# Patient Record
Sex: Female | Born: 1935 | Race: White | Hispanic: No | Smoking: Never smoker
Health system: Southern US, Community
[De-identification: ages and names within clinical notes are randomized; demographics above are authoritative.]

## PROBLEM LIST (undated history)

## (undated) DIAGNOSIS — M8580 Other specified disorders of bone density and structure, unspecified site: Secondary | ICD-10-CM

## (undated) DIAGNOSIS — I4891 Unspecified atrial fibrillation: Secondary | ICD-10-CM

## (undated) DIAGNOSIS — I209 Angina pectoris, unspecified: Secondary | ICD-10-CM

## (undated) DIAGNOSIS — F32A Depression, unspecified: Secondary | ICD-10-CM

## (undated) DIAGNOSIS — M1990 Unspecified osteoarthritis, unspecified site: Secondary | ICD-10-CM

## (undated) DIAGNOSIS — R05 Cough: Secondary | ICD-10-CM

## (undated) DIAGNOSIS — R1310 Dysphagia, unspecified: Secondary | ICD-10-CM

## (undated) DIAGNOSIS — E876 Hypokalemia: Secondary | ICD-10-CM

## (undated) DIAGNOSIS — I10 Essential (primary) hypertension: Secondary | ICD-10-CM

## (undated) DIAGNOSIS — F329 Major depressive disorder, single episode, unspecified: Secondary | ICD-10-CM

## (undated) DIAGNOSIS — R260 Ataxic gait: Secondary | ICD-10-CM

## (undated) DIAGNOSIS — R059 Cough, unspecified: Secondary | ICD-10-CM

## (undated) DIAGNOSIS — H3530 Unspecified macular degeneration: Secondary | ICD-10-CM

## (undated) DIAGNOSIS — E785 Hyperlipidemia, unspecified: Secondary | ICD-10-CM

## (undated) DIAGNOSIS — G250 Essential tremor: Secondary | ICD-10-CM

## (undated) DIAGNOSIS — I214 Non-ST elevation (NSTEMI) myocardial infarction: Secondary | ICD-10-CM

## (undated) DIAGNOSIS — K839 Disease of biliary tract, unspecified: Secondary | ICD-10-CM

## (undated) DIAGNOSIS — K219 Gastro-esophageal reflux disease without esophagitis: Secondary | ICD-10-CM

## (undated) DIAGNOSIS — Z780 Asymptomatic menopausal state: Secondary | ICD-10-CM

## (undated) DIAGNOSIS — F419 Anxiety disorder, unspecified: Secondary | ICD-10-CM

## (undated) DIAGNOSIS — J449 Chronic obstructive pulmonary disease, unspecified: Secondary | ICD-10-CM

## (undated) DIAGNOSIS — R296 Repeated falls: Secondary | ICD-10-CM

## (undated) DIAGNOSIS — I2510 Atherosclerotic heart disease of native coronary artery without angina pectoris: Secondary | ICD-10-CM

## (undated) DIAGNOSIS — R531 Weakness: Secondary | ICD-10-CM

## (undated) HISTORY — DX: Repeated falls: R29.6

## (undated) HISTORY — DX: Non-ST elevation (NSTEMI) myocardial infarction: I21.4

## (undated) HISTORY — DX: Unspecified osteoarthritis, unspecified site: M19.90

## (undated) HISTORY — PX: FOOT SURGERY: SHX648

## (undated) HISTORY — DX: Cough: R05

## (undated) HISTORY — PX: INGUINAL HERNIA REPAIR: SUR1180

## (undated) HISTORY — DX: Cough, unspecified: R05.9

## (undated) HISTORY — DX: Hyperlipidemia, unspecified: E78.5

## (undated) HISTORY — DX: Other specified disorders of bone density and structure, unspecified site: M85.80

## (undated) HISTORY — DX: Essential tremor: G25.0

## (undated) HISTORY — PX: BLADDER SUSPENSION: SHX72

## (undated) HISTORY — DX: Asymptomatic menopausal state: Z78.0

## (undated) HISTORY — DX: Unspecified macular degeneration: H35.30

---

## 1995-07-05 DIAGNOSIS — I214 Non-ST elevation (NSTEMI) myocardial infarction: Secondary | ICD-10-CM

## 1995-07-05 HISTORY — DX: Non-ST elevation (NSTEMI) myocardial infarction: I21.4

## 2000-11-28 ENCOUNTER — Ambulatory Visit (HOSPITAL_COMMUNITY): Admission: RE | Admit: 2000-11-28 | Discharge: 2000-11-28 | Payer: Self-pay | Admitting: Obstetrics and Gynecology

## 2000-11-28 ENCOUNTER — Encounter: Payer: Self-pay | Admitting: Obstetrics and Gynecology

## 2001-01-25 ENCOUNTER — Other Ambulatory Visit: Admission: RE | Admit: 2001-01-25 | Discharge: 2001-01-25 | Payer: Self-pay | Admitting: Obstetrics and Gynecology

## 2001-12-25 ENCOUNTER — Ambulatory Visit (HOSPITAL_COMMUNITY): Admission: RE | Admit: 2001-12-25 | Discharge: 2001-12-25 | Payer: Self-pay | Admitting: Obstetrics and Gynecology

## 2001-12-25 ENCOUNTER — Encounter: Payer: Self-pay | Admitting: Obstetrics and Gynecology

## 2002-12-31 ENCOUNTER — Encounter: Payer: Self-pay | Admitting: Obstetrics and Gynecology

## 2002-12-31 ENCOUNTER — Ambulatory Visit (HOSPITAL_COMMUNITY): Admission: RE | Admit: 2002-12-31 | Discharge: 2002-12-31 | Payer: Self-pay | Admitting: Obstetrics and Gynecology

## 2004-11-05 ENCOUNTER — Ambulatory Visit: Payer: Self-pay | Admitting: Cardiology

## 2005-11-08 ENCOUNTER — Ambulatory Visit: Payer: Self-pay | Admitting: Cardiology

## 2005-11-08 ENCOUNTER — Ambulatory Visit (HOSPITAL_COMMUNITY): Admission: RE | Admit: 2005-11-08 | Discharge: 2005-11-08 | Payer: Self-pay | Admitting: Cardiology

## 2006-11-16 ENCOUNTER — Ambulatory Visit: Payer: Self-pay | Admitting: Cardiology

## 2006-12-25 ENCOUNTER — Ambulatory Visit (HOSPITAL_COMMUNITY): Admission: RE | Admit: 2006-12-25 | Discharge: 2006-12-25 | Payer: Self-pay | Admitting: Cardiology

## 2006-12-25 ENCOUNTER — Ambulatory Visit: Payer: Self-pay | Admitting: Cardiology

## 2006-12-26 ENCOUNTER — Ambulatory Visit: Payer: Self-pay | Admitting: Cardiovascular Disease

## 2006-12-27 ENCOUNTER — Ambulatory Visit (HOSPITAL_COMMUNITY): Admission: RE | Admit: 2006-12-27 | Discharge: 2006-12-27 | Payer: Self-pay | Admitting: Cardiology

## 2007-01-22 ENCOUNTER — Ambulatory Visit: Payer: Self-pay | Admitting: Cardiology

## 2007-01-25 ENCOUNTER — Ambulatory Visit (HOSPITAL_COMMUNITY): Admission: RE | Admit: 2007-01-25 | Discharge: 2007-01-25 | Payer: Self-pay | Admitting: Cardiology

## 2007-04-25 ENCOUNTER — Ambulatory Visit: Payer: Self-pay | Admitting: Cardiology

## 2008-05-01 ENCOUNTER — Ambulatory Visit: Payer: Self-pay | Admitting: Cardiology

## 2008-05-01 ENCOUNTER — Encounter (INDEPENDENT_AMBULATORY_CARE_PROVIDER_SITE_OTHER): Payer: Self-pay | Admitting: *Deleted

## 2008-05-01 LAB — CONVERTED CEMR LAB
ALT: 10 units/L
AST: 18 units/L
Albumin: 4.2 g/dL
Alkaline Phosphatase: 95 units/L
BUN: 21 mg/dL
CO2: 25 meq/L
Calcium: 9.4 mg/dL
Chloride: 103 meq/L
Creatinine, Ser: 0.45 mg/dL
Glucose, Bld: 87 mg/dL
HCT: 37.7 %
Hemoglobin: 11.9 g/dL
MCV: 102.4 fL
Platelets: 361 10*3/uL
Potassium: 4.1 meq/L
Sodium: 141 meq/L
TSH: 2.877 microintl units/mL
Total Protein: 6.6 g/dL
WBC: 6.2 10*3/uL

## 2009-05-04 ENCOUNTER — Encounter: Payer: Self-pay | Admitting: *Deleted

## 2009-05-04 DIAGNOSIS — I10 Essential (primary) hypertension: Secondary | ICD-10-CM | POA: Insufficient documentation

## 2009-05-04 DIAGNOSIS — M899 Disorder of bone, unspecified: Secondary | ICD-10-CM | POA: Insufficient documentation

## 2009-05-04 DIAGNOSIS — M949 Disorder of cartilage, unspecified: Secondary | ICD-10-CM

## 2009-05-04 DIAGNOSIS — G250 Essential tremor: Secondary | ICD-10-CM | POA: Insufficient documentation

## 2009-05-04 DIAGNOSIS — M1990 Unspecified osteoarthritis, unspecified site: Secondary | ICD-10-CM | POA: Insufficient documentation

## 2009-05-04 DIAGNOSIS — K219 Gastro-esophageal reflux disease without esophagitis: Secondary | ICD-10-CM

## 2009-05-07 ENCOUNTER — Encounter (INDEPENDENT_AMBULATORY_CARE_PROVIDER_SITE_OTHER): Payer: Self-pay | Admitting: *Deleted

## 2009-05-07 ENCOUNTER — Ambulatory Visit: Payer: Self-pay | Admitting: Cardiology

## 2009-05-07 LAB — CONVERTED CEMR LAB
AST: 20 units/L (ref 0–37)
Albumin: 4.5 g/dL
Albumin: 4.5 g/dL
Alkaline Phosphatase: 53 units/L
BUN: 14 mg/dL (ref 6–23)
CO2: 27 meq/L
CO2: 27 meq/L
Calcium: 9.6 mg/dL
Calcium: 9.6 mg/dL (ref 8.4–10.5)
Chloride: 102 meq/L (ref 96–112)
Cholesterol: 144 mg/dL
Cholesterol: 144 mg/dL
Cholesterol: 144 mg/dL (ref 0–200)
Creatinine, Ser: 0.55 mg/dL (ref 0.40–1.20)
Glucose, Bld: 80 mg/dL
HDL: 54 mg/dL (ref >39)
LDL Cholesterol: 67 mg/dL
Potassium: 4.1 meq/L
Sodium: 143 meq/L
Sodium: 143 meq/L
Total Bilirubin: 0.5 mg/dL (ref 0.3–1.2)
Total CHOL/HDL Ratio: 2.7
Total Protein: 6.7 g/dL
VLDL: 23 mg/dL (ref 0–40)

## 2009-05-11 ENCOUNTER — Encounter (INDEPENDENT_AMBULATORY_CARE_PROVIDER_SITE_OTHER): Payer: Self-pay | Admitting: *Deleted

## 2009-08-05 ENCOUNTER — Ambulatory Visit: Payer: Self-pay | Admitting: Cardiology

## 2009-08-05 ENCOUNTER — Encounter: Payer: Self-pay | Admitting: Cardiology

## 2009-08-06 ENCOUNTER — Encounter: Payer: Self-pay | Admitting: Cardiology

## 2009-08-11 ENCOUNTER — Encounter (INDEPENDENT_AMBULATORY_CARE_PROVIDER_SITE_OTHER): Payer: Self-pay | Admitting: *Deleted

## 2009-08-11 LAB — CONVERTED CEMR LAB
ALT: 63 units/L
AST: 32 units/L
GFR calc non Af Amer: >60 mL/min
Glomerular Filtration Rate, Af Am: >60 mL/min/{1.73_m2}
Glucose, Bld: 102 mg/dL

## 2009-08-12 ENCOUNTER — Encounter (INDEPENDENT_AMBULATORY_CARE_PROVIDER_SITE_OTHER): Payer: Self-pay | Admitting: *Deleted

## 2009-08-12 LAB — CONVERTED CEMR LAB
ALT: 48 units/L
AST: 22 units/L
Albumin: 1.8 g/dL
Alkaline Phosphatase: 1 units/L
Chloride: 99 meq/L
Potassium: 3.4 meq/L
Sodium: 136 meq/L
Total Protein: 4.6 g/dL

## 2009-08-14 ENCOUNTER — Encounter (INDEPENDENT_AMBULATORY_CARE_PROVIDER_SITE_OTHER): Payer: Self-pay | Admitting: *Deleted

## 2009-08-28 ENCOUNTER — Encounter: Payer: Self-pay | Admitting: Cardiology

## 2009-08-28 ENCOUNTER — Encounter (INDEPENDENT_AMBULATORY_CARE_PROVIDER_SITE_OTHER): Payer: Self-pay | Admitting: *Deleted

## 2010-05-03 ENCOUNTER — Encounter (INDEPENDENT_AMBULATORY_CARE_PROVIDER_SITE_OTHER): Payer: Self-pay | Admitting: *Deleted

## 2010-05-07 ENCOUNTER — Ambulatory Visit: Payer: Self-pay | Admitting: Cardiology

## 2010-05-10 DIAGNOSIS — K839 Disease of biliary tract, unspecified: Secondary | ICD-10-CM | POA: Insufficient documentation

## 2010-05-12 ENCOUNTER — Encounter: Payer: Self-pay | Admitting: Cardiology

## 2010-05-13 ENCOUNTER — Ambulatory Visit: Payer: Self-pay | Admitting: Internal Medicine

## 2010-06-10 ENCOUNTER — Ambulatory Visit (HOSPITAL_COMMUNITY)
Admission: RE | Admit: 2010-06-10 | Discharge: 2010-06-10 | Payer: Self-pay | Source: Home / Self Care | Attending: Internal Medicine | Admitting: Internal Medicine

## 2010-06-10 ENCOUNTER — Ambulatory Visit: Payer: Self-pay | Admitting: Internal Medicine

## 2010-07-25 ENCOUNTER — Encounter: Payer: Self-pay | Admitting: Family Medicine

## 2010-07-25 ENCOUNTER — Telehealth: Payer: Self-pay | Admitting: Cardiology

## 2010-08-05 NOTE — Letter (Signed)
Summary: CHEST X-RAY  CHEST X-RAY   Imported By: Faythe Ghee 08/28/2009 10:32:11  _____________________________________________________________________  External Attachment:    Type:   Image     Comment:   External Document

## 2010-08-05 NOTE — Letter (Signed)
Summary: [facility]   labs [facility]   Imported By: Faythe Ghee 05/12/2010 11:50:16  _____________________________________________________________________  External Attachment:    Type:   Image     Comment:   External Document

## 2010-08-05 NOTE — Letter (Signed)
Summary: US ABDOMINAL COMPLETE  US ABDOMINAL COMPLETE   Imported By: Faythe Ghee 08/28/2009 10:27:02  _____________________________________________________________________  External Attachment:    Type:   Image     Comment:   External Document

## 2010-08-05 NOTE — Letter (Signed)
Summary: HOSPITAL [facility] CONSULATION   Imported By: Faythe Ghee 08/19/2009 15:16:19  _____________________________________________________________________  External Attachment:    Type:   Image     Comment:   External Document

## 2010-08-05 NOTE — Miscellaneous (Signed)
Summary: labs cmp,lipids 05/07/2009  Clinical Lists Changes  Observations: Added new observation of CALCIUM: 9.6 mg/dL (04/54/0981 19:14) Added new observation of ALBUMIN: 4.5 g/dL (78/29/5621 30:86) Added new observation of PROTEIN, TOT: 6.7 g/dL (57/84/6962 95:28) Added new observation of SGPT (ALT): 12 units/L (05/07/2009 15:10) Added new observation of SGOT (AST): 20 units/L (05/07/2009 15:10) Added new observation of ALK PHOS: 53 units/L (05/07/2009 15:10) Added new observation of CREATININE: 0.55 mg/dL (41/32/4401 02:72) Added new observation of BUN: 14 mg/dL (53/66/4403 47:42) Added new observation of BG RANDOM: 80 mg/dL (59/56/3875 64:33) Added new observation of CO2 PLSM/SER: 27 meq/L (05/07/2009 15:10) Added new observation of CL SERUM: 102 meq/L (05/07/2009 15:10) Added new observation of K SERUM: 4.1 meq/L (05/07/2009 15:10) Added new observation of NA: 143 meq/L (05/07/2009 15:10) Added new observation of LDL: 67 mg/dL (29/51/8841 66:06) Added new observation of HDL: 54 mg/dL (30/16/0109 32:35) Added new observation of TRIGLYC TOT: 117 mg/dL (57/32/2025 42:70) Added new observation of CHOLESTEROL: 144 mg/dL (62/37/6283 15:17)

## 2010-08-05 NOTE — Miscellaneous (Signed)
Summary: labs 08/12/2009  Clinical Lists Changes  Observations: Added new observation of CALCIUM: 7.6 mg/dL (16/04/9603 5:40) Added new observation of ALBUMIN: 1.8 g/dL (98/05/9146 8:29) Added new observation of PROTEIN, TOT: 4.6 g/dL (56/21/3086 5:78) Added new observation of SGPT (ALT): 48 units/L (08/12/2009 9:06) Added new observation of SGOT (AST): 22 units/L (08/12/2009 9:06) Added new observation of ALK PHOS: 01 units/L (08/12/2009 9:06) Added new observation of GFR AA: >60 mL/min/1.29m2 (08/12/2009 9:06) Added new observation of GFR: >60 mL/min (08/12/2009 9:06) Added new observation of CREATININE: 0.20 mg/dL (46/96/2952 8:41) Added new observation of BUN: 4 mg/dL (32/44/0102 7:25) Added new observation of BG RANDOM: 96 mg/dL (36/64/4034 7:42) Added new observation of CO2 PLSM/SER: 27.0 meq/L (08/12/2009 9:06) Added new observation of CL SERUM: 99 meq/L (08/12/2009 9:06) Added new observation of K SERUM: 3.4 meq/L (08/12/2009 9:06) Added new observation of NA: 136 meq/L (08/12/2009 9:06) Added new observation of CALCIUM: 7.5 mg/dL (59/56/3875 6:43) Added new observation of ALBUMIN: 1.7 g/dL (32/95/1884 1:66) Added new observation of PROTEIN, TOT: 4.4 g/dL (01/01/1600 0:93) Added new observation of SGPT (ALT): 63 units/L (08/11/2009 9:06) Added new observation of SGOT (AST): 32 units/L (08/11/2009 9:06) Added new observation of ALK PHOS: 76 units/L (08/11/2009 9:06) Added new observation of GFR AA: >60 mL/min/1.62m2 (08/11/2009 9:06) Added new observation of GFR: >60 mL/min (08/11/2009 9:06) Added new observation of CREATININE: 0.30 mg/dL (23/55/7322 0:25) Added new observation of BUN: 6 mg/dL (42/70/6237 6:28) Added new observation of BG RANDOM: 102 mg/dL (31/51/7616 0:73) Added new observation of CL SERUM: 100 meq/L (08/11/2009 9:06) Added new observation of K SERUM: 3.6 meq/L (08/11/2009 9:06)

## 2010-08-05 NOTE — Letter (Signed)
Summary: ECHO  ECHO   Imported By: Faythe Ghee 08/28/2009 10:26:11  _____________________________________________________________________  External Attachment:    Type:   Image     Comment:   External Document

## 2010-08-05 NOTE — Letter (Signed)
Summary: ekg  ekg   Imported By: Faythe Ghee 05/12/2010 11:51:40  _____________________________________________________________________  External Attachment:    Type:   Image     Comment:   External Document

## 2010-08-05 NOTE — Letter (Signed)
Summary: ABDOMEN 2 VIEW  ABDOMEN 2 VIEW   Imported By: Faythe Ghee 08/28/2009 10:33:22  _____________________________________________________________________  External Attachment:    Type:   Image     Comment:   External Document

## 2010-08-05 NOTE — Letter (Signed)
Summary: Discharge Summary  Discharge Summary   Imported By: Faythe Ghee 08/19/2009 15:16:39  _____________________________________________________________________  External Attachment:    Type:   Image     Comment:   External Document

## 2010-08-05 NOTE — Assessment & Plan Note (Signed)
Summary: 1 YR F/U PER CHECKOUT ON 05/07/09/TG   Visit Type:  Follow-up Primary Provider:  Dr.Sasser   History of Present Illness: Ms. Candice Meza returns to the office for continued assessment and treatment of possible coronary artery disease and multiple cardiovascular risk factors.  Since I last saw her one year ago, she was admitted to [facility] in February with an Escherichia coli urinary tract infection.  Hospital records were obtained and reviewed.  She also reported chest discomfort and was found to have very mildly elevated troponin peaking at 0.23 with normal renal function.  A question of paroxysmal atrial fibrillation was raised on monitoring, but apparently not documented.  Echocardiogram showed normal EF.  BNP level was 500 with an initial potassium of 2.7.  Chest x-ray showed atelectasis and possible infiltrate at the right base with right pleural effusion, and she was also treated for her chronic obstructive pulmonary disease/acute bronchitis.  A significant anemia was also present.  She subsequently was transferred to the [facility], where she remains.  She has been doing fairly well functionally, but does have some dyspnea on exertion.  She is able to walk independently with a walker.  She denies chest discomfort, lightheadedness, syncope, orthopnea and PND.  Current Medications (verified): 1)  Nabumetone 500 Mg Tabs (Nabumetone) .... Take 1 Tab Two Times A Day 2)  Aspir-Low 81 Mg Tbec (Aspirin) .... Take 1 Tab Daily 3)  Klor-Con 10 10 Meq Cr-Tabs (Potassium Chloride) .... Take 1 Tab Daily 4)  Calcium 600 1500 Mg Tabs (Calcium Carbonate) .... Take 1 Tab Daily 5)  Nitrostat 0.4 Mg Subl (Nitroglycerin) .... Take As Directed For Chestpain 6)  Tylenol 8 Hour 650 Mg Cr-Tabs (Acetaminophen) .... Take As Needed 7)  Alprazolam 0.25 Mg Tabs (Alprazolam) .... Take 1 Tab Daily 8)  Fluticasone Propionate 50 Mcg/act Susp (Fluticasone Propionate) .... 2 Sprays Each Nostril  Daily 9)  Tozal  Caps (Dietary Management Product) .... 3 Caps At Breakfast Daily 10)  Citalopram Hydrobromide 20 Mg Tabs (Citalopram Hydrobromide) .... Take 1 Tab Daily 11)  Cetirizine Hcl 10 Mg Tabs (Cetirizine Hcl) .... Take 1 Tab Daily 12)  Vitamin E 400 Unit Caps (Vitamin E) .... Take 1 Tab Daily 13)  Daily Multi  Tabs (Multiple Vitamins-Minerals) .... Take 1 Tab Daily 14)  Benefiber  Powd (Wheat Dextrin) .... 2 Tablespoons Two Times A Day 15)  Systane 0.4-0.3 % Soln (Polyethyl Glycol-Propyl Glycol) .[name].. 1 Drop Each Eye Three Times A Day 16)  Propranolol Hcl 20 Mg/31ml Soln (Propranolol Hcl) .... Take 1 Tab Qid 17)  Omeprazole 40 Mg Cpdr (Omeprazole) .... Take 1 Tab Daily 18)  Resource Juice Drink (Frozen) Liqd (Nutritional Supplements) .[name].. 1 Juice Daily 19)  Delsym 30 Mg/97ml Lqcr (Dextromethorphan Polistirex) .... Take Prn 20)  Mucinex 600 Mg Xr12h-Tab (Guaifenesin) .... Take Prn 21)  Ambien 5 Mg Tabs (Zolpidem Tartrate) .... Take 1 Tab Qhs 22)  Tramadol Hcl 50 Mg Tabs (Tramadol Hcl) .... Take As Needed For Pain 23)  Phenergan 25 Mg/ml Soln (Promethazine Hcl) .... Use As Needed For Nausea 24)  Pravastatin Sodium 40 Mg Tabs (Pravastatin Sodium) .... Take 1 Tablet By Mouth At Bedtime  Allergies (verified): No Known Drug Allergies  Comments:  Nurse/Medical Assistant: brought med list from [facility] [place]  PFT  Procedure date:  03/09/2010  Findings:      FVC-72% of predicted FEV1: 71% of predicted  Mild and primarily small airways obstruction   Past History:  PMH, FH,  and Social History reviewed and updated.  Review of Systems       See history of present illness.  Vital Signs:  Patient profile:   77 year old female Weight:      119 pounds BMI:     21.84 O2 Sat:      92 % on Room air Pulse rate:   48 / minute BP sitting:   112 / 55  (right arm)  Vitals Entered By: Dreama Saa, CNA (May 07, 2010 1:19 PM)  O2 Flow:  Room air  Physical  Exam  General:  Well developed; no acute distress: pallor noted; mentally sharp, pleasant and conversant.   Neck-No JVD; no carotid bruits: Lungs-No tachypnea, few coarse rales; no rhonchi; no wheezes; decreased breath sounds at the bases Cardiovascular-normal PMI; normal S1 and S2; grade 2/6 systolic ejection murmur; grade 1/6 diastolic decrescendo murmur Abdomen-BS normal; soft and non-tender without masses or organomegaly:  Musculoskeletal-deformities of all of the DIP joints, no cyanosis or clubbing: Neurologic-Normal cranial nerves; symmetric strength and tone:  Skin-Warm, no significant lesions: Extremities- trace edema; 1+ distal pulses; mild tremors; Heberden's nodes     Impression & Recommendations:  Problem # 1:  ATHEROSCLEROTIC CARDIOVASCULAR DISEASE (ICD-429.2) Patient's only catheterization in 1997 showed equivocal results for coronary disease.  Similarly, the myocardial infarction diagnosed during her recent hospitalization was questionable.   No further assessment or treatment is necessary at present  Problem # 2:  ATRIAL FIBRILLATION-PAROXYSMAL (ICD-427.31) I have no documentation of her arrhythmias in the hospital.  These were most likely related to her acute illness and may not recur.  She is a very suboptimal candidate for anticoagulation.  No further evaluation or treatment of this problem will be undertaken at present.  Problem # 3:  HYPERLIPIDEMIA (ICD-272.4) Rosuvastatin was discontinued in hospital due to LFT abnormalities.  We will attempt to reintroduce a statin and follow both LFTs and lipids.  Problem # 4:  HYPERTENSION (ICD-401.9) Blood pressure control is good, and current medications will be continued.  Patient Instructions: 1)  Your physician recommends that you schedule a follow-up appointment in: 9 months 2)  Your physician has recommended you make the following change in your medication: Stop taking Coreg and start taking Pravachol 40mg  by mouth at  bedtime  3)  ***Please elevate legs while seated*** Prescriptions: PRAVASTATIN SODIUM 40 MG TABS (PRAVASTATIN SODIUM) take 1 tablet by mouth at bedtime  #30 x 8   Entered by:   Larita Fife Via LPN   Authorized by:   Kathlen Brunswick, MD, [facility]   Signed by:   Larita Fife Via LPN on 11/91/4782   Method used:   Faxed to ...       [facility] (mail-order)       [address]       [place], [place]  [address]       Ph: [phone] or [phone]       Fax: [phone]   RxID:   [id number]

## 2010-08-05 NOTE — Miscellaneous (Signed)
Summary: labs cmp,lipids 05/07/2009  Clinical Lists Changes  Observations: Added new observation of CALCIUM: 9.6 mg/dL (69/62/9528 4:13) Added new observation of ALBUMIN: 4.5 g/dL (24/40/1027 2:53) Added new observation of PROTEIN, TOT: 6.7 g/dL (66/44/0347 4:25) Added new observation of SGPT (ALT): 12 units/L (05/07/2009 9:47) Added new observation of SGOT (AST): 20 units/L (05/07/2009 9:47) Added new observation of ALK PHOS: 53 units/L (05/07/2009 9:47) Added new observation of CREATININE: 0.55 mg/dL (95/63/8756 4:33) Added new observation of BUN: 14 mg/dL (29/51/8841 6:60) Added new observation of BG RANDOM: 80 mg/dL (63/07/6008 9:32) Added new observation of CO2 PLSM/SER: 27 meq/L (05/07/2009 9:47) Added new observation of CL SERUM: 102 meq/L (05/07/2009 9:47) Added new observation of K SERUM: 4.1 meq/L (05/07/2009 9:47) Added new observation of NA: 143 meq/L (05/07/2009 9:47) Added new observation of LDL: 67 mg/dL (35/57/3220 2:54) Added new observation of HDL: 54 mg/dL (27/12/2374 2:83) Added new observation of TRIGLYC TOT: 117 mg/dL (15/17/6160 7:37) Added new observation of CHOLESTEROL: 144 mg/dL (10/62/6948 5:46)

## 2010-08-05 NOTE — Letter (Signed)
Summary: [facility]   Imported By: Faythe Ghee 05/12/2010 11:54:36  _____________________________________________________________________  External Attachment:    Type:   Image     Comment:   External Document

## 2010-08-05 NOTE — Letter (Signed)
Summary: echo 08/06/09  echo 08/06/09   Imported By: Faythe Ghee 05/12/2010 11:51:20  _____________________________________________________________________  External Attachment:    Type:   Image     Comment:   External Document

## 2010-08-05 NOTE — Letter (Signed)
Summary: chest x ray 08/10/09  chest x ray 08/10/09   Imported By: Faythe Ghee 05/12/2010 11:50:48  _____________________________________________________________________  External Attachment:    Type:   Image     Comment:   External Document

## 2010-08-10 ENCOUNTER — Other Ambulatory Visit: Payer: Self-pay | Admitting: Cardiology

## 2010-08-10 DIAGNOSIS — Z01818 Encounter for other preprocedural examination: Secondary | ICD-10-CM

## 2010-08-13 ENCOUNTER — Encounter: Payer: Self-pay | Admitting: Cardiology

## 2010-08-13 ENCOUNTER — Encounter (HOSPITAL_COMMUNITY): Payer: Self-pay

## 2010-08-13 ENCOUNTER — Encounter (HOSPITAL_COMMUNITY)
Admission: RE | Admit: 2010-08-13 | Discharge: 2010-08-13 | Disposition: A | Discharge status: Home / Self Care | Payer: Medicare Other | Source: Ambulatory Visit | Attending: Cardiology | Admitting: Cardiology

## 2010-08-13 ENCOUNTER — Encounter (HOSPITAL_COMMUNITY): Payer: Medicare Other

## 2010-08-13 ENCOUNTER — Encounter (INDEPENDENT_AMBULATORY_CARE_PROVIDER_SITE_OTHER): Payer: Medicare Other

## 2010-08-13 DIAGNOSIS — R079 Chest pain, unspecified: Secondary | ICD-10-CM

## 2010-08-13 DIAGNOSIS — Z01818 Encounter for other preprocedural examination: Secondary | ICD-10-CM

## 2010-08-13 DIAGNOSIS — R0789 Other chest pain: Secondary | ICD-10-CM | POA: Insufficient documentation

## 2010-08-13 DIAGNOSIS — I2510 Atherosclerotic heart disease of native coronary artery without angina pectoris: Secondary | ICD-10-CM | POA: Insufficient documentation

## 2010-08-13 DIAGNOSIS — D66 Hereditary factor VIII deficiency: Secondary | ICD-10-CM

## 2010-08-13 HISTORY — DX: Essential (primary) hypertension: I10

## 2010-08-13 MED ORDER — TECHNETIUM TC 99M TETROFOSMIN IV KIT
30.0000 | PACK | Freq: Once | INTRAVENOUS | Status: AC | PRN
  Administered 2010-08-13: 28.9 via INTRAVENOUS

## 2010-08-13 MED ORDER — TECHNETIUM TC 99M TETROFOSMIN IV KIT
10.0000 | PACK | Freq: Once | INTRAVENOUS | Status: AC | PRN
  Administered 2010-08-13: 10.2 via INTRAVENOUS

## 2010-08-19 NOTE — Progress Notes (Signed)
Summary: Test Scheduled  Phone Note From Other Clinic   Caller: Dr. Jenne Pane- ENT  [phone] Call For: Dr. Dietrich Pates Details for Reason: Patient discussion Summary of Call: Pt. has a Zenker's Diverticulum and requires repair under general anesthesia.  I advised a stress test prior to surgery and will have our staff set up a lexiscan myoview study. Initial call taken by: Kathlen Brunswick, MD, [facility],  July 25, 2010 10:52 PM  Follow-up for Phone Call        patient scheduled for 08/11/10 / [facility] / tg Follow-up by: Raechel Ache [facility],  August 06, 2010 11:38 AM

## 2010-09-21 ENCOUNTER — Telehealth: Payer: Self-pay | Admitting: Cardiology

## 2010-09-21 NOTE — Telephone Encounter (Signed)
Patient is wanting to know id she has been cleared to have gland surgery. She has never heard back from stress test.

## 2010-11-16 NOTE — Procedures (Signed)
[name], Candice Meza             ACCOUNT NO.:  [id number]   MEDICAL RECORD NO.:  [id number]          PATIENT TYPE:  OUT   LOCATION:  RAD                           FACILITY:  [facility]   PHYSICIAN:  Noralyn Pick. [place] Emms, MD, FACCDATE OF BIRTH:  28-Feb-1922   DATE OF PROCEDURE:  12/27/2006  DATE OF DISCHARGE:                                ECHOCARDIOGRAM   INDICATION:  History of myocardial infarction, hypertension, check left  ventricular function.   Left ventricular cavity size was mildly dilated.  There were no regional  wall motion abnormalities.  There was mild LVH.  Ejection fraction was  60%.  Mitral valve was mildly thickened without significant MR.  There  was mild biatrial enlargement.  Right ventricle was normal with mild TR  and no evidence of pulmonary hypertension.  Aortic valve was slightly  thin and sclerotic.  There was no stenosis.  There was mild aortic  insufficiency.  Subcostal imaging revealed no atrial or septal defect.  No source of embolus.  No effusion.   M-mode measurement showed an aortic dimension of 35 mm, left atrial  dimension of 41 mm.  Septal thickness 12 mm.  LV diastolic dimension 36  mm.  LV systolic dimension 27 mm.   FINAL IMPRESSION:  1. Mild left ventricular cavity enlargement and mild left ventricular      hypertrophy.  Ejection fraction of 60%.  2. Aortic valve sclerosis with mild aortic insufficiency.  3. Normal mitral valve.  4. Mild biatrial enlargement.  5. Normal right ventricle.  6. No pericardial effusion.      Noralyn Pick. Eden Emms, MD, [facility]  Electronically Signed     PCN/MEDQ  D:  12/27/2006  T:  12/28/2006  Job:  [id number]   cc:   Fara Chute  Fax: [phone]   Gerrit Friends. Dietrich Pates, MD, [facility]  [address]  [place], [place] [address]

## 2010-11-16 NOTE — Letter (Signed)
April 25, 2007    Fara Chute, MD  [address]  [place], [place] [address]   RE:  Candice Meza, Candice Meza  MRN:  [id number]  /  DOB:  02-04-1922   Dear Dr. Neita Carp:   Candice Meza return to the office for continued assessment and  treatment of dyspnea.  She is fairly satisfied with her current level of  function but continues to experience breathlessness with one flight of  stairs.  She rests and promptly recovers.  She has PFTs that were  remarkably good with minimal obstruction and normal diffusing capacity  of carbon monoxide and a normal arterial blood gas.  Her BNP level was  normal.  The lipid profile off treatment was suboptimal, as you might  expect.  Apparently, you have recommended that she take Crestor 2.5 mg  daily.   PHYSICAL EXAMINATION:  On exam, a very pleasant older woman in no acute  distress.  The weight is 117, 2 pounds less than three months ago.  Blood pressure  105/70, heart rate 62 and regular, respirations 16.  NECK:  No jugular venous distention; no carotid bruits.  LUNGS:  Clear.  CARDIAC:  Modest systolic ejection murmur; normal first and second heart  sounds.  ABDOMEN:  Soft and nontender; no masses, no organomegaly.  EXTREMITIES:  Ankle edema 1+.   IMPRESSION:  Candice Meza has mild dyspnea that is probably related to  physical deconditioning due to orthopedic problems and tremor.  Her gait  is unstable.  She has suffered falls in the past.  I encouraged her to  be as active as possible but only to walk on level ground  and generally with assistance.  I do not believe that any other  interventions are necessary.  It would be somewhat beneficial for her to  take a lipid-lowering agent.  I would suggest a generic, such as  simvastatin, rather than Crestor, which will cost her 10 times as much  at the pharmacy.  I will plan to see this nice woman again in one year.    Sincerely,      Gerrit Friends. Dietrich Pates, MD, [facility]  Electronically Signed    RMR/MedQ  DD: 04/25/2007  DT: 04/26/2007  Job #: [id number]

## 2010-11-16 NOTE — Letter (Signed)
January 22, 2007    Candice Meza  [address] [place], [place] [address]   RE:  Candice, Meza  MRN:  [id number]  /  DOB:  [birth year]   Dear Dr. Neita Carp:   Candice Meza returns to the office for continued assessment and  treatment of a history of myocardial infarction and now exertional  intolerance.  She feels somewhat better since I last saw her, but  continues to have trouble walking short distances or doing any  significant housework.  She had a D-dimer level that was slightly  elevated at 0.7.  A metabolic profile was performed instead of a BNP  level.  The former was normal.  She had a CBC that was normal, a  chemistry profile that was normal and a TSH that was normal.   With closer questioning, it seems that Candice Meza has had some lung  issues over the years.  She was exposed to a great deal of passive  smoke.  She previously was evaluated by Dr. Juanetta Gosling.  She has had  chronic cough and multiple episodes of pneumonia.  She was once told  that one of her lungs was not elastic enough.   CURRENT MEDICATIONS:  1. KCL 10 mEq daily.  2. Aspirin 81 mg daily.  3. Nexium 40 mg daily.  4. Calcium with Vitamin D.  5. Furosemide 40 mg daily.  6. Nabumetone 500 mg b.i.d.  7. Propranolol 10 mg t.i.d.   On exam, a trim pleasant woman in no acute distress.  The weight is 119,  one pound less than last month.  Blood pressure 130/70, heart rate 66  and regular, respirations 18.  NECK:  No jugular venous distention; normal carotid upstrokes without  bruits.  LUNGS:  Clear; resonant to percussion; no marked scoliosis appreciated  on exam, but she does have an exaggerated lumbar lordosis.  ABDOMEN:  Soft and nontender; no organomegaly.  CARDIAC:  Fourth heart sound present; normal first and second heart  sounds.  EXTREMITIES:  Trace ankle edema; distal pulses intact.   IMPRESSION:  Candice Meza remains symptomatic.  It sounds as if she  may have longstanding asthmatic  bronchitis, which could account for her  current exertional dyspnea.  She has been treated with inhalers in the  past without benefit.  We will obtain pulmonary function testing with  measurement of an arterial blood gas.  This may not lead to any  therapeutic approaches, but it will be useful to know whether abnormal  lung function accounts for her current symptoms.  A BNP level will be  obtained as ordered.  I do not think we need to rule out chronic  pulmonary embolism, based upon a minimally elevated D-dimer level.  [name] will remain off Toprol and off atorvastatin since this has  seemed to help.  I will see this nice woman again in 3 months.    Sincerely,      Gerrit Friends. Dietrich Pates, MD, [facility]  Electronically Signed    RMR/MedQ  DD: 01/22/2007  DT: 01/22/2007  Job #: [id number]

## 2010-11-16 NOTE — Letter (Signed)
Nov 16, 2006    Dr. Fara Chute  [address]  [place], [place] [address]   RE:  Candice, Meza  MRN:  [id number]  /  DOB:  [birth year]   Dear Dr. Neita Carp:   Candice Meza return to the office for continued assessment and  treatment of coronary disease.  She really has not had any problems with  her heart since a non-Q myocardial infarction in 1997.  She has  dyslipidemia that has been extremely well controlled under your care.  She now reports vague symptoms, including malaise, palpitations, and  weakness.  Hypertension has been under good control.  She was evaluated  by Dr. Sandria Manly last year for a tremor.  He added propranolol at a low dose  to her medical therapy, which has made a world of difference.  She has  an intermittent cough that she attributes to allergies, and that is not  particularly troublesome for her.  She has had progressive impairment of  vision related to macular degeneration.   She has multiple allergies, including two SULFA DRUGS, PENICILLIN, and  TETRACYCLINE.   CURRENT MEDICATIONS:  1. Toprol 25 mg daily.  2. KCL 10 mEq daily.  3. Aspirin 81 mg daily.  4. Nexium 40 mg daily.  5. Atorvastatin 10 mg daily.  6. Calcium with vitamin D.  7. Furosemide 40 mg daily.  8. Nabumetone 500 mg b.i.d.  9. Propranolol 10 mg t.i.d.   PHYSICAL EXAMINATION:  GENERAL:  A pleasant, very sharp older woman in  no acute distress.  VITAL SIGNS:  Weight is 120, stable.  Blood pressure 125/75, heart rate  52 and regular, respirations 16.  NECK:  No jugular venous distention; normal carotid upstrokes without  bruits.  SKIN:  Pallor.  HEENT:  Anicteric sclerae; normal oral mucosa.  LUNGS:  A few left basilar rales with decreased breath sounds.  CARDIAC:  Normal first and second heart sounds; fourth heart sound and  modest systolic murmur present.  ABDOMEN:  Soft and nontender; no organomegaly; no masses.  EXTREMITIES:  Ankle edema, 1/2+.  Distal pulses intact.   EKG:  Sinus  bradycardia; normal intervals; borderline low voltage;  nonspecific ST segment abnormalities with ST segment coving in the  anterolateral leads; markedly delayed RV progression.  When compared to  a previous tracing of October 14, 2003, there is little interval change.  ST segment abnormalities are somewhat more prominent on the present  tracing, and R wave progression is worse.   IMPRESSION:  Candice Meza is doing well overall.  She does have a  component of sick sinus syndrome exacerbated by beta blockers.  Since  the propranolol is helping her so much, we will stop her Toprol and hope  that is adequate.   She is somewhat concerned about palpitations.  These range from a  momentary irregularity to either recurrent or repetitive abnormalities  that persist over a considerable period of time.  While I think that a  significant arrhythmia is unlikely, we will proceed with event  recording.  Laboratory to monitor her therapy, including a CBC,  metabolic profile, and TSH as well as to exclude a cause of weakness and  malaise will be obtained.  I will reassess this nice woman in one month.    Sincerely,      Gerrit Friends. Dietrich Pates, MD, [facility]    RMR/MedQ  DD: 11/16/2006  DT: 11/16/2006  Job #: [id number]

## 2010-11-16 NOTE — Letter (Signed)
December 25, 2006    Fara Chute  [address] [place], [place] [address]   RE:  LANDRY, LOOKINGBILL  MRN:  [id number]  /  DOB:  1921/11/13   Dear Dr. Neita Carp:   Ms. Isola returns to the office for continued assessment and  treatment of fatigue and dyspnea.  Basic laboratory studies were  unremarkable, including a normal CBC, normal chemistry profile and  normal TSH.  Despite discontinuing metoprolol, she reports being no  better.  She is able to walk only one half flight of stairs without  resting.  Nonetheless, she does considerable housework during the day.  She describes herself as a people person, but due to her decreased  vision, she is alone most of the day.  She does not report vegetative  depressive signs nor symptoms, except anorexia without significant  weight loss.   MEDICATIONS:  Unchanged from her last visit, except for the  discontinuation of metoprolol.   EXAMINATION:  GENERAL:  On exam, thin pleasant woman in no acute  distress.  VITAL SIGNS:  Her weight is 120, unchanged.  Blood pressure 110/70,  heart rate 75 and regular, respirations 16.  NECK:  No jugular venous distention; no carotid bruits.  LUNGS:  Clear.  Oxygen saturation 92% on room air.  CARDIAC:  Normal first and second heart sounds; modest systolic ejection  murmur.  Fourth heart sound present.  ABDOMEN:  Soft and nontender; no masses; normal bowel sounds; no  organomegaly.  EXTREMITIES:  1+ ankle edema.   IMPRESSION:  Mrs. Bilger is now complaining of more prominent  dyspnea associated with her previous complaints of chronic fatigue and  exercise intolerance.  We will proceed with evaluation, initially to  include chest x-ray, echocardiography, BNP level and D-dimer.  I will  ask her to hold a Atorvastatin, in case that is contributing to her  symptoms and plan to re-evaluate this nice woman in one month.    Sincerely,      Gerrit Friends. Dietrich Pates, MD, [facility]  Electronically Signed    RMR/MedQ  DD: 12/25/2006  DT: 12/25/2006  Job #: [id number]

## 2010-11-16 NOTE — Assessment & Plan Note (Signed)
[facility]                       [place] [facility] OFFICE NOTE   NAME:PENNINGTONMalay, Fantroy                    MRN:          [id number]  DATE:05/01/2008                            DOB:          21-Sep-1921    Ms. Cansler returns to the office for continued assessment and  treatment of hypertension and a history of chest discomfort.  Over the  past year, she has done well from a medical standpoint.  She reports no  chest pain nor dyspnea.  Activity is limited due to markedly impaired  vision.  She also has DJD and osteopenia.  She suffered a severe fall  approximately 3 weeks ago evaluated in the emergency department at  [facility].  No significant injuries were identified.  She has had marked  soreness, particularly in her left leg, which was bruised.  The symptoms  are improving.   MEDICATIONS:  Unchanged from the last year.   PHYSICAL EXAMINATION:  GENERA:  Pleasant older woman in no acute  distress.  VITAL SIGNS:  The weight is 118, 3 pounds less than in May 2007, and 13  pounds less than in 2006.  Blood pressure 100/70, heart rate 65 and  regular, respirations 14.  NECK:  No jugular venous distention; no carotid bruits.  LUNGS:  Coarse breath sounds at the bases.  CARDIAC:  Normal first and second heart sounds; minimal systolic murmur.  ABDOMEN:  Soft and nontender; no masses; normal bowel sounds without  bruits.  EXTREMITIES:  Trace edema; distal pulses intact.   IMPRESSION:  Ms. Wurtz is doing well from a medical standpoint.  Socially, she has some challenges.  She has 2 children, 1 of whom lives  in [place] and the other in the [place].  A brother  lives next to her, but provides her with no assistance.  She does get 9  hours of aide time from [employer].  She receives devices for  the visually impaired; however, she probably would be eligible for some  housing assistance on other social services.  We  will investigate  further resources for her.  She will continue her current medications.  Basic laboratory studies including a CBC, chemistry profile, TSH level,  and lipid profile will be obtained.  Her weight loss is possibly related  to impaired nutrition.  Her aide will investigate meals-on-wheels and  provide her with as much assistance in meeting her nutritional needs as  possible.  I will see this nice woman again in 1 year.     Gerrit Friends. Dietrich Pates, MD, [facility]  Electronically Signed    RMR/MedQ  DD: 05/01/2008  DT: 05/02/2008  Job #: [id number]

## 2010-11-19 NOTE — Procedures (Signed)
Candice Meza, Candice Meza             ACCOUNT NO.:  [id number]   MEDICAL RECORD NO.:  [id number]          PATIENT TYPE:  OUT   LOCATION:  RESP                          FACILITY:  [facility]   PHYSICIAN:  Edward L. Juanetta Gosling, M.D.DATE OF BIRTH:  February 19, 1922   DATE OF PROCEDURE:  DATE OF DISCHARGE:                            PULMONARY FUNCTION TEST   PROCEDURE:  Pulmonary function test.   1. Spirometry shows a normal forced vital capacity and FEV-1 but      perhaps some airflow obstruction which is mild.  2. Lung volumes are normal and, in fact, somewhat higher than      predicted.  3. Diffusion capacity of carbon monoxide was normal.  4. Arterial blood gases normal.      Edward L. Juanetta Gosling, M.D.  Electronically Signed     ELH/MEDQ  D:  02/02/2007  T:  02/02/2007  Job:  [id number]   cc:   Gerrit Friends. Dietrich Pates, MD, [facility]  [address]  [place], [place] [address]

## 2011-03-03 ENCOUNTER — Encounter: Payer: Self-pay | Admitting: Cardiology

## 2011-03-04 ENCOUNTER — Ambulatory Visit: Payer: Medicare Other | Admitting: Cardiology

## 2011-03-08 ENCOUNTER — Ambulatory Visit: Payer: Medicare Other | Admitting: Cardiology

## 2011-03-24 ENCOUNTER — Encounter: Payer: Self-pay | Admitting: Adult Health

## 2011-03-24 ENCOUNTER — Ambulatory Visit (INDEPENDENT_AMBULATORY_CARE_PROVIDER_SITE_OTHER): Payer: Medicare Other | Admitting: Adult Health

## 2011-03-24 DIAGNOSIS — I2510 Atherosclerotic heart disease of native coronary artery without angina pectoris: Secondary | ICD-10-CM

## 2011-03-24 DIAGNOSIS — J40 Bronchitis, not specified as acute or chronic: Secondary | ICD-10-CM

## 2011-03-24 DIAGNOSIS — J449 Chronic obstructive pulmonary disease, unspecified: Secondary | ICD-10-CM | POA: Insufficient documentation

## 2011-03-24 DIAGNOSIS — I10 Essential (primary) hypertension: Secondary | ICD-10-CM

## 2011-03-24 DIAGNOSIS — J4489 Other specified chronic obstructive pulmonary disease: Secondary | ICD-10-CM | POA: Insufficient documentation

## 2011-03-24 NOTE — Progress Notes (Signed)
HPI: [name] is a very pleasant 77 y/o patient of Dr. Dietrich Pates that we are seeing of ongoing assessment and treatment of  CAD with NSTEMI in February of 2012. She had a follow-up stress myoview in Feb of 2012 which resulted as "abnormal revealing a sizeable area of decreased tracer uptake consistent with infarction with mild peri-infarct ischemia.  She had normal EF of 65% and was not consistent with perfusion findings.  She states she did not hear back about stress test after completion and requests info about this now.      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 She lives at the [facility] and is working with Human resources officer for [employer]. She comes today with no significant complaints except that of fatigue chronically. She has frequent congested cough which is worse at night.  She denies chest pain, dizziness or significant shortness of breath.  Allergies  Allergen Reactions  . Morphine And Related   . Penicillins   . Sulfa Antibiotics   . Tetracyclines & Related     Current Outpatient Prescriptions  Medication Sig Dispense Refill  . acetaminophen (TYLENOL) 650 MG CR tablet Take 650 mg by mouth as needed.        . ALPRAZolam (XANAX) 0.25 MG tablet Take 0.25 mg by mouth daily.        [name] aspirin 81 MG tablet Take 81 mg by mouth daily.        . Calcium Carbonate (CALCIUM 600) 1500 MG TABS Take 1 tablet by mouth daily.        . cetirizine (ZYRTEC) 10 MG tablet Take 10 mg by mouth daily.        . citalopram (CELEXA) 20 MG tablet Take 20 mg by mouth daily.        [name] dextromethorphan (DELSYM) 30 MG/5ML liquid Take 60 mg by mouth as needed.        . Dietary Management Product (TOZAL) CAPS Take 3 capsules by mouth daily with breakfast.        . fluticasone (FLONASE) 50 MCG/ACT nasal spray Place 2 sprays into the nose daily.        . furosemide (LASIX) 20 MG tablet Take 20 mg by  mouth daily.        [name] guaiFENesin (MUCINEX) 600 MG 12 hr tablet Take 1,200 mg by mouth as needed.        . Multiple Vitamin (MULTIVITAMIN) tablet Take 1 tablet by mouth daily.        . nabumetone (RELAFEN) 500 MG tablet Take 500 mg by mouth 2 (two) times daily.        . nitroGLYCERIN (NITROSTAT) 0.4 MG SL tablet Place 0.4 mg under the tongue as directed.        . Nutritional Supplements (RESOURCE JUICE DRINK) (FROZEN) LIQD Take 1 Units by mouth daily.        [name] omeprazole (PRILOSEC) 40 MG capsule Take 40 mg by mouth daily.        Bertram Gala Glycol-Propyl Glycol (SYSTANE) 0.4-0.3 % SOLN Apply 1 drop to eye 3 (three) times daily.        . potassium chloride (KLOR-CON) 10 MEQ CR tablet Take 10 mEq by mouth daily.        . pravastatin (PRAVACHOL) 40 MG tablet Take 40 mg by mouth at bedtime.        [name]  promethazine (PHENERGAN) 25 MG tablet Take 25 mg by mouth as needed.        . propranolol (INDERAL) 20 MG tablet Take 20 mg by mouth 4 (four) times daily.        . traMADol (ULTRAM) 50 MG tablet Take 50 mg by mouth as needed.        . vitamin E 400 UNIT capsule Take 400 Units by mouth daily.        . Wheat Dextrin (BENEFIBER) POWD Take by mouth 2 (two) times daily.        [name] zolpidem (AMBIEN) 5 MG tablet Take 5 mg by mouth at bedtime as needed.          Past Medical History  Diagnosis Date  . Hypertension   . Acute non Q wave MI (myocardial infarction), initial episode of care 1997    Questionable lesion in the circumflex at catheterization; normal EF  . Exertional dyspnea     No specific cause identified  . Hyperlipidemia   . Hypertension   . Postmenopausal     Estrogen replacement therapy  . Cough     Prolonged, possible asthmatic bronchitis  . Tremor     Essential  . Osteopenia     Versus osteoporosis  . Macular degeneration   . Degenerative joint disease     versus rheumatoid arthritis  . Falls frequently     Past Surgical History  Procedure Date  . Bladder surgery      Resuspension  . Inguinal hernia repair     Left  . Foot surgery     Bilateral    ZOX:WRUEAV of systems complete and found to be negative unless listed above PHYSICAL EXAM BP 116/64  Pulse 49  Resp 18  Ht 4\' 11"  (1.499 m)  Wt 124 lb 12.8 oz (56.609 kg)  BMI 25.21 kg/m2  SpO2 93% General: Well developed, well nourished, in no acute distress Seated in wheel chair. Head: Eyes PERRLA, No xanthomas.   Normal cephalic and atramatic  Lungs: Bibasilar crackles worse on the right. No wheezes or rhonchi. Heart: HRRR S1 S2, bradycardicwithout MRG.  Pulses are 2+ & equal.            No carotid bruit. No JVD.  No abdominal bruits. No femoral bruits. Abdomen: Bowel sounds are positive, abdomen soft and non-tender without masses or                  Hernia's noted. Msk:  Back normal, normal gait. Normal strength and tone for age. Extremities: No clubbing, cyanosis or edema.  DP +1 Neuro: Alert and oriented X 3. Psych:  Good affect, responds appropriately    ASSESSMENT AND PLAN

## 2011-03-24 NOTE — Assessment & Plan Note (Signed)
She is asymptomatic at present for chest discomfort or pressure. She is bradycardic on this visit at 42 bpm and complains of fatigue. She has had no near syncope or dizziness.  She is on propanolol and this may need to be discontinued if she begins symptoms or HR decreases further.  I will check a CBC, BMET for complaints of fatigue for evaluation of anemia (with diet restrictions) and kidney fx on lasix. She will see Dr. Dietrich Pates in 3 months.

## 2011-03-24 NOTE — Patient Instructions (Signed)
Your physician recommends that you continue on your current medications as directed. Please refer to the Current Medication list given to you today.  Your physician recommends that you schedule a follow-up appointment in: 3 months  

## 2011-03-24 NOTE — Assessment & Plan Note (Signed)
I will increase her Muccinex to BID as opposed to prn as she continues to have congested cough, especially at night when she lies down. Will check a chest x-ray as well along with BNP.  Will follow.

## 2011-04-18 LAB — BLOOD GAS, ARTERIAL
Acid-Base Excess: 1.5
FIO2: 0.21
pCO2 arterial: 40.1
pO2, Arterial: 72.2 — ABNORMAL LOW

## 2011-06-10 ENCOUNTER — Encounter: Payer: Self-pay | Admitting: Cardiology

## 2011-06-24 ENCOUNTER — Encounter: Payer: Self-pay | Admitting: Cardiology

## 2011-06-24 ENCOUNTER — Ambulatory Visit (INDEPENDENT_AMBULATORY_CARE_PROVIDER_SITE_OTHER): Payer: Medicare Other | Admitting: Cardiology

## 2011-06-24 DIAGNOSIS — D649 Anemia, unspecified: Secondary | ICD-10-CM

## 2011-06-24 DIAGNOSIS — H3530 Unspecified macular degeneration: Secondary | ICD-10-CM | POA: Insufficient documentation

## 2011-06-24 DIAGNOSIS — I10 Essential (primary) hypertension: Secondary | ICD-10-CM

## 2011-06-24 DIAGNOSIS — M899 Disorder of bone, unspecified: Secondary | ICD-10-CM

## 2011-06-24 DIAGNOSIS — I214 Non-ST elevation (NSTEMI) myocardial infarction: Secondary | ICD-10-CM

## 2011-06-24 DIAGNOSIS — K839 Disease of biliary tract, unspecified: Secondary | ICD-10-CM

## 2011-06-24 DIAGNOSIS — J4489 Other specified chronic obstructive pulmonary disease: Secondary | ICD-10-CM

## 2011-06-24 DIAGNOSIS — E785 Hyperlipidemia, unspecified: Secondary | ICD-10-CM

## 2011-06-24 DIAGNOSIS — J449 Chronic obstructive pulmonary disease, unspecified: Secondary | ICD-10-CM

## 2011-06-24 DIAGNOSIS — K219 Gastro-esophageal reflux disease without esophagitis: Secondary | ICD-10-CM

## 2011-06-24 DIAGNOSIS — R296 Repeated falls: Secondary | ICD-10-CM | POA: Insufficient documentation

## 2011-06-24 MED ORDER — POTASSIUM CHLORIDE CRYS ER 20 MEQ PO TBCR: 20.0000 meq | EXTENDED_RELEASE_TABLET | Freq: Every day | ORAL | Status: DC

## 2011-06-24 NOTE — Assessment & Plan Note (Addendum)
No lipid profile has been obtained since pravastatin was started last year.  We will remedy that.

## 2011-06-24 NOTE — Progress Notes (Signed)
Patient ID: Candice Meza, female   DOB: 01-Mar-1922, 77 y.o.   MRN: [id number]  HPI: Return visit is scheduled for this delightful octogenarian who I have followed since she suffered an acute myocardial infarction in 1997 without much in the way of atherosclerotic coronary artery disease.  She has done extremely well from a cardiac standpoint, but has chronic problems with asthmatic bronchitis.  She has resided at the [facility] for the past 2 years, not requiring evaluation in the emergency department or hospital admission.  Over the past 2 months, she has noted increased sputum production, cough and difficulty breathing.  She was told that she had pneumonia and has been treated with a number of course of antibiotics.  Repeat chest x-ray is pending.  She has developing mild pedal edema but denies orthopnea, PND, chest discomfort, palpitations, lightheadedness or syncope.  Prior to Admission medications   Medication Sig Start Date End Date Taking? Authorizing Provider  acetaminophen (TYLENOL) 650 MG CR tablet Take 650 mg by mouth as needed.     Yes Historical Provider, MD  albuterol (PROVENTIL HFA;VENTOLIN HFA) 108 (90 BASE) MCG/ACT inhaler Inhale 2 puffs into the lungs every 4 (four) hours as needed.     Yes Historical Provider, MD  ALPRAZolam (XANAX) 0.25 MG tablet Take 0.25 mg by mouth daily.     Yes Historical Provider, MD  aspirin 81 MG tablet Take 81 mg by mouth daily.     Yes Historical Provider, MD  beclomethasone (QVAR) 80 MCG/ACT inhaler Inhale 1 puff into the lungs 2 (two) times daily.     Yes Historical Provider, MD  Calcium Carbonate (CALCIUM 600) 1500 MG TABS Take 1 tablet by mouth daily.     Yes Historical Provider, MD  cetirizine (ZYRTEC) 10 MG tablet Take 10 mg by mouth daily.     Yes Historical Provider, MD  citalopram (CELEXA) 20 MG tablet Take 20 mg by mouth daily.     Yes Historical Provider, MD  dextromethorphan (DELSYM) 30 MG/5ML liquid Take 60 mg by mouth as needed.     Yes  Historical Provider, MD  Dietary Management Product (TOZAL) CAPS Take 3 capsules by mouth daily with breakfast.     Yes Historical Provider, MD  fluticasone (FLONASE) 50 MCG/ACT nasal spray Place 2 sprays into the nose daily.     Yes Historical Provider, MD  furosemide (LASIX) 20 MG tablet Take 20 mg by mouth daily.     Yes Historical Provider, MD  guaiFENesin (MUCINEX) 600 MG 12 hr tablet Take 1,200 mg by mouth as needed.     Yes Historical Provider, MD  Multiple Vitamin (MULTIVITAMIN) tablet Take 1 tablet by mouth daily.     Yes Historical Provider, MD  nabumetone (RELAFEN) 500 MG tablet Take 500 mg by mouth 2 (two) times daily.     Yes Historical Provider, MD  nitroGLYCERIN (NITROSTAT) 0.4 MG SL tablet Place 0.4 mg under the tongue as directed.     Yes Historical Provider, MD  Nutritional Supplements (RESOURCE JUICE DRINK) (FROZEN) LIQD Take 1 Units by mouth daily.     Yes Historical Provider, MD  omeprazole (PRILOSEC) 40 MG capsule Take 40 mg by mouth daily.     Yes Historical Provider, MD  Polyethyl Glycol-Propyl Glycol (SYSTANE) 0.4-0.3 % SOLN Apply 1 drop to eye 3 (three) times daily.     Yes Historical Provider, MD  potassium chloride (KLOR-CON) 10 MEQ CR tablet Take 10 mEq by mouth daily.     Yes  Historical Provider, MD  pravastatin (PRAVACHOL) 40 MG tablet Take 40 mg by mouth at bedtime.     Yes Historical Provider, MD  promethazine (PHENERGAN) 25 MG tablet Take 25 mg by mouth as needed.     Yes Historical Provider, MD  propranolol (INDERAL) 20 MG tablet Take 20 mg by mouth 4 (four) times daily.     Yes Historical Provider, MD  traMADol (ULTRAM) 50 MG tablet Take 50 mg by mouth as needed.     Yes Historical Provider, MD  vitamin E 400 UNIT capsule Take 400 Units by mouth daily.     Yes Historical Provider, MD  Wheat Dextrin (BENEFIBER) POWD Take by mouth 2 (two) times daily.     Yes Historical Provider, MD    Allergies  Allergen Reactions  . Aspirin     GI symptoms  . Morphine And  Related   . Sulfa Antibiotics     dyspnea  . Tetracyclines & Related   . Codeine Rash  . Pamelor Rash  . Penicillins Rash  Past medical history, social history, and family history reviewed and updated.  ROS: See history of present illness.  PHYSICAL EXAM: BP 122/62  Pulse 46  Ht 4' 11.5" (1.511 m)  Wt 55.792 kg (123 lb)  BMI 24.43 kg/m2  General-Well developed; no acute distress Body habitus-proportionate weight and height Neck-No JVD; no carotid bruits Lungs-expiratory rhonchi, particularly at the right base; resonant to percussion; somewhat decreased breath sounds the left base; prolonged expiratory phase Cardiovascular-normal PMI; normal S1 and increased intensity of S2; modest systolic ejection murmur Abdomen-normal bowel sounds; soft and non-tender without masses or organomegaly Musculoskeletal-marked arthritic deformities of fingers on both hands, no cyanosis or clubbing Neurologic-Normal cranial nerves; symmetric strength and tone Skin-Warm, no significant lesions Extremities-distal pulses intact; 1+ ankle and pretibial edema  ASSESSMENT AND PLAN:  [place] Bing, MD 06/24/2011 11:24 AM

## 2011-06-24 NOTE — Assessment & Plan Note (Signed)
Blood pressure control is good; current medication will be continued. 

## 2011-06-24 NOTE — Assessment & Plan Note (Signed)
H&H of 11.9/37.7 in 04/2008 and 10.9/33.4 in 02/2011.  Iron studies will be obtained as well as stool for Hemoccult testing.  If results are not helpful in establishing an etiology, Dr. Neita Carp may wish to perform additional diagnostic testing.

## 2011-06-24 NOTE — Assessment & Plan Note (Signed)
Candice Meza has an area of hypoperfused myocardium in the distribution of her original circumflex event 15 years ago.  In the absence of symptoms and considering the patient's advanced age, no further evaluation or treatment is warranted.

## 2011-06-24 NOTE — Patient Instructions (Signed)
Your physician recommends that you schedule a follow-up appointment in: 7 months  Your physician recommends that you return for lab work in: 3 weeks (FE, TIBC, Lipids, BMET, Ferritin)  Your physician has recommended you make the following change in your medication:  Stop propranolol Increase KCL to 20 meq daily  Stools for hemoccult x 3 and return to the office asap

## 2011-06-24 NOTE — Assessment & Plan Note (Signed)
Excellent medical regime has been prescribed for patient's chronic lung disease.  Once pneumonia has been adequately treated, respiratory status should improve.

## 2011-06-24 NOTE — Assessment & Plan Note (Signed)
Mildly abnormal LFTs with previous evaluation demonstrating minor chronic biliary disease.  Continued observation is appropriate.

## 2011-06-24 NOTE — Assessment & Plan Note (Addendum)
Patient is being treated with a nonsteroidal plus aspirin.  While she derives some protection from treatment with a PPI, she should be followed closely to exclude occult GI blood loss, , especially since she is already anemic.  Stool samples for Hemoccult testing had been requested.

## 2011-07-11 DIAGNOSIS — F411 Generalized anxiety disorder: Secondary | ICD-10-CM | POA: Diagnosis not present

## 2011-07-15 DIAGNOSIS — I4891 Unspecified atrial fibrillation: Secondary | ICD-10-CM | POA: Diagnosis not present

## 2011-07-15 DIAGNOSIS — R5381 Other malaise: Secondary | ICD-10-CM | POA: Diagnosis not present

## 2011-07-15 DIAGNOSIS — I214 Non-ST elevation (NSTEMI) myocardial infarction: Secondary | ICD-10-CM | POA: Diagnosis not present

## 2011-07-15 DIAGNOSIS — I10 Essential (primary) hypertension: Secondary | ICD-10-CM | POA: Diagnosis not present

## 2011-07-17 DIAGNOSIS — J209 Acute bronchitis, unspecified: Secondary | ICD-10-CM | POA: Diagnosis not present

## 2011-07-18 DIAGNOSIS — F411 Generalized anxiety disorder: Secondary | ICD-10-CM | POA: Diagnosis not present

## 2011-07-26 ENCOUNTER — Encounter: Payer: Self-pay | Admitting: Cardiology

## 2011-08-10 DIAGNOSIS — F411 Generalized anxiety disorder: Secondary | ICD-10-CM | POA: Diagnosis not present

## 2011-08-10 DIAGNOSIS — F329 Major depressive disorder, single episode, unspecified: Secondary | ICD-10-CM | POA: Diagnosis not present

## 2011-08-29 DIAGNOSIS — F411 Generalized anxiety disorder: Secondary | ICD-10-CM | POA: Diagnosis not present

## 2011-09-08 DIAGNOSIS — M79609 Pain in unspecified limb: Secondary | ICD-10-CM | POA: Diagnosis not present

## 2011-09-08 DIAGNOSIS — B351 Tinea unguium: Secondary | ICD-10-CM | POA: Diagnosis not present

## 2011-09-09 DIAGNOSIS — I10 Essential (primary) hypertension: Secondary | ICD-10-CM | POA: Diagnosis not present

## 2011-09-09 DIAGNOSIS — I259 Chronic ischemic heart disease, unspecified: Secondary | ICD-10-CM | POA: Diagnosis not present

## 2011-09-09 DIAGNOSIS — E878 Other disorders of electrolyte and fluid balance, not elsewhere classified: Secondary | ICD-10-CM | POA: Diagnosis not present

## 2011-09-09 DIAGNOSIS — N390 Urinary tract infection, site not specified: Secondary | ICD-10-CM | POA: Diagnosis not present

## 2011-09-09 DIAGNOSIS — E46 Unspecified protein-calorie malnutrition: Secondary | ICD-10-CM | POA: Diagnosis not present

## 2011-09-11 DIAGNOSIS — R05 Cough: Secondary | ICD-10-CM | POA: Diagnosis not present

## 2011-09-12 DIAGNOSIS — F411 Generalized anxiety disorder: Secondary | ICD-10-CM | POA: Diagnosis not present

## 2011-10-03 DIAGNOSIS — F411 Generalized anxiety disorder: Secondary | ICD-10-CM | POA: Diagnosis not present

## 2011-10-05 DIAGNOSIS — F329 Major depressive disorder, single episode, unspecified: Secondary | ICD-10-CM | POA: Diagnosis not present

## 2011-10-05 DIAGNOSIS — F411 Generalized anxiety disorder: Secondary | ICD-10-CM | POA: Diagnosis not present

## 2011-10-10 DIAGNOSIS — F411 Generalized anxiety disorder: Secondary | ICD-10-CM | POA: Diagnosis not present

## 2011-10-17 DIAGNOSIS — F411 Generalized anxiety disorder: Secondary | ICD-10-CM | POA: Diagnosis not present

## 2011-10-21 DIAGNOSIS — F411 Generalized anxiety disorder: Secondary | ICD-10-CM | POA: Diagnosis not present

## 2011-10-21 DIAGNOSIS — F329 Major depressive disorder, single episode, unspecified: Secondary | ICD-10-CM | POA: Diagnosis not present

## 2011-10-31 DIAGNOSIS — F411 Generalized anxiety disorder: Secondary | ICD-10-CM | POA: Diagnosis not present

## 2011-11-07 DIAGNOSIS — F411 Generalized anxiety disorder: Secondary | ICD-10-CM | POA: Diagnosis not present

## 2011-11-10 DIAGNOSIS — R05 Cough: Secondary | ICD-10-CM | POA: Diagnosis not present

## 2011-11-14 DIAGNOSIS — F411 Generalized anxiety disorder: Secondary | ICD-10-CM | POA: Diagnosis not present

## 2011-11-23 DIAGNOSIS — F329 Major depressive disorder, single episode, unspecified: Secondary | ICD-10-CM | POA: Diagnosis not present

## 2011-11-23 DIAGNOSIS — F411 Generalized anxiety disorder: Secondary | ICD-10-CM | POA: Diagnosis not present

## 2011-11-28 DIAGNOSIS — M25569 Pain in unspecified knee: Secondary | ICD-10-CM | POA: Diagnosis not present

## 2011-11-28 DIAGNOSIS — M6250 Muscle wasting and atrophy, not elsewhere classified, unspecified site: Secondary | ICD-10-CM | POA: Diagnosis not present

## 2011-11-28 DIAGNOSIS — J189 Pneumonia, unspecified organism: Secondary | ICD-10-CM | POA: Diagnosis not present

## 2011-11-28 DIAGNOSIS — I214 Non-ST elevation (NSTEMI) myocardial infarction: Secondary | ICD-10-CM | POA: Diagnosis not present

## 2011-11-28 DIAGNOSIS — M069 Rheumatoid arthritis, unspecified: Secondary | ICD-10-CM | POA: Diagnosis not present

## 2011-11-28 DIAGNOSIS — R1312 Dysphagia, oropharyngeal phase: Secondary | ICD-10-CM | POA: Diagnosis not present

## 2011-11-28 DIAGNOSIS — K225 Diverticulum of esophagus, acquired: Secondary | ICD-10-CM | POA: Diagnosis not present

## 2011-11-28 DIAGNOSIS — R279 Unspecified lack of coordination: Secondary | ICD-10-CM | POA: Diagnosis not present

## 2011-11-30 DIAGNOSIS — J189 Pneumonia, unspecified organism: Secondary | ICD-10-CM | POA: Diagnosis not present

## 2011-11-30 DIAGNOSIS — I214 Non-ST elevation (NSTEMI) myocardial infarction: Secondary | ICD-10-CM | POA: Diagnosis not present

## 2011-11-30 DIAGNOSIS — K225 Diverticulum of esophagus, acquired: Secondary | ICD-10-CM | POA: Diagnosis not present

## 2011-11-30 DIAGNOSIS — M069 Rheumatoid arthritis, unspecified: Secondary | ICD-10-CM | POA: Diagnosis not present

## 2011-11-30 DIAGNOSIS — R279 Unspecified lack of coordination: Secondary | ICD-10-CM | POA: Diagnosis not present

## 2011-11-30 DIAGNOSIS — R1312 Dysphagia, oropharyngeal phase: Secondary | ICD-10-CM | POA: Diagnosis not present

## 2011-12-01 DIAGNOSIS — M79609 Pain in unspecified limb: Secondary | ICD-10-CM | POA: Diagnosis not present

## 2011-12-01 DIAGNOSIS — B351 Tinea unguium: Secondary | ICD-10-CM | POA: Diagnosis not present

## 2011-12-02 DIAGNOSIS — I214 Non-ST elevation (NSTEMI) myocardial infarction: Secondary | ICD-10-CM | POA: Diagnosis not present

## 2011-12-02 DIAGNOSIS — K225 Diverticulum of esophagus, acquired: Secondary | ICD-10-CM | POA: Diagnosis not present

## 2011-12-02 DIAGNOSIS — M069 Rheumatoid arthritis, unspecified: Secondary | ICD-10-CM | POA: Diagnosis not present

## 2011-12-02 DIAGNOSIS — R279 Unspecified lack of coordination: Secondary | ICD-10-CM | POA: Diagnosis not present

## 2011-12-02 DIAGNOSIS — R1312 Dysphagia, oropharyngeal phase: Secondary | ICD-10-CM | POA: Diagnosis not present

## 2011-12-02 DIAGNOSIS — J189 Pneumonia, unspecified organism: Secondary | ICD-10-CM | POA: Diagnosis not present

## 2011-12-05 DIAGNOSIS — I214 Non-ST elevation (NSTEMI) myocardial infarction: Secondary | ICD-10-CM | POA: Diagnosis not present

## 2011-12-05 DIAGNOSIS — F411 Generalized anxiety disorder: Secondary | ICD-10-CM | POA: Diagnosis not present

## 2011-12-05 DIAGNOSIS — M25649 Stiffness of unspecified hand, not elsewhere classified: Secondary | ICD-10-CM | POA: Diagnosis not present

## 2011-12-05 DIAGNOSIS — I259 Chronic ischemic heart disease, unspecified: Secondary | ICD-10-CM | POA: Diagnosis not present

## 2011-12-05 DIAGNOSIS — I4891 Unspecified atrial fibrillation: Secondary | ICD-10-CM | POA: Diagnosis not present

## 2011-12-05 DIAGNOSIS — K225 Diverticulum of esophagus, acquired: Secondary | ICD-10-CM | POA: Diagnosis not present

## 2011-12-05 DIAGNOSIS — M6250 Muscle wasting and atrophy, not elsewhere classified, unspecified site: Secondary | ICD-10-CM | POA: Diagnosis not present

## 2011-12-05 DIAGNOSIS — M1710 Unilateral primary osteoarthritis, unspecified knee: Secondary | ICD-10-CM | POA: Diagnosis not present

## 2011-12-05 DIAGNOSIS — M069 Rheumatoid arthritis, unspecified: Secondary | ICD-10-CM | POA: Diagnosis not present

## 2011-12-05 DIAGNOSIS — R1312 Dysphagia, oropharyngeal phase: Secondary | ICD-10-CM | POA: Diagnosis not present

## 2011-12-05 DIAGNOSIS — J189 Pneumonia, unspecified organism: Secondary | ICD-10-CM | POA: Diagnosis not present

## 2011-12-05 DIAGNOSIS — R279 Unspecified lack of coordination: Secondary | ICD-10-CM | POA: Diagnosis not present

## 2011-12-05 DIAGNOSIS — R269 Unspecified abnormalities of gait and mobility: Secondary | ICD-10-CM | POA: Diagnosis not present

## 2011-12-06 DIAGNOSIS — I259 Chronic ischemic heart disease, unspecified: Secondary | ICD-10-CM | POA: Diagnosis not present

## 2011-12-06 DIAGNOSIS — J189 Pneumonia, unspecified organism: Secondary | ICD-10-CM | POA: Diagnosis not present

## 2011-12-06 DIAGNOSIS — R0989 Other specified symptoms and signs involving the circulatory and respiratory systems: Secondary | ICD-10-CM | POA: Diagnosis not present

## 2011-12-06 DIAGNOSIS — M069 Rheumatoid arthritis, unspecified: Secondary | ICD-10-CM | POA: Diagnosis not present

## 2011-12-06 DIAGNOSIS — R279 Unspecified lack of coordination: Secondary | ICD-10-CM | POA: Diagnosis not present

## 2011-12-06 DIAGNOSIS — R1312 Dysphagia, oropharyngeal phase: Secondary | ICD-10-CM | POA: Diagnosis not present

## 2011-12-06 DIAGNOSIS — K225 Diverticulum of esophagus, acquired: Secondary | ICD-10-CM | POA: Diagnosis not present

## 2011-12-07 DIAGNOSIS — H04129 Dry eye syndrome of unspecified lacrimal gland: Secondary | ICD-10-CM | POA: Diagnosis not present

## 2011-12-07 DIAGNOSIS — H353190 Nonexudative age-related macular degeneration, unspecified eye, stage unspecified: Secondary | ICD-10-CM | POA: Diagnosis not present

## 2011-12-09 DIAGNOSIS — M069 Rheumatoid arthritis, unspecified: Secondary | ICD-10-CM | POA: Diagnosis not present

## 2011-12-09 DIAGNOSIS — J189 Pneumonia, unspecified organism: Secondary | ICD-10-CM | POA: Diagnosis not present

## 2011-12-09 DIAGNOSIS — K225 Diverticulum of esophagus, acquired: Secondary | ICD-10-CM | POA: Diagnosis not present

## 2011-12-09 DIAGNOSIS — R279 Unspecified lack of coordination: Secondary | ICD-10-CM | POA: Diagnosis not present

## 2011-12-09 DIAGNOSIS — R1312 Dysphagia, oropharyngeal phase: Secondary | ICD-10-CM | POA: Diagnosis not present

## 2011-12-09 DIAGNOSIS — I259 Chronic ischemic heart disease, unspecified: Secondary | ICD-10-CM | POA: Diagnosis not present

## 2011-12-12 DIAGNOSIS — R279 Unspecified lack of coordination: Secondary | ICD-10-CM | POA: Diagnosis not present

## 2011-12-12 DIAGNOSIS — I259 Chronic ischemic heart disease, unspecified: Secondary | ICD-10-CM | POA: Diagnosis not present

## 2011-12-12 DIAGNOSIS — J189 Pneumonia, unspecified organism: Secondary | ICD-10-CM | POA: Diagnosis not present

## 2011-12-12 DIAGNOSIS — K225 Diverticulum of esophagus, acquired: Secondary | ICD-10-CM | POA: Diagnosis not present

## 2011-12-12 DIAGNOSIS — R1312 Dysphagia, oropharyngeal phase: Secondary | ICD-10-CM | POA: Diagnosis not present

## 2011-12-12 DIAGNOSIS — M069 Rheumatoid arthritis, unspecified: Secondary | ICD-10-CM | POA: Diagnosis not present

## 2011-12-13 DIAGNOSIS — M069 Rheumatoid arthritis, unspecified: Secondary | ICD-10-CM | POA: Diagnosis not present

## 2011-12-13 DIAGNOSIS — I259 Chronic ischemic heart disease, unspecified: Secondary | ICD-10-CM | POA: Diagnosis not present

## 2011-12-13 DIAGNOSIS — R279 Unspecified lack of coordination: Secondary | ICD-10-CM | POA: Diagnosis not present

## 2011-12-13 DIAGNOSIS — R1312 Dysphagia, oropharyngeal phase: Secondary | ICD-10-CM | POA: Diagnosis not present

## 2011-12-13 DIAGNOSIS — K225 Diverticulum of esophagus, acquired: Secondary | ICD-10-CM | POA: Diagnosis not present

## 2011-12-13 DIAGNOSIS — J189 Pneumonia, unspecified organism: Secondary | ICD-10-CM | POA: Diagnosis not present

## 2011-12-16 DIAGNOSIS — I259 Chronic ischemic heart disease, unspecified: Secondary | ICD-10-CM | POA: Diagnosis not present

## 2011-12-16 DIAGNOSIS — M069 Rheumatoid arthritis, unspecified: Secondary | ICD-10-CM | POA: Diagnosis not present

## 2011-12-16 DIAGNOSIS — R1312 Dysphagia, oropharyngeal phase: Secondary | ICD-10-CM | POA: Diagnosis not present

## 2011-12-16 DIAGNOSIS — J189 Pneumonia, unspecified organism: Secondary | ICD-10-CM | POA: Diagnosis not present

## 2011-12-16 DIAGNOSIS — R279 Unspecified lack of coordination: Secondary | ICD-10-CM | POA: Diagnosis not present

## 2011-12-16 DIAGNOSIS — K225 Diverticulum of esophagus, acquired: Secondary | ICD-10-CM | POA: Diagnosis not present

## 2011-12-19 DIAGNOSIS — K225 Diverticulum of esophagus, acquired: Secondary | ICD-10-CM | POA: Diagnosis not present

## 2011-12-19 DIAGNOSIS — R1312 Dysphagia, oropharyngeal phase: Secondary | ICD-10-CM | POA: Diagnosis not present

## 2011-12-19 DIAGNOSIS — R279 Unspecified lack of coordination: Secondary | ICD-10-CM | POA: Diagnosis not present

## 2011-12-19 DIAGNOSIS — J189 Pneumonia, unspecified organism: Secondary | ICD-10-CM | POA: Diagnosis not present

## 2011-12-19 DIAGNOSIS — I259 Chronic ischemic heart disease, unspecified: Secondary | ICD-10-CM | POA: Diagnosis not present

## 2011-12-19 DIAGNOSIS — M069 Rheumatoid arthritis, unspecified: Secondary | ICD-10-CM | POA: Diagnosis not present

## 2011-12-20 DIAGNOSIS — R1312 Dysphagia, oropharyngeal phase: Secondary | ICD-10-CM | POA: Diagnosis not present

## 2011-12-20 DIAGNOSIS — R279 Unspecified lack of coordination: Secondary | ICD-10-CM | POA: Diagnosis not present

## 2011-12-20 DIAGNOSIS — I259 Chronic ischemic heart disease, unspecified: Secondary | ICD-10-CM | POA: Diagnosis not present

## 2011-12-20 DIAGNOSIS — M069 Rheumatoid arthritis, unspecified: Secondary | ICD-10-CM | POA: Diagnosis not present

## 2011-12-20 DIAGNOSIS — J189 Pneumonia, unspecified organism: Secondary | ICD-10-CM | POA: Diagnosis not present

## 2011-12-20 DIAGNOSIS — K225 Diverticulum of esophagus, acquired: Secondary | ICD-10-CM | POA: Diagnosis not present

## 2011-12-22 DIAGNOSIS — R1312 Dysphagia, oropharyngeal phase: Secondary | ICD-10-CM | POA: Diagnosis not present

## 2011-12-22 DIAGNOSIS — K225 Diverticulum of esophagus, acquired: Secondary | ICD-10-CM | POA: Diagnosis not present

## 2011-12-22 DIAGNOSIS — J189 Pneumonia, unspecified organism: Secondary | ICD-10-CM | POA: Diagnosis not present

## 2011-12-22 DIAGNOSIS — I259 Chronic ischemic heart disease, unspecified: Secondary | ICD-10-CM | POA: Diagnosis not present

## 2011-12-22 DIAGNOSIS — R279 Unspecified lack of coordination: Secondary | ICD-10-CM | POA: Diagnosis not present

## 2011-12-22 DIAGNOSIS — M069 Rheumatoid arthritis, unspecified: Secondary | ICD-10-CM | POA: Diagnosis not present

## 2011-12-26 DIAGNOSIS — J189 Pneumonia, unspecified organism: Secondary | ICD-10-CM | POA: Diagnosis not present

## 2011-12-26 DIAGNOSIS — I259 Chronic ischemic heart disease, unspecified: Secondary | ICD-10-CM | POA: Diagnosis not present

## 2011-12-26 DIAGNOSIS — R1312 Dysphagia, oropharyngeal phase: Secondary | ICD-10-CM | POA: Diagnosis not present

## 2011-12-26 DIAGNOSIS — K225 Diverticulum of esophagus, acquired: Secondary | ICD-10-CM | POA: Diagnosis not present

## 2011-12-26 DIAGNOSIS — R279 Unspecified lack of coordination: Secondary | ICD-10-CM | POA: Diagnosis not present

## 2011-12-26 DIAGNOSIS — M069 Rheumatoid arthritis, unspecified: Secondary | ICD-10-CM | POA: Diagnosis not present

## 2011-12-27 DIAGNOSIS — R1312 Dysphagia, oropharyngeal phase: Secondary | ICD-10-CM | POA: Diagnosis not present

## 2011-12-27 DIAGNOSIS — K225 Diverticulum of esophagus, acquired: Secondary | ICD-10-CM | POA: Diagnosis not present

## 2011-12-27 DIAGNOSIS — I259 Chronic ischemic heart disease, unspecified: Secondary | ICD-10-CM | POA: Diagnosis not present

## 2011-12-27 DIAGNOSIS — J189 Pneumonia, unspecified organism: Secondary | ICD-10-CM | POA: Diagnosis not present

## 2011-12-27 DIAGNOSIS — R279 Unspecified lack of coordination: Secondary | ICD-10-CM | POA: Diagnosis not present

## 2011-12-27 DIAGNOSIS — M069 Rheumatoid arthritis, unspecified: Secondary | ICD-10-CM | POA: Diagnosis not present

## 2011-12-28 DIAGNOSIS — I259 Chronic ischemic heart disease, unspecified: Secondary | ICD-10-CM | POA: Diagnosis not present

## 2011-12-28 DIAGNOSIS — R279 Unspecified lack of coordination: Secondary | ICD-10-CM | POA: Diagnosis not present

## 2011-12-28 DIAGNOSIS — M069 Rheumatoid arthritis, unspecified: Secondary | ICD-10-CM | POA: Diagnosis not present

## 2011-12-28 DIAGNOSIS — R1312 Dysphagia, oropharyngeal phase: Secondary | ICD-10-CM | POA: Diagnosis not present

## 2011-12-28 DIAGNOSIS — J189 Pneumonia, unspecified organism: Secondary | ICD-10-CM | POA: Diagnosis not present

## 2011-12-28 DIAGNOSIS — K225 Diverticulum of esophagus, acquired: Secondary | ICD-10-CM | POA: Diagnosis not present

## 2011-12-29 DIAGNOSIS — R1312 Dysphagia, oropharyngeal phase: Secondary | ICD-10-CM | POA: Diagnosis not present

## 2011-12-29 DIAGNOSIS — R279 Unspecified lack of coordination: Secondary | ICD-10-CM | POA: Diagnosis not present

## 2011-12-29 DIAGNOSIS — K225 Diverticulum of esophagus, acquired: Secondary | ICD-10-CM | POA: Diagnosis not present

## 2011-12-29 DIAGNOSIS — I259 Chronic ischemic heart disease, unspecified: Secondary | ICD-10-CM | POA: Diagnosis not present

## 2011-12-29 DIAGNOSIS — M069 Rheumatoid arthritis, unspecified: Secondary | ICD-10-CM | POA: Diagnosis not present

## 2011-12-29 DIAGNOSIS — J189 Pneumonia, unspecified organism: Secondary | ICD-10-CM | POA: Diagnosis not present

## 2012-01-02 DIAGNOSIS — R1312 Dysphagia, oropharyngeal phase: Secondary | ICD-10-CM | POA: Diagnosis not present

## 2012-01-02 DIAGNOSIS — M25569 Pain in unspecified knee: Secondary | ICD-10-CM | POA: Diagnosis not present

## 2012-01-02 DIAGNOSIS — I4891 Unspecified atrial fibrillation: Secondary | ICD-10-CM | POA: Diagnosis not present

## 2012-01-02 DIAGNOSIS — M6250 Muscle wasting and atrophy, not elsewhere classified, unspecified site: Secondary | ICD-10-CM | POA: Diagnosis not present

## 2012-01-02 DIAGNOSIS — R269 Unspecified abnormalities of gait and mobility: Secondary | ICD-10-CM | POA: Diagnosis not present

## 2012-01-02 DIAGNOSIS — I259 Chronic ischemic heart disease, unspecified: Secondary | ICD-10-CM | POA: Diagnosis not present

## 2012-01-02 DIAGNOSIS — M1710 Unilateral primary osteoarthritis, unspecified knee: Secondary | ICD-10-CM | POA: Diagnosis not present

## 2012-01-02 DIAGNOSIS — R279 Unspecified lack of coordination: Secondary | ICD-10-CM | POA: Diagnosis not present

## 2012-01-02 DIAGNOSIS — K225 Diverticulum of esophagus, acquired: Secondary | ICD-10-CM | POA: Diagnosis not present

## 2012-01-02 DIAGNOSIS — M069 Rheumatoid arthritis, unspecified: Secondary | ICD-10-CM | POA: Diagnosis not present

## 2012-01-02 DIAGNOSIS — J189 Pneumonia, unspecified organism: Secondary | ICD-10-CM | POA: Diagnosis not present

## 2012-01-02 DIAGNOSIS — I214 Non-ST elevation (NSTEMI) myocardial infarction: Secondary | ICD-10-CM | POA: Diagnosis not present

## 2012-01-02 DIAGNOSIS — M25649 Stiffness of unspecified hand, not elsewhere classified: Secondary | ICD-10-CM | POA: Diagnosis not present

## 2012-01-03 DIAGNOSIS — R269 Unspecified abnormalities of gait and mobility: Secondary | ICD-10-CM | POA: Diagnosis not present

## 2012-01-03 DIAGNOSIS — J189 Pneumonia, unspecified organism: Secondary | ICD-10-CM | POA: Diagnosis not present

## 2012-01-03 DIAGNOSIS — M069 Rheumatoid arthritis, unspecified: Secondary | ICD-10-CM | POA: Diagnosis not present

## 2012-01-03 DIAGNOSIS — R279 Unspecified lack of coordination: Secondary | ICD-10-CM | POA: Diagnosis not present

## 2012-01-03 DIAGNOSIS — R1312 Dysphagia, oropharyngeal phase: Secondary | ICD-10-CM | POA: Diagnosis not present

## 2012-01-03 DIAGNOSIS — K225 Diverticulum of esophagus, acquired: Secondary | ICD-10-CM | POA: Diagnosis not present

## 2012-01-04 DIAGNOSIS — J189 Pneumonia, unspecified organism: Secondary | ICD-10-CM | POA: Diagnosis not present

## 2012-01-04 DIAGNOSIS — R1312 Dysphagia, oropharyngeal phase: Secondary | ICD-10-CM | POA: Diagnosis not present

## 2012-01-04 DIAGNOSIS — R279 Unspecified lack of coordination: Secondary | ICD-10-CM | POA: Diagnosis not present

## 2012-01-04 DIAGNOSIS — M069 Rheumatoid arthritis, unspecified: Secondary | ICD-10-CM | POA: Diagnosis not present

## 2012-01-04 DIAGNOSIS — K225 Diverticulum of esophagus, acquired: Secondary | ICD-10-CM | POA: Diagnosis not present

## 2012-01-04 DIAGNOSIS — R269 Unspecified abnormalities of gait and mobility: Secondary | ICD-10-CM | POA: Diagnosis not present

## 2012-01-06 DIAGNOSIS — R1312 Dysphagia, oropharyngeal phase: Secondary | ICD-10-CM | POA: Diagnosis not present

## 2012-01-06 DIAGNOSIS — K225 Diverticulum of esophagus, acquired: Secondary | ICD-10-CM | POA: Diagnosis not present

## 2012-01-06 DIAGNOSIS — R279 Unspecified lack of coordination: Secondary | ICD-10-CM | POA: Diagnosis not present

## 2012-01-06 DIAGNOSIS — M069 Rheumatoid arthritis, unspecified: Secondary | ICD-10-CM | POA: Diagnosis not present

## 2012-01-06 DIAGNOSIS — R269 Unspecified abnormalities of gait and mobility: Secondary | ICD-10-CM | POA: Diagnosis not present

## 2012-01-06 DIAGNOSIS — J189 Pneumonia, unspecified organism: Secondary | ICD-10-CM | POA: Diagnosis not present

## 2012-01-09 DIAGNOSIS — K225 Diverticulum of esophagus, acquired: Secondary | ICD-10-CM | POA: Diagnosis not present

## 2012-01-09 DIAGNOSIS — J189 Pneumonia, unspecified organism: Secondary | ICD-10-CM | POA: Diagnosis not present

## 2012-01-09 DIAGNOSIS — R269 Unspecified abnormalities of gait and mobility: Secondary | ICD-10-CM | POA: Diagnosis not present

## 2012-01-09 DIAGNOSIS — M069 Rheumatoid arthritis, unspecified: Secondary | ICD-10-CM | POA: Diagnosis not present

## 2012-01-09 DIAGNOSIS — R1312 Dysphagia, oropharyngeal phase: Secondary | ICD-10-CM | POA: Diagnosis not present

## 2012-01-09 DIAGNOSIS — R279 Unspecified lack of coordination: Secondary | ICD-10-CM | POA: Diagnosis not present

## 2012-01-10 DIAGNOSIS — M069 Rheumatoid arthritis, unspecified: Secondary | ICD-10-CM | POA: Diagnosis not present

## 2012-01-10 DIAGNOSIS — R279 Unspecified lack of coordination: Secondary | ICD-10-CM | POA: Diagnosis not present

## 2012-01-10 DIAGNOSIS — J189 Pneumonia, unspecified organism: Secondary | ICD-10-CM | POA: Diagnosis not present

## 2012-01-10 DIAGNOSIS — K225 Diverticulum of esophagus, acquired: Secondary | ICD-10-CM | POA: Diagnosis not present

## 2012-01-10 DIAGNOSIS — R269 Unspecified abnormalities of gait and mobility: Secondary | ICD-10-CM | POA: Diagnosis not present

## 2012-01-10 DIAGNOSIS — R1312 Dysphagia, oropharyngeal phase: Secondary | ICD-10-CM | POA: Diagnosis not present

## 2012-01-11 DIAGNOSIS — R1312 Dysphagia, oropharyngeal phase: Secondary | ICD-10-CM | POA: Diagnosis not present

## 2012-01-11 DIAGNOSIS — R279 Unspecified lack of coordination: Secondary | ICD-10-CM | POA: Diagnosis not present

## 2012-01-11 DIAGNOSIS — M069 Rheumatoid arthritis, unspecified: Secondary | ICD-10-CM | POA: Diagnosis not present

## 2012-01-11 DIAGNOSIS — K225 Diverticulum of esophagus, acquired: Secondary | ICD-10-CM | POA: Diagnosis not present

## 2012-01-11 DIAGNOSIS — R269 Unspecified abnormalities of gait and mobility: Secondary | ICD-10-CM | POA: Diagnosis not present

## 2012-01-11 DIAGNOSIS — J189 Pneumonia, unspecified organism: Secondary | ICD-10-CM | POA: Diagnosis not present

## 2012-01-12 DIAGNOSIS — R1312 Dysphagia, oropharyngeal phase: Secondary | ICD-10-CM | POA: Diagnosis not present

## 2012-01-12 DIAGNOSIS — M069 Rheumatoid arthritis, unspecified: Secondary | ICD-10-CM | POA: Diagnosis not present

## 2012-01-12 DIAGNOSIS — R269 Unspecified abnormalities of gait and mobility: Secondary | ICD-10-CM | POA: Diagnosis not present

## 2012-01-12 DIAGNOSIS — R279 Unspecified lack of coordination: Secondary | ICD-10-CM | POA: Diagnosis not present

## 2012-01-12 DIAGNOSIS — K225 Diverticulum of esophagus, acquired: Secondary | ICD-10-CM | POA: Diagnosis not present

## 2012-01-12 DIAGNOSIS — J189 Pneumonia, unspecified organism: Secondary | ICD-10-CM | POA: Diagnosis not present

## 2012-01-13 DIAGNOSIS — R279 Unspecified lack of coordination: Secondary | ICD-10-CM | POA: Diagnosis not present

## 2012-01-13 DIAGNOSIS — M069 Rheumatoid arthritis, unspecified: Secondary | ICD-10-CM | POA: Diagnosis not present

## 2012-01-13 DIAGNOSIS — K225 Diverticulum of esophagus, acquired: Secondary | ICD-10-CM | POA: Diagnosis not present

## 2012-01-13 DIAGNOSIS — R269 Unspecified abnormalities of gait and mobility: Secondary | ICD-10-CM | POA: Diagnosis not present

## 2012-01-13 DIAGNOSIS — J189 Pneumonia, unspecified organism: Secondary | ICD-10-CM | POA: Diagnosis not present

## 2012-01-13 DIAGNOSIS — R1312 Dysphagia, oropharyngeal phase: Secondary | ICD-10-CM | POA: Diagnosis not present

## 2012-01-16 DIAGNOSIS — K225 Diverticulum of esophagus, acquired: Secondary | ICD-10-CM | POA: Diagnosis not present

## 2012-01-16 DIAGNOSIS — R279 Unspecified lack of coordination: Secondary | ICD-10-CM | POA: Diagnosis not present

## 2012-01-16 DIAGNOSIS — J189 Pneumonia, unspecified organism: Secondary | ICD-10-CM | POA: Diagnosis not present

## 2012-01-16 DIAGNOSIS — R269 Unspecified abnormalities of gait and mobility: Secondary | ICD-10-CM | POA: Diagnosis not present

## 2012-01-16 DIAGNOSIS — M069 Rheumatoid arthritis, unspecified: Secondary | ICD-10-CM | POA: Diagnosis not present

## 2012-01-16 DIAGNOSIS — R1312 Dysphagia, oropharyngeal phase: Secondary | ICD-10-CM | POA: Diagnosis not present

## 2012-01-17 DIAGNOSIS — R1312 Dysphagia, oropharyngeal phase: Secondary | ICD-10-CM | POA: Diagnosis not present

## 2012-01-17 DIAGNOSIS — R279 Unspecified lack of coordination: Secondary | ICD-10-CM | POA: Diagnosis not present

## 2012-01-17 DIAGNOSIS — J189 Pneumonia, unspecified organism: Secondary | ICD-10-CM | POA: Diagnosis not present

## 2012-01-17 DIAGNOSIS — R269 Unspecified abnormalities of gait and mobility: Secondary | ICD-10-CM | POA: Diagnosis not present

## 2012-01-17 DIAGNOSIS — M069 Rheumatoid arthritis, unspecified: Secondary | ICD-10-CM | POA: Diagnosis not present

## 2012-01-17 DIAGNOSIS — K225 Diverticulum of esophagus, acquired: Secondary | ICD-10-CM | POA: Diagnosis not present

## 2012-01-19 DIAGNOSIS — R1312 Dysphagia, oropharyngeal phase: Secondary | ICD-10-CM | POA: Diagnosis not present

## 2012-01-19 DIAGNOSIS — K225 Diverticulum of esophagus, acquired: Secondary | ICD-10-CM | POA: Diagnosis not present

## 2012-01-19 DIAGNOSIS — M069 Rheumatoid arthritis, unspecified: Secondary | ICD-10-CM | POA: Diagnosis not present

## 2012-01-19 DIAGNOSIS — R279 Unspecified lack of coordination: Secondary | ICD-10-CM | POA: Diagnosis not present

## 2012-01-19 DIAGNOSIS — J189 Pneumonia, unspecified organism: Secondary | ICD-10-CM | POA: Diagnosis not present

## 2012-01-19 DIAGNOSIS — R269 Unspecified abnormalities of gait and mobility: Secondary | ICD-10-CM | POA: Diagnosis not present

## 2012-01-20 DIAGNOSIS — R279 Unspecified lack of coordination: Secondary | ICD-10-CM | POA: Diagnosis not present

## 2012-01-20 DIAGNOSIS — K225 Diverticulum of esophagus, acquired: Secondary | ICD-10-CM | POA: Diagnosis not present

## 2012-01-20 DIAGNOSIS — J189 Pneumonia, unspecified organism: Secondary | ICD-10-CM | POA: Diagnosis not present

## 2012-01-20 DIAGNOSIS — R1312 Dysphagia, oropharyngeal phase: Secondary | ICD-10-CM | POA: Diagnosis not present

## 2012-01-20 DIAGNOSIS — M069 Rheumatoid arthritis, unspecified: Secondary | ICD-10-CM | POA: Diagnosis not present

## 2012-01-20 DIAGNOSIS — R269 Unspecified abnormalities of gait and mobility: Secondary | ICD-10-CM | POA: Diagnosis not present

## 2012-01-24 ENCOUNTER — Ambulatory Visit (INDEPENDENT_AMBULATORY_CARE_PROVIDER_SITE_OTHER): Payer: Medicare Other | Admitting: Cardiology

## 2012-01-24 ENCOUNTER — Encounter: Payer: Self-pay | Admitting: Cardiology

## 2012-01-24 VITALS — BP 120/70 | HR 64 | Ht 59.0 in | Wt 121.4 lb

## 2012-01-24 DIAGNOSIS — D649 Anemia, unspecified: Secondary | ICD-10-CM | POA: Diagnosis not present

## 2012-01-24 DIAGNOSIS — I10 Essential (primary) hypertension: Secondary | ICD-10-CM

## 2012-01-24 DIAGNOSIS — R29818 Other symptoms and signs involving the nervous system: Secondary | ICD-10-CM | POA: Diagnosis not present

## 2012-01-24 DIAGNOSIS — R296 Repeated falls: Secondary | ICD-10-CM

## 2012-01-24 DIAGNOSIS — E785 Hyperlipidemia, unspecified: Secondary | ICD-10-CM

## 2012-01-24 NOTE — Progress Notes (Deleted)
Name: Candice Meza    DOB: 1921-09-13  Age: 78 y.o.  MR#: [id number]       PCP:  Estanislado Pandy, MD      Insurance: @PAYORNAME @   CC:   No chief complaint on file.   VS BP 120/70  Pulse 64  Ht 4\' 11"  (1.499 m)  Wt 121 lb 6.4 oz (55.067 kg)  BMI 24.52 kg/m2  Weights Current Weight  01/24/12 121 lb 6.4 oz (55.067 kg)  06/24/11 123 lb (55.792 kg)  03/24/11 124 lb 12.8 oz (56.609 kg)    Blood Pressure  BP Readings from Last 3 Encounters:  01/24/12 120/70  06/24/11 122/62  03/24/11 116/64     Admit date:  (Not on file) Last encounter with RMR:  Visit date not found   Allergy Allergies  Allergen Reactions  . Aspirin     GI symptoms  . Bee Venom   . Morphine And Related   . Sulfa Antibiotics     dyspnea  . Tetracyclines & Related   . Codeine Rash  . Pamelor Rash  . Penicillins Rash    Current Outpatient Prescriptions  Medication Sig Dispense Refill  . acetaminophen (TYLENOL) 650 MG CR tablet Take 650 mg by mouth as needed.        [name] albuterol (PROVENTIL HFA;VENTOLIN HFA) 108 (90 BASE) MCG/ACT inhaler Inhale 2 puffs into the lungs every 4 (four) hours as needed.        [name] albuterol (PROVENTIL) (2.5 MG/3ML) 0.083% nebulizer solution Take 2.5 mg by nebulization every 4 (four) hours as needed.      . ALPRAZolam (XANAX) 0.25 MG tablet Take 0.25 mg by mouth daily.        [name] aspirin 81 MG tablet Take 81 mg by mouth daily.        . beclomethasone (QVAR) 80 MCG/ACT inhaler Inhale 1 puff into the lungs 2 (two) times daily.        . Calcium Carbonate (CALCIUM 600) 1500 MG TABS Take 1 tablet by mouth daily.        . cetirizine (ZYRTEC) 10 MG tablet Take 10 mg by mouth daily.        . citalopram (CELEXA) 20 MG tablet Take 20 mg by mouth daily.        [name] dextromethorphan (DELSYM) 30 MG/5ML liquid Take 60 mg by mouth as needed.        . fluticasone (FLONASE) 50 MCG/ACT nasal spray Place 2 sprays into the nose daily.        . furosemide (LASIX) 20 MG tablet Take 20 mg by mouth daily.         . furosemide (LASIX) 40 MG tablet Take 40 mg by mouth as needed.      [name] guaiFENesin (MUCINEX) 600 MG 12 hr tablet Take 1,200 mg by mouth as needed.        . Multiple Vitamin (MULTIVITAMIN) tablet Take 1 tablet by mouth daily.        . nitroGLYCERIN (NITROSTAT) 0.4 MG SL tablet Place 0.4 mg under the tongue as directed.        [name] omeprazole (PRILOSEC) 40 MG capsule Take 40 mg by mouth daily.        Bertram Gala Glycol-Propyl Glycol (SYSTANE) 0.4-0.3 % SOLN Apply 1 drop to eye 3 (three) times daily.        . potassium chloride SA (K-DUR,KLOR-CON) 20 MEQ tablet Take 1 tablet (20 mEq total) by mouth daily.  90 tablet  3  . pravastatin (PRAVACHOL) 40 MG tablet Take 40 mg by mouth at bedtime.        . promethazine (PHENERGAN) 25 MG tablet Take 25 mg by mouth as needed.        . traMADol (ULTRAM) 50 MG tablet Take 50 mg by mouth as needed.        . vitamin E 400 UNIT capsule Take 400 Units by mouth daily.        . Wheat Dextrin (BENEFIBER) POWD Take by mouth 2 (two) times daily.          Discontinued Meds:    Medications Discontinued During This Encounter  Medication Reason  . Dietary Management Product (TOZAL) CAPS Error  . nabumetone (RELAFEN) 500 MG tablet Error  . Nutritional Supplements (RESOURCE JUICE DRINK) (FROZEN) LIQD Error    Patient Active Problem List  Diagnosis  . HYPERLIPIDEMIA  . HYPERTENSION  . Gastroesophageal reflux disease  . BILIARY TRACT DISORDER  . DEGENERATIVE JOINT DISEASE  . OSTEOPENIA  . Asthmatic bronchitis , chronic  . Repeated falls  . MI, acute, non ST segment elevation  . Macular degeneration  . Anemia    LABS No visits with results within 3 Month(s) from this visit. Latest known visit with results is:  [facility] Conversion Encounter on 08/12/2009  Component Date Value  . Sodium 08/12/2009 136   . Potassium 08/12/2009 3.4   . Chloride 08/12/2009 99   . CO2 08/12/2009 27.0   . Glucose, Bld 08/12/2009 96   . BUN 08/12/2009 4   . Creatinine, Ser  08/12/2009 0.20   . GFR calc non Af Amer 08/12/2009 >60   . Glomerular Filtration Ra* 08/12/2009 >60   . Alkaline Phosphatase 08/12/2009 01   . AST 08/12/2009 22   . ALT 08/12/2009 48   . Total Protein 08/12/2009 4.6   . Albumin 08/12/2009 1.8   . Calcium 08/12/2009 7.6      Results for this Opt Visit:     Results for orders placed in visit on 08/12/09  CONVERTED CEM[facility] LAB      Component Value Range   Sodium 136     Potassium 3.4     Chloride 99     CO2 27.0     Glucose, Bld 96     BUN 4     Creatinine, Ser 0.20     GFR calc non Af Amer >60     Glomerular Filtration Rate, Af Am >60     Alkaline Phosphatase 01     AST 22     ALT 48     Total Protein 4.6     Albumin 1.8     Calcium 7.6      EKG Orders placed in visit on 03/24/11  . EKG 12-LEAD     Prior Assessment and Plan Problem List as of 01/24/2012            Cardiology Problems   HYPERLIPIDEMIA   Last Assessment & Plan Note   06/24/2011 Office Visit Addendum 06/29/2011  6:41 PM by Kathlen Brunswick, MD    No lipid profile has been obtained since pravastatin was started last year.  We will remedy that.    HYPERTENSION   Last Assessment & Plan Note   06/24/2011 Office Visit Signed 06/24/2011  1:17 PM by Kathlen Brunswick, MD    Blood pressure control is good; current medication will be continued.    MI, acute, non ST segment elevation  Last Assessment & Plan Note   06/24/2011 Office Visit Signed 06/24/2011  2:54 PM by Kathlen Brunswick, MD    Ms. Prestage has an area of hypoperfused myocardium in the distribution of her original circumflex event 15 years ago.  In the absence of symptoms and considering the patient's advanced age, no further evaluation or treatment is warranted.      Other   Gastroesophageal reflux disease   Last Assessment & Plan Note   06/24/2011 Office Visit Addendum 06/24/2011  1:16 PM by Kathlen Brunswick, MD    Patient is being treated with a nonsteroidal plus aspirin.  While she  derives some protection from treatment with a PPI, she should be followed closely to exclude occult GI blood loss, , especially since she is already anemic.  Stool samples for Hemoccult testing had been requested.    BILIARY TRACT DISORDER   Last Assessment & Plan Note   06/24/2011 Office Visit Signed 06/24/2011  2:51 PM by Kathlen Brunswick, MD    Mildly abnormal LFTs with previous evaluation demonstrating minor chronic biliary disease.  Continued observation is appropriate.    DEGENERATIVE JOINT DISEASE   OSTEOPENIA   Asthmatic bronchitis , chronic   Last Assessment & Plan Note   06/24/2011 Office Visit Signed 06/24/2011  1:14 PM by Kathlen Brunswick, MD    Excellent medical regime has been prescribed for patient's chronic lung disease.  Once pneumonia has been adequately treated, respiratory status should improve.    Repeated falls   Macular degeneration   Anemia   Last Assessment & Plan Note   06/24/2011 Office Visit Signed 06/24/2011  1:18 PM by Kathlen Brunswick, MD    H&H of 11.9/37.7 in 04/2008 and 10.9/33.4 in 02/2011.  Iron studies will be obtained as well as stool for Hemoccult testing.  If results are not helpful in establishing an etiology, Dr. Neita Carp may wish to perform additional diagnostic testing.        Imaging: No results found.   FRS Calculation: Score not calculated

## 2012-01-24 NOTE — Assessment & Plan Note (Signed)
Control of blood pressure has been excellent both in office and had the [facility].  Current therapy will be continued.

## 2012-01-24 NOTE — Assessment & Plan Note (Signed)
Anemia has resolved without any specific etiology being identified.  Mild macrocytosis persists.

## 2012-01-24 NOTE — Patient Instructions (Addendum)
Your physician recommends that you schedule a follow-up appointment in: 1 year  

## 2012-01-24 NOTE — Assessment & Plan Note (Signed)
Hyperlipidemia remains under excellent control with current modest therapy, which will be continued.

## 2012-01-24 NOTE — Assessment & Plan Note (Signed)
No falls since entering the [facility] in 2009

## 2012-01-24 NOTE — Progress Notes (Signed)
Patient ID: Candice Meza, female   DOB: 1921/11/18, 78 y.o.   MRN: [id number]  HPI: Annual visit for this delightful woman with multiple cardiovascular risk factors, previous myocardial infarction but only modest coronary disease on coronary angiography in 1997.  Since that time, she has had no further cardiac problems.  Hypertension and hyperlipidemia are well controlled.  She enjoys generally good health, but suffered multiple falls a few years ago and has resided safely in a nursing facility since.  Recently, she has had increasing difficulty with ambulation and is now receiving physical therapy, but does not ambulate independently.  Her only significant medical issue over the past 12 months was an episode of pneumonia treated as an outpatient.  Prior to Admission medications   Medication Sig Start Date End Date Taking? Authorizing Provider  acetaminophen (TYLENOL) 650 MG CR tablet Take 650 mg by mouth as needed.     Yes Historical Provider, MD  albuterol (PROVENTIL HFA;VENTOLIN HFA) 108 (90 BASE) MCG/ACT inhaler Inhale 2 puffs into the lungs every 4 (four) hours as needed.     Yes Historical Provider, MD  albuterol (PROVENTIL) (2.5 MG/3ML) 0.083% nebulizer solution Take 2.5 mg by nebulization every 4 (four) hours as needed.   Yes Historical Provider, MD  ALPRAZolam (XANAX) 0.25 MG tablet Take 0.25 mg by mouth daily.     Yes Historical Provider, MD  aspirin 81 MG tablet Take 81 mg by mouth daily.     Yes Historical Provider, MD  beclomethasone (QVAR) 80 MCG/ACT inhaler Inhale 1 puff into the lungs 2 (two) times daily.     Yes Historical Provider, MD  Calcium Carbonate (CALCIUM 600) 1500 MG TABS Take 1 tablet by mouth daily.     Yes Historical Provider, MD  cetirizine (ZYRTEC) 10 MG tablet Take 10 mg by mouth daily.     Yes Historical Provider, MD  citalopram (CELEXA) 20 MG tablet Take 20 mg by mouth daily.     Yes Historical Provider, MD  dextromethorphan (DELSYM) 30 MG/5ML liquid Take 60 mg by  mouth as needed.     Yes Historical Provider, MD  fluticasone (FLONASE) 50 MCG/ACT nasal spray Place 2 sprays into the nose daily.     Yes Historical Provider, MD  furosemide (LASIX) 20 MG tablet Take 20 mg by mouth daily.     Yes Historical Provider, MD  furosemide (LASIX) 40 MG tablet Take 40 mg by mouth as needed.   Yes Historical Provider, MD  guaiFENesin (MUCINEX) 600 MG 12 hr tablet Take 1,200 mg by mouth as needed.     Yes Historical Provider, MD  Multiple Vitamin (MULTIVITAMIN) tablet Take 1 tablet by mouth daily.     Yes Historical Provider, MD  nitroGLYCERIN (NITROSTAT) 0.4 MG SL tablet Place 0.4 mg under the tongue as directed.     Yes Historical Provider, MD  omeprazole (PRILOSEC) 40 MG capsule Take 40 mg by mouth daily.     Yes Historical Provider, MD  Polyethyl Glycol-Propyl Glycol (SYSTANE) 0.4-0.3 % SOLN Apply 1 drop to eye 3 (three) times daily.     Yes Historical Provider, MD  potassium chloride SA (K-DUR,KLOR-CON) 20 MEQ tablet Take 1 tablet (20 mEq total) by mouth daily. 06/24/11 06/23/12 Yes Kathlen Brunswick, MD  pravastatin (PRAVACHOL) 40 MG tablet Take 40 mg by mouth at bedtime.     Yes Historical Provider, MD  promethazine (PHENERGAN) 25 MG tablet Take 25 mg by mouth as needed.     Yes Historical Provider, MD  traMADol (ULTRAM) 50 MG tablet Take 50 mg by mouth as needed.     Yes Historical Provider, MD  vitamin E 400 UNIT capsule Take 400 Units by mouth daily.     Yes Historical Provider, MD  Wheat Dextrin (BENEFIBER) POWD Take by mouth 2 (two) times daily.     Yes Historical Provider, MD   Allergies  Allergen Reactions  . Aspirin     GI symptoms  . Bee Venom   . Morphine And Related   . Sulfa Antibiotics     dyspnea  . Tetracyclines & Related   . Codeine Rash  . Pamelor Rash  . Penicillins Rash     Past medical history, social history, and family history reviewed and updated.  ROS: Denies chest discomfort, dyspnea, orthopnea, PND, lightheadedness or syncope.   She has mild chronic pedal edema with rare exacerbations.  She requires an extra dose of furosemide only once in a great while.  No nausea, emesis, constipation or diarrhea.  All other systems reviewed and are negative.  PHYSICAL EXAM: BP 120/70  Pulse 64  Ht 4\' 11"  (1.499 m)  Wt 55.067 kg (121 lb 6.4 oz)  BMI 24.52 kg/m2  General-Well developed; no acute distress Body habitus-proportionate weight and height Neck-No JVD; Faint bilateral carotid bruits vs transmitted murmur Lungs-Bibasilar rales, partially clear with cough; resonant to percussion Cardiovascular-normal PMI; normal S1 and S2; Grade 1-2/6 systolic ejection murmur at the cardiac base; grade 1/6 diastolic blowing murmur at the upper left sternal border Abdomen-normal bowel sounds; soft and non-tender without masses or organomegaly Musculoskeletal-No deformities, no cyanosis or clubbing Neurologic-Normal cranial nerves; symmetric strength and tone Skin-Warm, no significant lesions Extremities-distal pulses intact; 1+ pretibial and ankle edema  ASSESSMENT AND PLAN:  [place] Bing, MD 01/24/2012 2:27 PM

## 2012-01-30 DIAGNOSIS — R279 Unspecified lack of coordination: Secondary | ICD-10-CM | POA: Diagnosis not present

## 2012-01-30 DIAGNOSIS — M069 Rheumatoid arthritis, unspecified: Secondary | ICD-10-CM | POA: Diagnosis not present

## 2012-01-30 DIAGNOSIS — J189 Pneumonia, unspecified organism: Secondary | ICD-10-CM | POA: Diagnosis not present

## 2012-01-30 DIAGNOSIS — R269 Unspecified abnormalities of gait and mobility: Secondary | ICD-10-CM | POA: Diagnosis not present

## 2012-01-30 DIAGNOSIS — K225 Diverticulum of esophagus, acquired: Secondary | ICD-10-CM | POA: Diagnosis not present

## 2012-01-30 DIAGNOSIS — R1312 Dysphagia, oropharyngeal phase: Secondary | ICD-10-CM | POA: Diagnosis not present

## 2012-02-23 DIAGNOSIS — B351 Tinea unguium: Secondary | ICD-10-CM | POA: Diagnosis not present

## 2012-02-23 DIAGNOSIS — M79609 Pain in unspecified limb: Secondary | ICD-10-CM | POA: Diagnosis not present

## 2012-02-26 DIAGNOSIS — E7800 Pure hypercholesterolemia, unspecified: Secondary | ICD-10-CM | POA: Diagnosis not present

## 2012-03-09 DIAGNOSIS — E7800 Pure hypercholesterolemia, unspecified: Secondary | ICD-10-CM | POA: Diagnosis not present

## 2012-03-09 DIAGNOSIS — Z79899 Other long term (current) drug therapy: Secondary | ICD-10-CM | POA: Diagnosis not present

## 2012-03-09 DIAGNOSIS — I10 Essential (primary) hypertension: Secondary | ICD-10-CM | POA: Diagnosis not present

## 2012-04-15 DIAGNOSIS — N390 Urinary tract infection, site not specified: Secondary | ICD-10-CM | POA: Diagnosis not present

## 2012-04-17 ENCOUNTER — Encounter (HOSPITAL_COMMUNITY): Payer: Self-pay

## 2012-04-17 ENCOUNTER — Inpatient Hospital Stay (HOSPITAL_COMMUNITY)
Admission: EM | Admit: 2012-04-17 | Discharge: 2012-04-18 | DRG: 445 | Disposition: A | Discharge status: Other Acute Inpatient Hospital | Payer: Medicare Other | Attending: Internal Medicine | Admitting: Internal Medicine

## 2012-04-17 ENCOUNTER — Emergency Department (HOSPITAL_COMMUNITY): Payer: Medicare Other

## 2012-04-17 DIAGNOSIS — K225 Diverticulum of esophagus, acquired: Secondary | ICD-10-CM | POA: Diagnosis not present

## 2012-04-17 DIAGNOSIS — Z823 Family history of stroke: Secondary | ICD-10-CM | POA: Diagnosis not present

## 2012-04-17 DIAGNOSIS — Z8249 Family history of ischemic heart disease and other diseases of the circulatory system: Secondary | ICD-10-CM | POA: Diagnosis not present

## 2012-04-17 DIAGNOSIS — Z87440 Personal history of urinary (tract) infections: Secondary | ICD-10-CM | POA: Diagnosis not present

## 2012-04-17 DIAGNOSIS — K449 Diaphragmatic hernia without obstruction or gangrene: Secondary | ICD-10-CM | POA: Diagnosis not present

## 2012-04-17 DIAGNOSIS — E785 Hyperlipidemia, unspecified: Secondary | ICD-10-CM | POA: Diagnosis present

## 2012-04-17 DIAGNOSIS — R1110 Vomiting, unspecified: Secondary | ICD-10-CM

## 2012-04-17 DIAGNOSIS — K5710 Diverticulosis of small intestine without perforation or abscess without bleeding: Secondary | ICD-10-CM | POA: Diagnosis present

## 2012-04-17 DIAGNOSIS — M899 Disorder of bone, unspecified: Secondary | ICD-10-CM | POA: Diagnosis present

## 2012-04-17 DIAGNOSIS — N390 Urinary tract infection, site not specified: Secondary | ICD-10-CM | POA: Diagnosis not present

## 2012-04-17 DIAGNOSIS — I252 Old myocardial infarction: Secondary | ICD-10-CM | POA: Diagnosis not present

## 2012-04-17 DIAGNOSIS — Z880 Allergy status to penicillin: Secondary | ICD-10-CM | POA: Diagnosis not present

## 2012-04-17 DIAGNOSIS — M549 Dorsalgia, unspecified: Secondary | ICD-10-CM | POA: Diagnosis not present

## 2012-04-17 DIAGNOSIS — R7402 Elevation of levels of lactic acid dehydrogenase (LDH): Secondary | ICD-10-CM | POA: Diagnosis not present

## 2012-04-17 DIAGNOSIS — K8309 Other cholangitis: Principal | ICD-10-CM | POA: Diagnosis present

## 2012-04-17 DIAGNOSIS — Z882 Allergy status to sulfonamides status: Secondary | ICD-10-CM

## 2012-04-17 DIAGNOSIS — K8020 Calculus of gallbladder without cholecystitis without obstruction: Secondary | ICD-10-CM | POA: Diagnosis not present

## 2012-04-17 DIAGNOSIS — I2510 Atherosclerotic heart disease of native coronary artery without angina pectoris: Secondary | ICD-10-CM | POA: Diagnosis present

## 2012-04-17 DIAGNOSIS — R112 Nausea with vomiting, unspecified: Secondary | ICD-10-CM | POA: Diagnosis not present

## 2012-04-17 DIAGNOSIS — R109 Unspecified abdominal pain: Secondary | ICD-10-CM | POA: Diagnosis not present

## 2012-04-17 DIAGNOSIS — K838 Other specified diseases of biliary tract: Secondary | ICD-10-CM | POA: Diagnosis not present

## 2012-04-17 DIAGNOSIS — J986 Disorders of diaphragm: Secondary | ICD-10-CM | POA: Diagnosis not present

## 2012-04-17 DIAGNOSIS — N281 Cyst of kidney, acquired: Secondary | ICD-10-CM | POA: Diagnosis not present

## 2012-04-17 DIAGNOSIS — K839 Disease of biliary tract, unspecified: Secondary | ICD-10-CM | POA: Diagnosis present

## 2012-04-17 DIAGNOSIS — I10 Essential (primary) hypertension: Secondary | ICD-10-CM | POA: Diagnosis present

## 2012-04-17 LAB — CBC WITH DIFFERENTIAL/PLATELET
Basophils Relative: 0 % (ref 0–1)
Eosinophils Absolute: 0 10*3/uL (ref 0.0–0.7)
Eosinophils Relative: 0 % (ref 0–5)
Lymphs Abs: 0.5 10*3/uL — ABNORMAL LOW (ref 0.7–4.0)
MCH: 34.2 pg — ABNORMAL HIGH (ref 26.0–34.0)
MCHC: 34.5 g/dL (ref 30.0–36.0)
MCV: 99.2 fL (ref 78.0–100.0)
Monocytes Relative: 11 % (ref 3–12)
Neutrophils Relative %: 79 % — ABNORMAL HIGH (ref 43–77)
Platelets: 147 10*3/uL — ABNORMAL LOW (ref 150–400)

## 2012-04-17 LAB — COMPREHENSIVE METABOLIC PANEL
ALT: 292 U/L — ABNORMAL HIGH (ref 0–35)
Albumin: 3.1 g/dL — ABNORMAL LOW (ref 3.5–5.2)
Alkaline Phosphatase: 352 U/L — ABNORMAL HIGH (ref 39–117)
BUN: 20 mg/dL (ref 6–23)
Calcium: 9.3 mg/dL (ref 8.4–10.5)
GFR calc Af Amer: >90 mL/min (ref >90)
Glucose, Bld: 141 mg/dL — ABNORMAL HIGH (ref 70–99)
Potassium: 3.4 mEq/L — ABNORMAL LOW (ref 3.5–5.1)
Sodium: 136 mEq/L (ref 135–145)
Total Protein: 6.7 g/dL (ref 6.0–8.3)

## 2012-04-17 LAB — URINALYSIS, ROUTINE W REFLEX MICROSCOPIC
Leukocytes, UA: NEGATIVE
Nitrite: POSITIVE — AB
Specific Gravity, Urine: 1.025 (ref 1.005–1.030)
Urobilinogen, UA: 4 mg/dL — ABNORMAL HIGH (ref 0.0–1.0)
pH: 6 (ref 5.0–8.0)

## 2012-04-17 LAB — URINE MICROSCOPIC-ADD ON

## 2012-04-17 LAB — LIPASE, BLOOD: Lipase: 25 U/L (ref 11–59)

## 2012-04-17 MED ORDER — SODIUM CHLORIDE 0.9 % IV SOLN
Freq: Once | INTRAVENOUS | Status: AC
  Administered 2012-04-17: 22:00:00 via INTRAVENOUS

## 2012-04-17 MED ORDER — PANTOPRAZOLE SODIUM 40 MG IV SOLR
40.0000 mg | Freq: Once | INTRAVENOUS | Status: AC
  Administered 2012-04-17: 40 mg via INTRAVENOUS
  Filled 2012-04-17: qty 40

## 2012-04-17 MED ORDER — ONDANSETRON HCL 4 MG/2ML IJ SOLN
4.0000 mg | Freq: Once | INTRAMUSCULAR | Status: AC
  Administered 2012-04-17: 4 mg via INTRAVENOUS
  Filled 2012-04-17: qty 2

## 2012-04-17 NOTE — ED Notes (Signed)
Pt started on cipro this am for uti that was diagnosed yesterday,  States she has been nauseated and has vomited several times, also c/o upper abd pain

## 2012-04-17 NOTE — ED Provider Notes (Signed)
History   This chart was scribed for Benny Lennert, MD by Gerlean Ren. This patient was seen in room APA09/APA09 and the patient's care was started at 21:41.   CSN: [id number]  Arrival date & time 04/17/12  2019   First MD Initiated Contact with Patient 04/17/12 2140      Chief Complaint  Patient presents with  . Emesis    (Consider location/radiation/quality/duration/timing/severity/associated sxs/prior treatment) The history is provided by the patient. No language interpreter was used.   Candice Meza is a 78 y.o. female who presents to the Emergency Department complaining of constant nausea and numerous episodes of non-bloody, non-bilious emesis with associated abdominal "swelling and fullness" but no abdominal pain.  Pt was started on Cipro regiment this morning for bladder infection dx yesterday.  Pt denies any irregular bowel movements, fever, neck pain, sore throat, visual disturbance, CP, cough, dyspnea, urinary symptoms, back pain, HA, weakness, numbness and rash as associated symptoms.  Pt does not take O2 at home.  Pt has h/o MI and HTN.  Pt denies tobacco and alcohol use.     Past Medical History  Diagnosis Date  . MI, acute, non ST segment elevation 1997    Questionable lesion in the circumflex at catheterization; normal EF; chronic dyspnea without specific cause identified  . Hyperlipidemia   . Hypertension   . Postmenopausal     Estrogen replacement therapy  . Cough     Prolonged, possible asthmatic bronchitis  . Benign essential tremor   . Osteopenia     Versus osteoporosis  . Macular degeneration   . Degenerative joint disease     versus rheumatoid arthritis  . Repeated falls     Past Surgical History  Procedure Date  . Bladder suspension   . Inguinal hernia repair     Left  . Foot surgery     Bilateral    Family History  Problem Relation Age of Onset  . Coronary artery disease    . Stroke      History  Substance Use Topics  . Smoking  status: Never Smoker   . Smokeless tobacco: Never Used  . Alcohol Use: No    No OB history provided.  Review of Systems  Constitutional: Negative for fatigue.  HENT: Negative for congestion, sinus pressure and ear discharge.   Eyes: Negative for discharge.  Respiratory: Negative for cough.   Cardiovascular: Negative for chest pain.  Gastrointestinal: Positive for nausea and vomiting. Negative for abdominal pain and diarrhea.  Genitourinary: Negative for frequency and hematuria.  Musculoskeletal: Negative for back pain.  Skin: Negative for rash.  Neurological: Negative for seizures and headaches.  Hematological: Negative.   Psychiatric/Behavioral: Negative for hallucinations.    Allergies  Aspirin; Bee venom; Morphine and related; Sulfa antibiotics; Tetracyclines & related; Codeine; Pamelor; and Penicillins  Home Medications     BP 138/44  Pulse 66  Temp 98.4 F (36.9 C) (Oral)  Resp 18  Ht 4\' 11"  (1.499 m)  Wt 121 lb (54.885 kg)  BMI 24.44 kg/m2  SpO2 97%  Physical Exam  Nursing note and vitals reviewed. Constitutional: She is oriented to person, place, and time. She appears well-developed.  HENT:  Head: Normocephalic and atraumatic.  Eyes: Conjunctivae normal and EOM are normal. No scleral icterus.  Neck: Neck supple. No thyromegaly present.  Cardiovascular: Normal rate and regular rhythm.  Exam reveals no gallop and no friction rub.   No murmur heard. Pulmonary/Chest: Effort normal. No stridor. She has wheezes.  She has no rales. She exhibits no tenderness.       Mild wheezing in bilateral lungs.  Abdominal: Soft. She exhibits no distension. There is tenderness. There is no rebound.       Minimal epigastric tenderness.  Musculoskeletal: Normal range of motion. She exhibits no edema.  Lymphadenopathy:    She has no cervical adenopathy.  Neurological: She is oriented to person, place, and time. Coordination normal.  Skin: No rash noted. No erythema.    Psychiatric: She has a normal mood and affect. Her behavior is normal.    ED Course  Procedures (including critical care time) DIAGNOSTIC STUDIES:    COORDINATION OF CARE: 21:46- Patient informed of clinical course, understands medical decision-making process, and agrees with plan.  Ordered IV fluids, IV protonix, IV zofran, c-met, lipase, urinalysis, CBC, and abdominal chest XR.    Labs Reviewed - No data to display No results found.   No diagnosis found.    MDM     The chart was scribed for me under my direct supervision.  I personally performed the history, physical, and medical decision making and all procedures in the evaluation of this patient.Benny Lennert, MD 04/17/12 [phone]

## 2012-04-18 ENCOUNTER — Encounter (HOSPITAL_COMMUNITY): Payer: Self-pay | Admitting: Anesthesiology

## 2012-04-18 ENCOUNTER — Inpatient Hospital Stay (HOSPITAL_COMMUNITY): Payer: Medicare Other

## 2012-04-18 ENCOUNTER — Inpatient Hospital Stay (HOSPITAL_COMMUNITY): Payer: Medicare Other | Admitting: Anesthesiology

## 2012-04-18 ENCOUNTER — Encounter (HOSPITAL_COMMUNITY): Payer: Self-pay | Admitting: General Practice

## 2012-04-18 ENCOUNTER — Encounter (HOSPITAL_COMMUNITY)
Admission: EM | Disposition: A | Discharge status: Other Acute Inpatient Hospital | Payer: Self-pay | Source: Home / Self Care | Attending: Internal Medicine

## 2012-04-18 DIAGNOSIS — R109 Unspecified abdominal pain: Secondary | ICD-10-CM | POA: Diagnosis present

## 2012-04-18 DIAGNOSIS — R5383 Other fatigue: Secondary | ICD-10-CM | POA: Diagnosis not present

## 2012-04-18 DIAGNOSIS — R1110 Vomiting, unspecified: Secondary | ICD-10-CM | POA: Diagnosis not present

## 2012-04-18 DIAGNOSIS — K225 Diverticulum of esophagus, acquired: Secondary | ICD-10-CM | POA: Diagnosis present

## 2012-04-18 DIAGNOSIS — N281 Cyst of kidney, acquired: Secondary | ICD-10-CM | POA: Diagnosis not present

## 2012-04-18 DIAGNOSIS — K8020 Calculus of gallbladder without cholecystitis without obstruction: Secondary | ICD-10-CM | POA: Diagnosis not present

## 2012-04-18 DIAGNOSIS — M069 Rheumatoid arthritis, unspecified: Secondary | ICD-10-CM | POA: Diagnosis not present

## 2012-04-18 DIAGNOSIS — K219 Gastro-esophageal reflux disease without esophagitis: Secondary | ICD-10-CM | POA: Diagnosis present

## 2012-04-18 DIAGNOSIS — E785 Hyperlipidemia, unspecified: Secondary | ICD-10-CM | POA: Diagnosis present

## 2012-04-18 DIAGNOSIS — R112 Nausea with vomiting, unspecified: Secondary | ICD-10-CM | POA: Diagnosis present

## 2012-04-18 DIAGNOSIS — N390 Urinary tract infection, site not specified: Secondary | ICD-10-CM | POA: Diagnosis not present

## 2012-04-18 DIAGNOSIS — E876 Hypokalemia: Secondary | ICD-10-CM | POA: Diagnosis not present

## 2012-04-18 DIAGNOSIS — K839 Disease of biliary tract, unspecified: Secondary | ICD-10-CM | POA: Diagnosis not present

## 2012-04-18 DIAGNOSIS — Z9181 History of falling: Secondary | ICD-10-CM | POA: Diagnosis not present

## 2012-04-18 DIAGNOSIS — K5710 Diverticulosis of small intestine without perforation or abscess without bleeding: Secondary | ICD-10-CM

## 2012-04-18 DIAGNOSIS — R6889 Other general symptoms and signs: Secondary | ICD-10-CM | POA: Diagnosis not present

## 2012-04-18 DIAGNOSIS — I214 Non-ST elevation (NSTEMI) myocardial infarction: Secondary | ICD-10-CM | POA: Diagnosis not present

## 2012-04-18 DIAGNOSIS — I10 Essential (primary) hypertension: Secondary | ICD-10-CM | POA: Diagnosis present

## 2012-04-18 DIAGNOSIS — K8309 Other cholangitis: Secondary | ICD-10-CM | POA: Diagnosis not present

## 2012-04-18 DIAGNOSIS — Z87440 Personal history of urinary (tract) infections: Secondary | ICD-10-CM | POA: Diagnosis not present

## 2012-04-18 DIAGNOSIS — R1084 Generalized abdominal pain: Secondary | ICD-10-CM | POA: Diagnosis not present

## 2012-04-18 DIAGNOSIS — J189 Pneumonia, unspecified organism: Secondary | ICD-10-CM | POA: Diagnosis not present

## 2012-04-18 DIAGNOSIS — I252 Old myocardial infarction: Secondary | ICD-10-CM | POA: Diagnosis not present

## 2012-04-18 DIAGNOSIS — J986 Disorders of diaphragm: Secondary | ICD-10-CM | POA: Diagnosis not present

## 2012-04-18 DIAGNOSIS — K449 Diaphragmatic hernia without obstruction or gangrene: Secondary | ICD-10-CM | POA: Diagnosis not present

## 2012-04-18 DIAGNOSIS — K8050 Calculus of bile duct without cholangitis or cholecystitis without obstruction: Secondary | ICD-10-CM | POA: Diagnosis not present

## 2012-04-18 DIAGNOSIS — R945 Abnormal results of liver function studies: Secondary | ICD-10-CM | POA: Diagnosis not present

## 2012-04-18 DIAGNOSIS — K838 Other specified diseases of biliary tract: Secondary | ICD-10-CM | POA: Diagnosis not present

## 2012-04-18 DIAGNOSIS — I2510 Atherosclerotic heart disease of native coronary artery without angina pectoris: Secondary | ICD-10-CM | POA: Diagnosis not present

## 2012-04-18 DIAGNOSIS — I259 Chronic ischemic heart disease, unspecified: Secondary | ICD-10-CM | POA: Diagnosis not present

## 2012-04-18 DIAGNOSIS — R9431 Abnormal electrocardiogram [ECG] [EKG]: Secondary | ICD-10-CM | POA: Diagnosis not present

## 2012-04-18 HISTORY — PX: ERCP: SHX5425

## 2012-04-18 LAB — COMPREHENSIVE METABOLIC PANEL
ALT: 306 U/L — ABNORMAL HIGH (ref 0–35)
AST: 342 U/L — ABNORMAL HIGH (ref 0–37)
Albumin: 2.8 g/dL — ABNORMAL LOW (ref 3.5–5.2)
Calcium: 8.9 mg/dL (ref 8.4–10.5)
Sodium: 135 mEq/L (ref 135–145)
Total Protein: 6.5 g/dL (ref 6.0–8.3)

## 2012-04-18 LAB — TROPONIN I: Troponin I: <0.3 ng/mL (ref <0.30)

## 2012-04-18 LAB — CBC
HCT: 36.8 % (ref 36.0–46.0)
Hemoglobin: 12.4 g/dL (ref 12.0–15.0)
MCH: 33.6 pg (ref 26.0–34.0)
MCHC: 33.7 g/dL (ref 30.0–36.0)
MCV: 99.7 fL (ref 78.0–100.0)

## 2012-04-18 LAB — MRSA PCR SCREENING: MRSA by PCR: NEGATIVE

## 2012-04-18 LAB — PROTIME-INR
INR: 0.99 (ref 0.00–1.49)
Prothrombin Time: 13 seconds (ref 11.6–15.2)

## 2012-04-18 LAB — TSH: TSH: 0.936 u[IU]/mL (ref 0.350–4.500)

## 2012-04-18 SURGERY — Surgical Case
Anesthesia: *Unknown

## 2012-04-18 SURGERY — ERCP, WITH INTERVENTION IF INDICATED
Anesthesia: General

## 2012-04-18 MED ORDER — EPHEDRINE SULFATE 50 MG/ML IJ SOLN
INTRAMUSCULAR | Status: DC | PRN
  Administered 2012-04-18: 15 mg via INTRAVENOUS

## 2012-04-18 MED ORDER — FENTANYL CITRATE 0.05 MG/ML IJ SOLN
INTRAMUSCULAR | Status: DC | PRN
  Administered 2012-04-18: 50 ug via INTRAVENOUS
  Administered 2012-04-18: 25 ug via INTRAVENOUS

## 2012-04-18 MED ORDER — ALBUTEROL SULFATE (5 MG/ML) 0.5% IN NEBU: 2.5000 mg | INHALATION_SOLUTION | RESPIRATORY_TRACT | Status: DC | PRN

## 2012-04-18 MED ORDER — LEVOFLOXACIN IN D5W 250 MG/50ML IV SOLN
INTRAVENOUS | Status: AC
  Filled 2012-04-18: qty 50

## 2012-04-18 MED ORDER — SODIUM CHLORIDE 0.9 % IV SOLN
INTRAVENOUS | Status: DC | PRN
  Administered 2012-04-18: 16:00:00

## 2012-04-18 MED ORDER — DEXTROSE-NACL 5-0.45 % IV SOLN
INTRAVENOUS | Status: AC
  Administered 2012-04-18: 03:00:00 via INTRAVENOUS

## 2012-04-18 MED ORDER — MIDAZOLAM HCL 5 MG/5ML IJ SOLN
INTRAMUSCULAR | Status: DC | PRN
  Administered 2012-04-18: 1 mg via INTRAVENOUS

## 2012-04-18 MED ORDER — POTASSIUM CHLORIDE 10 MEQ/100ML IV SOLN
10.0000 meq | INTRAVENOUS | Status: AC
  Administered 2012-04-18 (×2): 10 meq via INTRAVENOUS

## 2012-04-18 MED ORDER — SODIUM CHLORIDE 0.9 % IJ SOLN
3.0000 mL | Freq: Two times a day (BID) | INTRAMUSCULAR | Status: DC
  Administered 2012-04-18: 3 mL via INTRAVENOUS
  Filled 2012-04-18: qty 3

## 2012-04-18 MED ORDER — ALPRAZOLAM 0.25 MG PO TABS: 0.2500 mg | ORAL_TABLET | Freq: Three times a day (TID) | ORAL | Status: DC | PRN

## 2012-04-18 MED ORDER — SUCCINYLCHOLINE CHLORIDE 20 MG/ML IJ SOLN
INTRAMUSCULAR | Status: DC | PRN
  Administered 2012-04-18: 110 mg via INTRAVENOUS

## 2012-04-18 MED ORDER — SIMVASTATIN 20 MG PO TABS: 20.0000 mg | ORAL_TABLET | Freq: Every day | ORAL | Status: DC

## 2012-04-18 MED ORDER — IOHEXOL 300 MG/ML  SOLN
100.0000 mL | Freq: Once | INTRAMUSCULAR | Status: AC | PRN
  Administered 2012-04-18: 100 mL via INTRAVENOUS

## 2012-04-18 MED ORDER — METRONIDAZOLE IN NACL 5-0.79 MG/ML-% IV SOLN
INTRAVENOUS | Status: AC
  Filled 2012-04-18: qty 100

## 2012-04-18 MED ORDER — STERILE WATER FOR IRRIGATION IR SOLN
Status: DC | PRN
  Administered 2012-04-18: 15:00:00

## 2012-04-18 MED ORDER — METRONIDAZOLE IN NACL 5-0.79 MG/ML-% IV SOLN
500.0000 mg | Freq: Three times a day (TID) | INTRAVENOUS | Status: DC
  Administered 2012-04-18: 500 mg via INTRAVENOUS
  Filled 2012-04-18 (×4): qty 100

## 2012-04-18 MED ORDER — GLUCAGON HCL (RDNA) 1 MG IJ SOLR
INTRAMUSCULAR | Status: DC | PRN
  Administered 2012-04-18 (×2): 0.25 mg via INTRAVENOUS

## 2012-04-18 MED ORDER — ONDANSETRON HCL 4 MG PO TABS: 4.0000 mg | ORAL_TABLET | Freq: Four times a day (QID) | ORAL | Status: DC | PRN

## 2012-04-18 MED ORDER — FENTANYL CITRATE 0.05 MG/ML IJ SOLN
INTRAMUSCULAR | Status: AC
  Filled 2012-04-18: qty 2

## 2012-04-18 MED ORDER — LIDOCAINE HCL 1 % IJ SOLN
INTRAMUSCULAR | Status: DC | PRN
  Administered 2012-04-18: 35 mg via INTRADERMAL

## 2012-04-18 MED ORDER — ONDANSETRON HCL 4 MG/2ML IJ SOLN
4.0000 mg | Freq: Four times a day (QID) | INTRAMUSCULAR | Status: DC | PRN
  Administered 2012-04-18: 4 mg via INTRAVENOUS
  Filled 2012-04-18: qty 2

## 2012-04-18 MED ORDER — SODIUM CHLORIDE 0.9 % IV SOLN
INTRAVENOUS | Status: DC
  Administered 2012-04-18: 20 mL/h via INTRAVENOUS

## 2012-04-18 MED ORDER — LEVOFLOXACIN IN D5W 250 MG/50ML IV SOLN
250.0000 mg | Freq: Every day | INTRAVENOUS | Status: DC
  Administered 2012-04-18: 250 mg via INTRAVENOUS
  Filled 2012-04-18 (×2): qty 50

## 2012-04-18 MED ORDER — ETOMIDATE 2 MG/ML IV SOLN
INTRAVENOUS | Status: DC | PRN
  Administered 2012-04-18: 10 mg via INTRAVENOUS

## 2012-04-18 MED ORDER — PANTOPRAZOLE SODIUM 40 MG IV SOLR: 40.0000 mg | Freq: Every day | INTRAVENOUS | Status: DC

## 2012-04-18 MED ORDER — EPHEDRINE SULFATE 50 MG/ML IJ SOLN
INTRAMUSCULAR | Status: AC
  Filled 2012-04-18: qty 1

## 2012-04-18 MED ORDER — CITALOPRAM HYDROBROMIDE 20 MG PO TABS
30.0000 mg | ORAL_TABLET | Freq: Every day | ORAL | Status: DC
  Administered 2012-04-18: 30 mg via ORAL
  Filled 2012-04-18: qty 2

## 2012-04-18 MED ORDER — LACTATED RINGERS IV SOLN
INTRAVENOUS | Status: DC | PRN
  Administered 2012-04-18: 14:00:00 via INTRAVENOUS
  Administered 2012-04-18: 1000 mL

## 2012-04-18 MED ORDER — HYDROMORPHONE HCL PF 1 MG/ML IJ SOLN: 0.5000 mg | INTRAMUSCULAR | Status: DC | PRN

## 2012-04-18 MED ORDER — MIDAZOLAM HCL 2 MG/2ML IJ SOLN
INTRAMUSCULAR | Status: AC
  Filled 2012-04-18: qty 2

## 2012-04-18 MED ORDER — POTASSIUM CHLORIDE 10 MEQ/100ML IV SOLN
INTRAVENOUS | Status: AC
  Administered 2012-04-18: 10 meq via INTRAVENOUS
  Filled 2012-04-18: qty 200

## 2012-04-18 MED ORDER — FLUTICASONE PROPIONATE HFA 44 MCG/ACT IN AERO
2.0000 | INHALATION_SPRAY | Freq: Two times a day (BID) | RESPIRATORY_TRACT | Status: DC
  Administered 2012-04-18 (×2): 2 via RESPIRATORY_TRACT
  Filled 2012-04-18: qty 10.6

## 2012-04-18 SURGICAL SUPPLY — 28 items
BAG HAMPER (MISCELLANEOUS) ×2 IMPLANT
BALLN RETRIEVAL 12X15 (BALLOONS) IMPLANT
BALN RTRVL 200 6-7FR 12-15 (BALLOONS)
BASKET TRAPEZOID 3X6 (MISCELLANEOUS) IMPLANT
BSKT STON RTRVL TRAPEZOID 3X6 (MISCELLANEOUS)
DEVICE INFLATION ENCORE 26 (MISCELLANEOUS) IMPLANT
DEVICE LOCKING W-BIOPSY CAP (MISCELLANEOUS) ×2 IMPLANT
ELECT REM PT RETURN 9FT ADLT (ELECTROSURGICAL) ×2
ELECTRODE REM PT RTRN 9FT ADLT (ELECTROSURGICAL) IMPLANT
GUIDEWIRE HYDRA JAGWIRE .35 (WIRE) ×1 IMPLANT
GUIDEWIRE JAG HINI 025X260CM (WIRE) IMPLANT
KIT ROOM TURNOVER APOR (KITS) ×2 IMPLANT
LUBRICANT JELLY 4.5OZ STERILE (MISCELLANEOUS) ×1 IMPLANT
NDL HYPO 18GX1.5 BLUNT FILL (NEEDLE) IMPLANT
NEEDLE HYPO 18GX1.5 BLUNT FILL (NEEDLE) IMPLANT
PAD ARMBOARD 7.5X6 YLW CONV (MISCELLANEOUS) ×3 IMPLANT
PATHFINDER 450CM 0.18 (STENTS) IMPLANT
SNARE ROTATE MED OVAL 20MM (MISCELLANEOUS) IMPLANT
SPHINCTEROTOME AUTOTOME .25 (MISCELLANEOUS) IMPLANT
SPHINCTEROTOME HYDRATOME 44 (MISCELLANEOUS) ×2 IMPLANT
SPONGE GAUZE 4X4 12PLY (GAUZE/BANDAGES/DRESSINGS) ×2 IMPLANT
SYR 3ML LL SCALE MARK (SYRINGE) IMPLANT
SYR 50ML LL SCALE MARK (SYRINGE) ×1 IMPLANT
SYSTEM CONTINUOUS INJECTION (MISCELLANEOUS) ×2 IMPLANT
TUBING ENDO SMARTCAP PENTAX (MISCELLANEOUS) ×2 IMPLANT
WALLSTENT METAL COVERED 10X60 (STENTS) IMPLANT
WALLSTENT METAL COVERED 10X80 (STENTS) IMPLANT
WATER STERILE IRR 1000ML POUR (IV SOLUTION) ×3 IMPLANT

## 2012-04-18 NOTE — Progress Notes (Signed)
Patient has orders for Potassium chloride 10 meq IV. Patient in procedures for most of day. Dr. Karilyn Cota notified of Potassium not given at scheduled time. Orders to give now.

## 2012-04-18 NOTE — ED Notes (Signed)
Patient stated to CT tech that she aspirates if she uses a straw. Dr. Rito Ehrlich states that patient it to remain in the ER until she has her CT. CT tech states that it will be an hour and a half before she can go over for the scan.

## 2012-04-18 NOTE — Progress Notes (Signed)
ANTIBIOTIC CONSULT NOTE - INITIAL  Pharmacy Consult for Levaquin Indication: UTI  Allergies  Allergen Reactions  . Aspirin     GI symptoms  . Bee Venom   . Morphine And Related   . Sulfa Antibiotics     dyspnea  . Tetracyclines & Related   . Codeine Rash  . Pamelor Rash  . Penicillins Rash   Patient Measurements: Height: 4\' 11"  (149.9 cm) Weight: 119 lb 14.9 oz (54.4 kg) IBW/kg (Calculated) : 43.2   Vital Signs: Temp: 99.7 F (37.6 C) (10/16 0447) Temp src: Oral (10/16 0447) BP: 119/63 mmHg (10/16 0447) Pulse Rate: 74  (10/16 0722) Intake/Output from previous day: 10/15 0701 - 10/16 0700 In: 125.8 [I.V.:125.8] Out: -  Intake/Output from this shift:    Labs:  [facility] 04/18/12 0442 04/17/12 2152  WBC 5.9 5.1  HGB 12.4 12.5  PLT 154 147*  LABCREA -- --  CREATININE 0.30* 0.34*   Estimated Creatinine Clearance: 36.6 ml/min (by C-G formula based on Cr of 0.3). No results found for this basename: VANCOTROUGH:2,VANCOPEAK:2,VANCORANDOM:2,GENTTROUGH:2,GENTPEAK:2,GENTRANDOM:2,TOBRATROUGH:2,TOBRAPEAK:2,TOBRARND:2,AMIKACINPEAK:2,AMIKACINTROU:2,AMIKACIN:2, in the last 72 hours   Microbiology: Recent Results (from the past 720 hour(s))  MRSA PCR SCREENING     Status: Normal   Collection Time   04/18/12  3:38 AM      Component Value Range Status Comment   MRSA by PCR NEGATIVE  NEGATIVE Final    Medical History: Past Medical History  Diagnosis Date  . MI, acute, non ST segment elevation 1997    Questionable lesion in the circumflex at catheterization; normal EF; chronic dyspnea without specific cause identified  . Hyperlipidemia   . Hypertension   . Postmenopausal     Estrogen replacement therapy  . Cough     Prolonged, possible asthmatic bronchitis  . Benign essential tremor   . Osteopenia     Versus osteoporosis  . Macular degeneration   . Degenerative joint disease     versus rheumatoid arthritis  . Repeated falls    Medications:  Scheduled:    .  sodium chloride   Intravenous Once  . citalopram  30 mg Oral Daily  . fluticasone  2 puff Inhalation BID  . levofloxacin (LEVAQUIN) IV  250 mg Intravenous QHS  . ondansetron  4 mg Intravenous Once  . pantoprazole (PROTONIX) IV  40 mg Intravenous Once  . pantoprazole (PROTONIX) IV  40 mg Intravenous QHS  . simvastatin  20 mg Oral q1800  . sodium chloride  3 mL Intravenous Q12H   Assessment: 78yo female admitted with UTI and started on Levaquin 250mg  IV q24hrs.  SCr OK for age but age related reduced renal fxn.  Estimated Creatinine Clearance: 36.6 ml/min (by C-G formula based on Cr of 0.3).  Goal of Therapy:  Eradicate infection.  Plan: Continue Levaquin 250mg  iv q24hrs Monitor labs, renal fxn, cultures, and pt progress  Margo Aye, Arhaan Chesnut A 04/18/2012,10:09 AM

## 2012-04-18 NOTE — H&P (Signed)
Candice Meza is an 78 y.o. female.    PCP: Estanislado Pandy, MD   Chief Complaint: Abdominal pain  HPI: This is a 78 year old, Caucasian female, who lives in a nursing facility, who presented to the hospital due to complaints of abdominal pain, nausea and vomiting. She has a history of coronary artery disease. She was in her usual state of health till Friday when she started having lower abdominal discomfort. This started relocating to the upper abdomen. She had episodes of nausea, vomiting. Patient appears to have some form of cognitive, impairment. She cannot remember all the details. She repeats herself at times. So history is not completely reliable. She tells me that the pain is continuous. It is a gnawing type of pain and is 10 out of 10 in intensity. She tells me it is not related to food intake. Denies any diarrhea. She had multiple episodes of emesis. She had some chills but she also has a history of essential tremors. She had some pain with urination, and was recently diagnosed with a UTI and was started on ciprofloxacin yesterday. She tells me that she had a "gallbladder attack" about 3 years ago and had to be admitted at [facility] in [place]. She at that time was told that she was not a surgical candidate. She also tells me that she has chest pressure once in a while. Overall she is not a very good historian probably because of her cognitive, impairment.   Home Medications: Prior to Admission medications   Medication Sig Start Date End Date Taking? Authorizing Provider  acetaminophen (TYLENOL) 650 MG CR tablet Take 1,300 mg by mouth 2 (two) times daily as needed. For pain   Yes Historical Provider, MD  albuterol (PROVENTIL) (2.5 MG/3ML) 0.083% nebulizer solution Take 2.5 mg by nebulization every 4 (four) hours as needed.   Yes Historical Provider, MD  ALPRAZolam (XANAX) 0.25 MG tablet Take 0.25 mg by mouth every 8 (eight) hours as needed. For anxiety   Yes Historical Provider, MD   aspirin 81 MG tablet Take 81 mg by mouth daily.     Yes Historical Provider, MD  beclomethasone (QVAR) 80 MCG/ACT inhaler Inhale 1 puff into the lungs 2 (two) times daily.     Yes Historical Provider, MD  Calcium Carb-Cholecalciferol (CALCIUM 500 +D) 500-400 MG-UNIT TABS Take 1 tablet by mouth daily.   Yes Historical Provider, MD  cetirizine (ZYRTEC) 10 MG tablet Take 10 mg by mouth daily.     Yes Historical Provider, MD  citalopram (CELEXA) 10 MG tablet Take 30 mg by mouth daily.   Yes Historical Provider, MD  dextromethorphan (DELSYM) 30 MG/5ML liquid Take 60 mg by mouth 2 (two) times daily as needed. For  cough   Yes Historical Provider, MD  fluticasone (FLONASE) 50 MCG/ACT nasal spray Place 2 sprays into the nose daily.     Yes Historical Provider, MD  furosemide (LASIX) 20 MG tablet Take 20 mg by mouth daily.     Yes Historical Provider, MD  furosemide (LASIX) 40 MG tablet Take 40 mg by mouth as needed.   Yes Historical Provider, MD  GUAIFENESIN PO Take 600 mg by mouth 2 (two) times daily.   Yes Historical Provider, MD  Multiple Vitamin (MULTIVITAMIN) tablet Take 1 tablet by mouth daily.     Yes Historical Provider, MD  nabumetone (RELAFEN) 500 MG tablet Take 500 mg by mouth 2 (two) times daily.   Yes Historical Provider, MD  nitroGLYCERIN (NITROSTAT) 0.4 MG SL  tablet Place 0.4 mg under the tongue as directed.     Yes Historical Provider, MD  omeprazole (PRILOSEC) 40 MG capsule Take 40 mg by mouth daily.     Yes Historical Provider, MD  Polyethyl Glycol-Propyl Glycol (SYSTANE) 0.4-0.3 % SOLN Apply 1 drop to eye 3 (three) times daily.     Yes Historical Provider, MD  polyethylene glycol powder (GLYCOLAX/MIRALAX) powder Take 17 g by mouth daily.   Yes Historical Provider, MD  potassium chloride (K-DUR) 10 MEQ tablet Take 20 mEq by mouth daily.   Yes Historical Provider, MD  pravastatin (PRAVACHOL) 40 MG tablet Take 40 mg by mouth at bedtime.     Yes Historical Provider, MD  promethazine  (PHENERGAN) 25 MG tablet Take 12.5 mg by mouth every 4 (four) hours as needed. For nausea   Yes Historical Provider, MD  traMADol (ULTRAM) 50 MG tablet Take 50 mg by mouth every 6 (six) hours as needed. For pain   Yes Historical Provider, MD  vitamin E 400 UNIT capsule Take 400 Units by mouth daily.     Yes Historical Provider, MD  Wheat Dextrin (BENEFIBER PO) Take 30 mLs by mouth 2 (two) times daily. Mix two tablespoons in 8 ounces of water or juice and drink daily   Yes Historical Provider, MD  albuterol (PROVENTIL HFA;VENTOLIN HFA) 108 (90 BASE) MCG/ACT inhaler Inhale 2 puffs into the lungs every 4 (four) hours as needed.      Historical Provider, MD     Allergies:  Allergies  Allergen Reactions  . Aspirin     GI symptoms  . Bee Venom   . Morphine And Related   . Sulfa Antibiotics     dyspnea  . Tetracyclines & Related   . Codeine Rash  . Pamelor Rash  . Penicillins Rash    Past Medical History: Past Medical History  Diagnosis Date  . MI, acute, non ST segment elevation 1997    Questionable lesion in the circumflex at catheterization; normal EF; chronic dyspnea without specific cause identified  . Hyperlipidemia   . Hypertension   . Postmenopausal     Estrogen replacement therapy  . Cough     Prolonged, possible asthmatic bronchitis  . Benign essential tremor   . Osteopenia     Versus osteoporosis  . Macular degeneration   . Degenerative joint disease     versus rheumatoid arthritis  . Repeated falls     Past Surgical History  Procedure Date  . Bladder suspension   . Inguinal hernia repair     Left  . Foot surgery     Bilateral    Social History:  reports that she has never smoked. She has never used smokeless tobacco. She reports that she does not drink alcohol or use illicit drugs.  Family History:  Family History  Problem Relation Age of Onset  . Coronary artery disease    . Stroke      Review of Systems - History obtained from unobtainable from  patient due to cognitive impairment  Physical Examination Blood pressure 138/44, pulse 66, temperature 98.4 F (36.9 C), temperature source Oral, resp. rate 18, height 4\' 11"  (1.499 m), weight 54.885 kg (121 lb), SpO2 97.00%.  General appearance: alert, cooperative, appears stated age, distracted and no distress Head: Normocephalic, without obvious abnormality, atraumatic Eyes: conjunctivae/corneas clear. PERRL, EOM's intact. Throat: lips, mucosa, and tongue normal; teeth and gums normal Neck: no adenopathy, no carotid bruit, no JVD, supple, symmetrical, trachea midline and thyroid  not enlarged, symmetric, no tenderness/mass/nodules Resp: clear to auscultation bilaterally Cardio: regular rate and rhythm, S1, S2 normal, no murmur, click, rub or gallop GI: Abdomen was soft. Minimal tenderness in the epigastric area. Not much tenderness in the right upper quadrant. No rebound, rigidity, or guarding. Bowel sounds were present. No masses, organomegaly Extremities: extremities normal, atraumatic, no cyanosis or edema Pulses: 2+ and symmetric Skin: Skin color, texture, turgor normal. No rashes or lesions Lymph nodes: Cervical, supraclavicular, and axillary nodes normal. Neurologic: She is alert. Somewhat confused. No focal neurological deficits are present  Laboratory Data: Results for orders placed during the hospital encounter of 04/17/12 (from the past 48 hour(s))  CBC WITH DIFFERENTIAL     Status: Abnormal   Collection Time   04/17/12  9:52 PM      Component Value Range Comment   WBC 5.1  4.0 - 10.5 K/uL    RBC 3.65 (*) 3.87 - 5.11 MIL/uL    Hemoglobin 12.5  12.0 - 15.0 g/dL    HCT 81.1  91.4 - 78.2 %    MCV 99.2  78.0 - 100.0 fL    MCH 34.2 (*) 26.0 - 34.0 pg    MCHC 34.5  30.0 - 36.0 g/dL    RDW 95.6  21.3 - 08.6 %    Platelets 147 (*) 150 - 400 K/uL    Neutrophils Relative 79 (*) 43 - 77 %    Neutro Abs 4.0  1.7 - 7.7 K/uL    Lymphocytes Relative 10 (*) 12 - 46 %    Lymphs Abs  0.5 (*) 0.7 - 4.0 K/uL    Monocytes Relative 11  3 - 12 %    Monocytes Absolute 0.5  0.1 - 1.0 K/uL    Eosinophils Relative 0  0 - 5 %    Eosinophils Absolute 0.0  0.0 - 0.7 K/uL    Basophils Relative 0  0 - 1 %    Basophils Absolute 0.0  0.0 - 0.1 K/uL   COMPREHENSIVE METABOLIC PANEL     Status: Abnormal   Collection Time   04/17/12  9:52 PM      Component Value Range Comment   Sodium 136  135 - 145 mEq/L    Potassium 3.4 (*) 3.5 - 5.1 mEq/L    Chloride 98  96 - 112 mEq/L    CO2 27  19 - 32 mEq/L    Glucose, Bld 141 (*) 70 - 99 mg/dL    BUN 20  6 - 23 mg/dL    Creatinine, Ser 5.78 (*) 0.50 - 1.10 mg/dL    Calcium 9.3  8.4 - 46.9 mg/dL    Total Protein 6.7  6.0 - 8.3 g/dL    Albumin 3.1 (*) 3.5 - 5.2 g/dL    AST 629 (*) 0 - 37 U/L    ALT 292 (*) 0 - 35 U/L    Alkaline Phosphatase 352 (*) 39 - 117 U/L    Total Bilirubin 3.0 (*) 0.3 - 1.2 mg/dL    GFR calc non Af Amer >90  >90 mL/min    GFR calc Af Amer >90  >90 mL/min   LIPASE, BLOOD     Status: Normal   Collection Time   04/17/12  9:52 PM      Component Value Range Comment   Lipase 25  11 - 59 U/L   URINALYSIS, ROUTINE W REFLEX MICROSCOPIC     Status: Abnormal   Collection Time   04/17/12 11:04 PM  Component Value Range Comment   Color, Urine ORANGE (*) YELLOW BIOCHEMICALS MAY BE AFFECTED BY COLOR   APPearance HAZY (*) CLEAR    Specific Gravity, Urine 1.025  1.005 - 1.030    pH 6.0  5.0 - 8.0    Glucose, UA NEGATIVE  NEGATIVE mg/dL    Hgb urine dipstick TRACE (*) NEGATIVE    Bilirubin Urine MODERATE (*) NEGATIVE    Ketones, ur 40 (*) NEGATIVE mg/dL    Protein, ur 30 (*) NEGATIVE mg/dL    Urobilinogen, UA 4.0 (*) 0.0 - 1.0 mg/dL    Nitrite POSITIVE (*) NEGATIVE    Leukocytes, UA NEGATIVE  NEGATIVE   URINE MICROSCOPIC-ADD ON     Status: Abnormal   Collection Time   04/17/12 11:04 PM      Component Value Range Comment   Squamous Epithelial / LPF FEW (*) RARE    WBC, UA 3-6  <3 WBC/hpf    RBC / HPF 0-2  <3  RBC/hpf    Bacteria, UA MANY (*) RARE   PROTIME-INR     Status: Normal   Collection Time   04/17/12 11:16 PM      Component Value Range Comment   Prothrombin Time 13.0  11.6 - 15.2 seconds    INR 0.99  0.00 - 1.49   TROPONIN I     Status: Normal   Collection Time   04/18/12 12:51 AM      Component Value Range Comment   Troponin I <0.30  <0.30 ng/mL     Radiology Reports: Dg Abd Acute W/chest  04/17/2012  *RADIOLOGY REPORT*  Clinical Data: Vomiting for 5 days.  Hysterectomy.  ACUTE ABDOMEN SERIES (ABDOMEN 2 VIEW & CHEST 1 VIEW)  Comparison: 08/05/2009  Findings: Frontal view of the chest demonstrates midline trachea. Moderate cardiomegaly.  No right and no definite left pleural fluid.  Ill definition of the left hemidiaphragm, similar to on the prior. No pneumothorax.  No congestive failure.  Bibasilar volume loss.  Abominal films demonstrate no free intraperitoneal air on upright positioning.  Degradation superiorly.  Convex left lumbar spine curvature is moderate.  Aortic atherosclerosis.  No significant bowel dilatation on supine imaging. Distal gas and stool.  IMPRESSION:  1.  No free intraperitoneal air or bowel obstruction.  The upright view is degraded by artifact. 2.  Cardiomegaly with grossly similar ill definition of the left hemidiaphragm.  Possibly secondary to hiatal hernia and adjacent volume loss. When correlated to the lateral view of 01/13/2009 chest, likely due to eventration of left hemidiaphragm.   Original Report Authenticated By: Consuello Bossier, M.D.     Electrocardiogram: Pending  Assessment/Plan  Principal Problem:  *Nausea and vomiting in adult Active Problems:  HYPERLIPIDEMIA  BILIARY TRACT DISORDER  Abdominal pain  Transaminitis  UTI (lower urinary tract infection)  CAD (coronary artery disease)   #1 abdominal pain with nausea and vomiting: She was tender mainly in the epigastric area. UTI could have caused her abdominal pain, but she also has  transaminitis. She has a known history of biliary tract disease with cholelithiasis and gallbladder sludge. This could be accounting for her symptoms.  #2 transaminitis with hyperbilirubinemia: This is a cholestatic picture. She could have a stone in the CBD. A CT scan is pending at this time. She will likely require an ultrasound. We will get gastroenterology to see her. Keep her n.p.o. for now. Hepatitis panel be checked.  #3 urinary tract infection: Urine cultures will be followed up on. Levaquin  will be prescribed.  #4 history of coronary artery disease: Because she's complaining of chest heaviness once in, a while we'll check a troponin. EKG will be checked as well. Based on Dr. Marvel Plan note from July, her heart disease is stable.  #5 CODE STATUS was discussed: She is a full code for now till further discussions can take place with other family members.  DVT, prophylaxis with SCDs.  Further management decisions will depend on results of further testing and patient's response to treatment.     [facility]  [facility] Pager [phone]  04/18/2012, 1:32 AM

## 2012-04-18 NOTE — Progress Notes (Signed)
ANTIBIOTIC CONSULT NOTE - INITIAL  Pharmacy Consult for Levofloxacin Indication: UTI  Allergies  Allergen Reactions  . Aspirin     GI symptoms  . Bee Venom   . Morphine And Related   . Sulfa Antibiotics     dyspnea  . Tetracyclines & Related   . Codeine Rash  . Pamelor Rash  . Penicillins Rash    Patient Measurements: Height: 4\' 11"  (149.9 cm) Weight: 119 lb 14.9 oz (54.4 kg) IBW/kg (Calculated) : 43.2    Vital Signs: Temp: 98.3 F (36.8 C) (10/16 0220) Temp src: Oral (10/16 0220) BP: 127/75 mmHg (10/16 0220) Pulse Rate: 66  (10/16 0220) Intake/Output from previous day:   Intake/Output from this shift:    Labs:  [facility] 04/17/12 2152  WBC 5.1  HGB 12.5  PLT 147*  LABCREA --  CREATININE 0.34*   Estimated Creatinine Clearance: 36.6 ml/min (by C-G formula based on Cr of 0.34).    Microbiology: No results found for this or any previous visit (from the past 720 hour(s)).  Medical History: Past Medical History  Diagnosis Date  . MI, acute, non ST segment elevation 1997    Questionable lesion in the circumflex at catheterization; normal EF; chronic dyspnea without specific cause identified  . Hyperlipidemia   . Hypertension   . Postmenopausal     Estrogen replacement therapy  . Cough     Prolonged, possible asthmatic bronchitis  . Benign essential tremor   . Osteopenia     Versus osteoporosis  . Macular degeneration   . Degenerative joint disease     versus rheumatoid arthritis  . Repeated falls     Medications:  Scheduled:    . sodium chloride   Intravenous Once  . citalopram  30 mg Oral Daily  . fluticasone  2 puff Inhalation BID  . levofloxacin (LEVAQUIN) IV  250 mg Intravenous QHS  . ondansetron  4 mg Intravenous Once  . pantoprazole (PROTONIX) IV  40 mg Intravenous Once  . pantoprazole (PROTONIX) IV  40 mg Intravenous QHS  . simvastatin  20 mg Oral q1800  . sodium chloride  3 mL Intravenous Q12H   Infusions:    . dextrose 5 % and  0.45% NaCl     Assessment: Patient with many bacteria and few WBC's in urine, abd pain, started on first dose oral Cipro yesterday for UTI sx's.  Now for work-up of abd pain, including tx for UTI.  Goal of Therapy:  Desire low dose, but aggressive tx for UTI in elderly female with CrCl approx 30-1ml/min  Plan:  1. Will start levofloxacin 250mg  IV q24h x 3 doses 2. Follow-up with urine culture results 3. Plan to switch to oral agent if clinically necessary after first three IV doses  Armando Bukhari, Lloyd Huger E 04/18/2012,2:40 AM

## 2012-04-18 NOTE — Anesthesia Procedure Notes (Signed)
Procedure Name: Intubation Date/Time: 04/18/2012 2:58 PM Performed by: Despina Hidden Pre-anesthesia Checklist: Emergency Drugs available, Suction available, Patient identified and Patient being monitored Patient Re-evaluated:Patient Re-evaluated prior to inductionOxygen Delivery Method: Circle system utilized Preoxygenation: Pre-oxygenation with 100% oxygen Intubation Type: IV induction, Cricoid Pressure applied and Rapid sequence Ventilation: Mask ventilation without difficulty Laryngoscope Size: 3 and Mac Grade View: Grade I Tube type: Oral Tube size: 7.0 mm Number of attempts: 1 Airway Equipment and Method: Stylet Placement Confirmation: ETT inserted through vocal cords under direct vision,  positive ETCO2 and breath sounds checked- equal and bilateral Secured at: 21 cm Tube secured with: Tape Dental Injury: Teeth and Oropharynx as per pre-operative assessment

## 2012-04-18 NOTE — Clinical Social Work Psychosocial (Signed)
Clinical Social Work Department BRIEF PSYCHOSOCIAL ASSESSMENT 04/18/2012  Patient:  Candice Meza, Candice Meza     Account Number:  [id number]     Admit date:  04/17/2012  Clinical Social Worker:  Nancie Neas  Date/Time:  04/18/2012 10:00 AM  Referred by:  CSW  Date Referred:  04/18/2012 Referred for  SNF Placement   Other Referral:   Interview type:  Patient Other interview type:    PSYCHOSOCIAL DATA Living Status:  FACILITY Admitted from facility:  [facility] Level of care:  Skilled Nursing Facility Primary support name:  Peyton Najjar Primary support relationship to patient:  CHILD, ADULT Degree of support available:   lives out of town; but checks on pt as much as he can    CURRENT CONCERNS Current Concerns  Post-Acute Placement   Other Concerns:    SOCIAL WORK ASSESSMENT / PLAN CSW met with pt at bedside. Pt alert and oriented and reports she has been a resident at [facility] for almost three years. She discussed her family and said all three of her children live out of town, 2 out of state. Peyton Najjar, her oldest son, lives near [address] and has cancer. He was able to visit pt more prior to treatment. Pt said she has a close relationship with all of her children and speaks to them frequently but she misses them a lot. Per Marylene Land at [facility], pt is long term and okay to return at d/c. She uses a wheelchair at baseline.   Assessment/plan status:  Psychosocial Support/Ongoing Assessment of Needs Other assessment/ plan:   Information/referral to community resources:   [facility]    PATIENT'S/FAMILY'S RESPONSE TO PLAN OF CARE: Pt reports [facility] is her home and she is anxious to get back there as soon as possible. Awaiting stability for d/c. CSW to continue to follow.        Derenda Fennel, [place] [phone]

## 2012-04-18 NOTE — Progress Notes (Signed)
INITIAL ADULT NUTRITION ASSESSMENT Date: 04/18/2012   Time: 11:39 AM Reason for Assessment: Malnutrition Screen  ASSESSMENT: Female 78 y.o.  Dx: Nausea and vomiting in adult   Past Medical History  Diagnosis Date  . MI, acute, non ST segment elevation 1997    Questionable lesion in the circumflex at catheterization; normal EF; chronic dyspnea without specific cause identified  . Hyperlipidemia   . Hypertension   . Postmenopausal     Estrogen replacement therapy  . Cough     Prolonged, possible asthmatic bronchitis  . Benign essential tremor   . Osteopenia     Versus osteoporosis  . Macular degeneration   . Degenerative joint disease     versus rheumatoid arthritis  . Repeated falls     Scheduled Meds:   . sodium chloride   Intravenous Once  . citalopram  30 mg Oral Daily  . fluticasone  2 puff Inhalation BID  . levofloxacin (LEVAQUIN) IV  250 mg Intravenous QHS  . ondansetron  4 mg Intravenous Once  . pantoprazole (PROTONIX) IV  40 mg Intravenous Once  . pantoprazole (PROTONIX) IV  40 mg Intravenous QHS  . simvastatin  20 mg Oral q1800  . sodium chloride  3 mL Intravenous Q12H   Continuous Infusions:   . dextrose 5 % and 0.45% NaCl 50 mL/hr at 04/18/12 0250   PRN Meds:.albuterol, ALPRAZolam, HYDROmorphone (DILAUDID) injection, iohexol, ondansetron (ZOFRAN) IV, ondansetron  Ht: 4\' 11"  (149.9 cm)  Wt: 119 lb 14.9 oz (54.4 kg)  Ideal Wt: 43.2 kg  % Ideal Wt: 126%  Usual Wt:  Wt Readings from Last 10 Encounters:  04/18/12 119 lb 14.9 oz (54.4 kg)  01/24/12 121 lb 6.4 oz (55.067 kg)  06/24/11 123 lb (55.792 kg)  03/24/11 124 lb 12.8 oz (56.609 kg)  05/07/10 119 lb (53.978 kg)  05/07/09 119 lb (53.978 kg)     Body mass index is 24.22 kg/(m^2). Normal Range  Food/Nutrition Related Hx: Pt up in chair. She is from [facility]. She reports [id number] current wt reflects significant wt loss >2% x 7d likely r/t nausea, vomiting (5d), abdominal pain PTA. She  c/o joint pain and says it's increasingly difficult to feed and dress herself. Chewing difficulty, dentition is poor. NPO status at this time. Inadequate oral intake.   Pt meets criteria for severe malnutrition in the context of acute illness as evidenced by <50% for >/= 5 days, >2% wt loss in 1 week.     CMP     Component Value Date/Time   NA 135 04/18/2012 0442   K 3.3* 04/18/2012 0442   CL 98 04/18/2012 0442   CO2 29 04/18/2012 0442   GLUCOSE 121* 04/18/2012 0442   BUN 15 04/18/2012 0442   CREATININE 0.30* 04/18/2012 0442   CALCIUM 8.9 04/18/2012 0442   PROT 6.5 04/18/2012 0442   ALBUMIN 2.8* 04/18/2012 0442   AST 342* 04/18/2012 0442   ALT 306* 04/18/2012 0442   ALKPHOS 364* 04/18/2012 0442   BILITOT 3.4* 04/18/2012 0442   GFRNONAA >90 04/18/2012 0442   GFRAA >90 04/18/2012 0442   I/O last 3 completed shifts: In: 125.8 [I.V.:125.8] Out: -   I Diet Order: NPO  Supplements/Tube Feeding:none at this time  IVF:    dextrose 5 % and 0.45% NaCl Last Rate: 50 mL/hr at 04/18/12 0250   *IVF providing ~200 kcal/day at current rate  Estimated Nutritional Needs:   Kcal:1500-1782 kcal Protein:60-70 gr Fluid:1 ml/kcal  NUTRITION DIAGNOSIS: -Inadequate oral intake (  NI-2.1).  Status: Ongoing  RELATED TO: altered GI function, abnormal nutrition related labs (liver enzymes), infection  AS EVIDENCE BY: Nausea, vomiting and abdominal pain, unplanned wt loss >2% x 7d prior to admission, gallstone & gall bladder dz, UTI  MONITORING/EVALUATION(Goals): Monitor labs, diet advancement and tolerance Goal: Pt to meet >/= 90% of their estimated nutrition needs; not met  EDUCATION NEEDS: -No education needs identified at this time  INTERVENTION: When diet is advanced: -Rec Resource Breeze BID between meals -Mechanical Soft foods due to chewing difficulty  Dietitian [phone]  DOCUMENTATION CODES Per approved criteria  -Severe malnutrition in the context of acute illness or  injury    Francene Boyers 04/18/2012, 11:39 AM

## 2012-04-18 NOTE — Progress Notes (Signed)
UR Chart Review Completed  

## 2012-04-18 NOTE — Op Note (Signed)
Candice Meza, PANT             ACCOUNT NO.:  [id number]  MEDICAL RECORD NO.:  [id number]  LOCATION:  A316                          FACILITY:  [facility]  PHYSICIAN:  Lionel December, M.D.    DATE OF BIRTH:  [birth year]  DATE OF PROCEDURE:  04/18/2012 DATE OF DISCHARGE:  04/18/2012                               PROCEDURE NOTE   PROCEDURE:  ERCP.  INDICATION:  [name] is an 78 year old Caucasian female who presents with signs and symptoms of cholangitis.  She has air in gallbladder and biliary tree along with cholelithiasis, dilated bile duct, and possibly 2 stones in it.  She is undergoing therapeutic ERCP. Procedure risks were reviewed with the patient, but I also talked with her son, [name] who was the power of attorney and he agreed.  MEDICATIONS FOR SEDATION/ANESTHESIA:  Please see anesthesia records for details.  FINDINGS:  Procedure performed in the OR.  After induction of endotracheal general anesthesia, the patient was placed in semiprone position.  Therapeutic Pentax video duodenal scope was passed through oropharynx without any difficulty.  Passes Zenker's diverticulum into distal esophagus and across the pylorus into bulb and postbulbar duodenum.  Multiple diverticula were noted.  Ampulla was prominent with pieces of stone at the orifice.  I was unable to get a good approach to this papilla with the duodenal scope.  This scope was therefore exchanged with the Q scope.  I had much better approach to this papilla. Orientation was difficult because of multiple diverticula and duodenal deformity.  Cannulation was attempted with Rx 44 Autotome and 035 hydra Jagwire.  Pancreatic duct was cannulated and gently filled with contrast to outline the duct.  No abnormality was noted.  CBD was not selectively cannulated despite approaching papilla in different angles.  During this process, some small stone fragments came out along with intermittent squirt of mucopurulent  material.  I was not able to cannulate the bile duct successfully.  It was also not filled with contrast.  Endoscope was withdrawn.  The patient was extubated and taken to PACU.  She tolerated the procedure well.  FINAL DIAGNOSIS:  Unsuccessful ERCP secondary to multiple duodenal diverticula resulting in a duodenal deformity and difficult approach to ampulla.  Pancreatogram was normal though. Intermittent flow of mucopurulent bile noted consistent with diagnosis of acute cholangitis.  The patient also has a Zenker's diverticulum and moderate to large hiatal hernia.  RECOMMENDATIONS:  We will continue with IV antibiotics.  I have talked with the patient's son [name], and the patient will be transferred to [facility] for repeat ERCP, and if it is not successful, she will need percutaneous approach.          ______________________________ Lionel December, M.D.     NR/MEDQ  D:  04/18/2012  T:  04/18/2012  Job:  [id number]

## 2012-04-18 NOTE — Anesthesia Preprocedure Evaluation (Addendum)
Anesthesia Evaluation  Patient identified by MRN, date of birth, ID band Patient awake    Reviewed: Allergy & Precautions, H&P , NPO status , Patient's Chart, lab work & pertinent test results, reviewed documented beta blocker date and time   History of Anesthesia Complications Negative for: history of anesthetic complications  Airway Mallampati: II TM Distance: >3 FB Neck ROM: Full    Dental  (+) Dental Advisory Given, Loose, Poor Dentition and Missing,    Pulmonary asthma , pneumonia -, resolved,    + decreased breath sounds      Cardiovascular Exercise Tolerance: Poor hypertension, + angina at rest + Past MI Rhythm:Regular Rate:Normal     Neuro/Psych Depression    GI/Hepatic GERD-  Medicated and Poorly Controlled,  Endo/Other    Renal/GU      Musculoskeletal  (+) Arthritis -, Rheumatoid disorders,    Abdominal Normal abdominal exam  (+)   Peds  Hematology   Anesthesia Other Findings   Reproductive/Obstetrics                         Anesthesia Physical Anesthesia Plan  ASA: III  Anesthesia Plan: General   Post-op Pain Management:    Induction: Intravenous, Rapid sequence and Cricoid pressure planned  Airway Management Planned: Oral ETT  Additional Equipment:   Intra-op Plan:   Post-operative Plan: Extubation in OR  Informed Consent:   Plan Discussed with: CRNA and Anesthesiologist  Anesthesia Plan Comments:         Anesthesia Quick Evaluation

## 2012-04-18 NOTE — Brief Op Note (Signed)
04/17/2012 - 04/18/2012  4:41 PM  PATIENT:  Candice Meza  78 y.o. female  PRE-OPERATIVE DIAGNOSIS:  Acute Cholangitis  POST-OPERATIVE DIAGNOSIS:  * No post-op diagnosis entered *  PROCEDURE:  Procedure(s) (LRB) with comments: ENDOSCOPIC RETROGRADE CHOLANGIOPANCREATOGRAPHY (ERCP) (N/A) - Diagnostic  SURGEON:  Surgeon(s) and Role:    * Malissa Hippo, MD - Primary  Brief note. Zenker's diverticulum and moderate to large hiatal hernia. Multiple duodenal diverticula. Prominent ampulla water with abnormal location. It could not be approached of the duodenum scope. Therefore Q-scope used for better orientation. Pancreatic duct cannulated and filled with dilute contrast. CBD could not be cannulated. Few small fragments and mucopurulent material and submitted the floor to into duodenum. Patient tolerated the procedure well

## 2012-04-18 NOTE — Consult Note (Signed)
Reason for Consult: Elevated transaminases Referring Physician: Hospitalist  SOUMYA Meza is an 78 y.o. female.  HPI: Admitted thru the ED yesterday. She tells me she began to vomit Friday. She says she vomited all day Friday and Saturday. Some vomiting on Sunday. She tells me she had a fever of 99.3 on Sunday. She tells me she was aching all over especially in her back. She also tells me she burns when she urinates.  She denies any abdominal pain.  She resides at the [facility]. Noted to have elevated transaminases.  Please see CT below. [place] is pending.  Past Medical History  Diagnosis Date  . MI, acute, non ST segment elevation 1997    Questionable lesion in the circumflex at catheterization; normal EF; chronic dyspnea without specific cause identified  . Hyperlipidemia   . Hypertension   . Postmenopausal     Estrogen replacement therapy  . Cough     Prolonged, possible asthmatic bronchitis  . Benign essential tremor   . Osteopenia     Versus osteoporosis  . Macular degeneration   . Degenerative joint disease     versus rheumatoid arthritis  . Repeated falls     Past Surgical History  Procedure Date  . Bladder suspension   . Inguinal hernia repair     Left  . Foot surgery     Bilateral    Family History  Problem Relation Age of Onset  . Coronary artery disease    . Stroke      Social History:  reports that she has never smoked. She has never used smokeless tobacco. She reports that she does not drink alcohol or use illicit drugs.  Allergies:  Allergies  Allergen Reactions  . Aspirin     GI symptoms  . Bee Venom   . Morphine And Related   . Sulfa Antibiotics     dyspnea  . Tetracyclines & Related   . Codeine Rash  . Pamelor Rash  . Penicillins Rash    Medications: I have reviewed the patient's current medications.  Results for orders placed during the hospital encounter of 04/17/12 (from the past 48 hour(s))  CBC WITH DIFFERENTIAL     Status:  Abnormal   Collection Time   04/17/12  9:52 PM      Component Value Range Comment   WBC 5.1  4.0 - 10.5 K/uL    RBC 3.65 (*) 3.87 - 5.11 MIL/uL    Hemoglobin 12.5  12.0 - 15.0 g/dL    HCT 16.1  09.6 - 04.5 %    MCV 99.2  78.0 - 100.0 fL    MCH 34.2 (*) 26.0 - 34.0 pg    MCHC 34.5  30.0 - 36.0 g/dL    RDW 40.9  81.1 - 91.4 %    Platelets 147 (*) 150 - 400 K/uL    Neutrophils Relative 79 (*) 43 - 77 %    Neutro Abs 4.0  1.7 - 7.7 K/uL    Lymphocytes Relative 10 (*) 12 - 46 %    Lymphs Abs 0.5 (*) 0.7 - 4.0 K/uL    Monocytes Relative 11  3 - 12 %    Monocytes Absolute 0.5  0.1 - 1.0 K/uL    Eosinophils Relative 0  0 - 5 %    Eosinophils Absolute 0.0  0.0 - 0.7 K/uL    Basophils Relative 0  0 - 1 %    Basophils Absolute 0.0  0.0 - 0.1 K/uL  COMPREHENSIVE METABOLIC PANEL     Status: Abnormal   Collection Time   04/17/12  9:52 PM      Component Value Range Comment   Sodium 136  135 - 145 mEq/L    Potassium 3.4 (*) 3.5 - 5.1 mEq/L    Chloride 98  96 - 112 mEq/L    CO2 27  19 - 32 mEq/L    Glucose, Bld 141 (*) 70 - 99 mg/dL    BUN 20  6 - 23 mg/dL    Creatinine, Ser 1.61 (*) 0.50 - 1.10 mg/dL    Calcium 9.3  8.4 - 09.6 mg/dL    Total Protein 6.7  6.0 - 8.3 g/dL    Albumin 3.1 (*) 3.5 - 5.2 g/dL    AST 045 (*) 0 - 37 U/L    ALT 292 (*) 0 - 35 U/L    Alkaline Phosphatase 352 (*) 39 - 117 U/L    Total Bilirubin 3.0 (*) 0.3 - 1.2 mg/dL    GFR calc non Af Amer >90  >90 mL/min    GFR calc Af Amer >90  >90 mL/min   LIPASE, BLOOD     Status: Normal   Collection Time   04/17/12  9:52 PM      Component Value Range Comment   Lipase 25  11 - 59 U/L   URINALYSIS, ROUTINE W REFLEX MICROSCOPIC     Status: Abnormal   Collection Time   04/17/12 11:04 PM      Component Value Range Comment   Color, Urine ORANGE (*) YELLOW BIOCHEMICALS MAY BE AFFECTED BY COLOR   APPearance HAZY (*) CLEAR    Specific Gravity, Urine 1.025  1.005 - 1.030    pH 6.0  5.0 - 8.0    Glucose, UA NEGATIVE   NEGATIVE mg/dL    Hgb urine dipstick TRACE (*) NEGATIVE    Bilirubin Urine MODERATE (*) NEGATIVE    Ketones, ur 40 (*) NEGATIVE mg/dL    Protein, ur 30 (*) NEGATIVE mg/dL    Urobilinogen, UA 4.0 (*) 0.0 - 1.0 mg/dL    Nitrite POSITIVE (*) NEGATIVE    Leukocytes, UA NEGATIVE  NEGATIVE   URINE MICROSCOPIC-ADD ON     Status: Abnormal   Collection Time   04/17/12 11:04 PM      Component Value Range Comment   Squamous Epithelial / LPF FEW (*) RARE    WBC, UA 3-6  <3 WBC/hpf    RBC / HPF 0-2  <3 RBC/hpf    Bacteria, UA MANY (*) RARE   PROTIME-INR     Status: Normal   Collection Time   04/17/12 11:16 PM      Component Value Range Comment   Prothrombin Time 13.0  11.6 - 15.2 seconds    INR 0.99  0.00 - 1.49   TROPONIN I     Status: Normal   Collection Time   04/18/12 12:51 AM      Component Value Range Comment   Troponin I <0.30  <0.30 ng/mL   COMPREHENSIVE METABOLIC PANEL     Status: Abnormal   Collection Time   04/18/12  4:42 AM      Component Value Range Comment   Sodium 135  135 - 145 mEq/L    Potassium 3.3 (*) 3.5 - 5.1 mEq/L    Chloride 98  96 - 112 mEq/L    CO2 29  19 - 32 mEq/L    Glucose, Bld 121 (*) 70 - 99  mg/dL    BUN 15  6 - 23 mg/dL    Creatinine, Ser 5.40 (*) 0.50 - 1.10 mg/dL    Calcium 8.9  8.4 - 98.1 mg/dL    Total Protein 6.5  6.0 - 8.3 g/dL    Albumin 2.8 (*) 3.5 - 5.2 g/dL    AST 191 (*) 0 - 37 U/L    ALT 306 (*) 0 - 35 U/L    Alkaline Phosphatase 364 (*) 39 - 117 U/L    Total Bilirubin 3.4 (*) 0.3 - 1.2 mg/dL    GFR calc non Af Amer >90  >90 mL/min    GFR calc Af Amer >90  >90 mL/min   CBC     Status: Abnormal   Collection Time   04/18/12  4:42 AM      Component Value Range Comment   WBC 5.9  4.0 - 10.5 K/uL    RBC 3.69 (*) 3.87 - 5.11 MIL/uL    Hemoglobin 12.4  12.0 - 15.0 g/dL    HCT 47.8  29.5 - 62.1 %    MCV 99.7  78.0 - 100.0 fL    MCH 33.6  26.0 - 34.0 pg    MCHC 33.7  30.0 - 36.0 g/dL    RDW 30.8  65.7 - 84.6 %    Platelets 154  150 -  400 K/uL   TROPONIN I     Status: Normal   Collection Time   04/18/12  5:00 AM      Component Value Range Comment   Troponin I <0.30  <0.30 ng/mL     Ct Abdomen Pelvis W Contrast  04/18/2012  *RADIOLOGY REPORT*  Clinical Data: Vomiting for 5 days.  History hypertension and inguinal hernia repair.  CT ABDOMEN AND PELVIS WITH CONTRAST  Technique:  Multidetector CT imaging of the abdomen and pelvis was performed following the standard protocol during bolus administration of intravenous contrast.  Contrast: OMNIPAQUE IOHEXOL 300 MG/ML  SOLN  Comparison: Plain films earlier today.  No prior CT.  Findings: Minimal nodularity at the right lung base which is nonspecific but favored to be post infectious or inflammatory.  The left hemidiaphragm is elevated and the far superior aspect of the left upper quadrant is incompletely imaged.  Mild cardiomegaly with trace right-sided pleural fluid.  No focal liver lesions.  Normal spleen.  The stomach is incompletely imaged and positioned within the area of the left hemidiaphragm eventration.  The duodenum is positioned immediately adjacent the gallbladder, within the right upper quadrant and is mildly tortuous.  Pancreas is normal for age, without pancreatic ductal dilatation. The gallbladder is interposed anterior to the liver.  There is air within the gallbladder.  No definite wall thickening and no calcified stones.  Fluid is seen adjacent the gallbladder, separate from the stomach.  Example image 22. There could be at least one stone in the gallbladder neck.  Example image 14.  There is pneumobilia and moderate to intrahepatic biliary ductal dilatation. Right upper quadrant anatomy is distorted, with the common duct felt to insert high in the right upper quadrant. Example coronal image 18.  Common duct is normal for age, 9 mm on coronal image 23.  Normal adrenal glands.  Left renal cysts.  Normal right kidney.  No retroperitoneal or retrocrural adenopathy.   Pelvic floor laxity.  Extensive colonic diverticulosis. Normal terminal ileum and appendix.  Small bowel loops are positioned at the entrance to the right inguinal canal, but no hernia is  seen. No ascites.  No pelvic adenopathy.  Hysterectomy.  Normal urinary bladder, without adnexal mass or significant free pelvic fluid. Moderate osteopenia anterolisthesis of 1.0 cm L5 on S1.  Moderate compression deformity of T12 without significant canal compromise.  IMPRESSION: 1.  Complex anatomy, secondary to marked left hemidiaphragm eventration containing incompletely imaged portions of the stomach and colon. 2.  Gallbladder distention with pericholecystic fluid and suspicion of gallbladder neck stones.  Consider right upper quadrant ultrasound to exclude acute cholecystitis. 3.  Pneumobilia with air within the gallbladder; indeterminate etiology.  Intrahepatic biliary ductal dilatation which is moderate.  Question obstruction secondary to gallbladder inflammation (i.e.Mirizzi syndrome). 4.  Descending duodenum positioned high in the right upper quadrant, anterior to the liver.  This is felt to be secondary to anatomic distortion from the position of the majority of the stomach. 5.  Pelvic floor laxity. 6.  Osteopenia with the lumbosacral spine malalignment and a T12 compression deformity.   Original Report Authenticated By: Consuello Bossier, M.D.    Dg Abd Acute W/chest  04/17/2012  *RADIOLOGY REPORT*  Clinical Data: Vomiting for 5 days.  Hysterectomy.  ACUTE ABDOMEN SERIES (ABDOMEN 2 VIEW & CHEST 1 VIEW)  Comparison: 08/05/2009  Findings: Frontal view of the chest demonstrates midline trachea. Moderate cardiomegaly.  No right and no definite left pleural fluid.  Ill definition of the left hemidiaphragm, similar to on the prior. No pneumothorax.  No congestive failure.  Bibasilar volume loss.  Abominal films demonstrate no free intraperitoneal air on upright positioning.  Degradation superiorly.  Convex left lumbar spine  curvature is moderate.  Aortic atherosclerosis.  No significant bowel dilatation on supine imaging. Distal gas and stool.  IMPRESSION:  1.  No free intraperitoneal air or bowel obstruction.  The upright view is degraded by artifact. 2.  Cardiomegaly with grossly similar ill definition of the left hemidiaphragm.  Possibly secondary to hiatal hernia and adjacent volume loss. When correlated to the lateral view of 01/13/2009 chest, likely due to eventration of left hemidiaphragm.   Original Report Authenticated By: Consuello Bossier, M.D.     ROS Blood pressure 119/63, pulse 74, temperature 99.7 F (37.6 C), temperature source Oral, resp. rate 18, height 4\' 11"  (1.499 m), weight 119 lb 14.9 oz (54.4 kg), SpO2 95.00%. Physical ExamAlert and oriented. Skin warm and dry. Oral mucosa is moist.   . Sclera icteric, conjunctivae is pink. Thyroid not enlarged. No cervical lymphadenopathy. Lungs clear. Heart regular rate and rhythm.  Abdomen is soft. Bowel sounds are positive. No hepatomegaly. No abdominal masses felt. No tenderness.  No edema to lower extremities Assessment/Plan: Elevated transaminases. Possible stone in CBD. [place] pending.  Agree with NPO status at this time.  Further recommendations to follow.   SETZER,TERRI W 04/18/2012, 8:23 AM     GI attending note; Patient interviewed and examined earlier today. I reviewed her CT and ultrasound. Patient has acute cholangitis. She possibly has mixed infection given that she has gas the gallbladder and biliary tree. He has mild tenderness at right upper quadrant. Surprisingly she appears be quite stable. Patient is on IV Levaquin and metronidazole added for anaerobic coverage. Patient has coronary artery disease and therefore significant risk for any intervention. I have discussed patient's condition with her son Candice Meza pleasant has a power of attorney. We discussed the pros and cones of therapy. Not treating her condition with catastrophic  complications Patient and her son agreeable to proceeding with ERCP for therapeutic purposes. Will proceed with procedure this afternoon.

## 2012-04-18 NOTE — Anesthesia Postprocedure Evaluation (Addendum)
  Anesthesia Post-op Note  Patient: Candice Meza  Procedure(s) Performed: Procedure(s) (LRB) with comments: ENDOSCOPIC RETROGRADE CHOLANGIOPANCREATOGRAPHY (ERCP) (N/A) - Diagnostic  Patient Location: PACU  Anesthesia Type: General  Level of Consciousness: awake, alert , oriented and patient cooperative  Airway and Oxygen Therapy: Patient Spontanous Breathing  Post-op Pain: none  Post-op Assessment: Post-op Vital signs reviewed, Patient's Cardiovascular Status Stable, Respiratory Function Stable, Patent Airway and Pain level controlled  Post-op Vital Signs: Reviewed and stable  Complications: No apparent anesthesia complications 04/19/12  Patient transferred to [facility].

## 2012-04-18 NOTE — Transfer of Care (Signed)
Immediate Anesthesia Transfer of Care Note  Patient: Candice Meza  Procedure(s) Performed: Procedure(s) (LRB) with comments: ENDOSCOPIC RETROGRADE CHOLANGIOPANCREATOGRAPHY (ERCP) (N/A) - Diagnostic  Patient Location: PACU  Anesthesia Type: General  Level of Consciousness: awake and patient cooperative  Airway & Oxygen Therapy: Patient Spontanous Breathing and Patient connected to face mask oxygen  Post-op Assessment: Report given to PACU RN, Post -op Vital signs reviewed and stable and Patient moving all extremities  Post vital signs: Reviewed and stable  Complications: No apparent anesthesia complications

## 2012-04-18 NOTE — Progress Notes (Signed)
Pt transferred to [facility] per ground transport. P'ts POA notified and consent obtained from son. VS stable on discharge.

## 2012-04-18 NOTE — Progress Notes (Addendum)
Subjective: This lady was admitted yesterday with 2 week history of abdominal pain mostly on the right side radiating to the back. She also had nausea and vomiting. She has an abnormal CT abdomen scan suggestive of gallbladder and gallstone disease.           Physical Exam: Blood pressure 119/63, pulse 74, temperature 99.7 F (37.6 C), temperature source Oral, resp. rate 18, height 4\' 11"  (1.499 m), weight 54.4 kg (119 lb 14.9 oz), SpO2 95.00%. She currently looks systemically well. She is not toxic or septic. Abdomen is soft Nontender the present time. Bowel sounds are are not heard. Heart sounds are present and normal. Lung fields are clear. She is alert and oriented.   Investigations:  No results found for this or any previous visit (from the past 240 hour(s)).   Basic Metabolic Panel:  Basename 04/18/12 0442 04/17/12 2152  NA 135 136  K 3.3* 3.4*  CL 98 98  CO2 29 27  GLUCOSE 121* 141*  BUN 15 20  CREATININE 0.30* 0.34*  CALCIUM 8.9 9.3  MG -- --  PHOS -- --   Liver Function Tests:  [facility] 04/18/12 0442 04/17/12 2152  AST 342* 377*  ALT 306* 292*  ALKPHOS 364* 352*  BILITOT 3.4* 3.0*  PROT 6.5 6.7  ALBUMIN 2.8* 3.1*     CBC:  Basename 04/18/12 0442 04/17/12 2152  WBC 5.9 5.1  NEUTROABS -- 4.0  HGB 12.4 12.5  HCT 36.8 36.2  MCV 99.7 99.2  PLT 154 147*    Ct Abdomen Pelvis W Contrast  04/18/2012  *RADIOLOGY REPORT*  Clinical Data: Vomiting for 5 days.  History hypertension and inguinal hernia repair.  CT ABDOMEN AND PELVIS WITH CONTRAST  Technique:  Multidetector CT imaging of the abdomen and pelvis was performed following the standard protocol during bolus administration of intravenous contrast.  Contrast: OMNIPAQUE IOHEXOL 300 MG/ML  SOLN  Comparison: Plain films earlier today.  No prior CT.  Findings: Minimal nodularity at the right lung base which is nonspecific but favored to be post infectious or inflammatory.  The left  hemidiaphragm is elevated and the far superior aspect of the left upper quadrant is incompletely imaged.  Mild cardiomegaly with trace right-sided pleural fluid.  No focal liver lesions.  Normal spleen.  The stomach is incompletely imaged and positioned within the area of the left hemidiaphragm eventration.  The duodenum is positioned immediately adjacent the gallbladder, within the right upper quadrant and is mildly tortuous.  Pancreas is normal for age, without pancreatic ductal dilatation. The gallbladder is interposed anterior to the liver.  There is air within the gallbladder.  No definite wall thickening and no calcified stones.  Fluid is seen adjacent the gallbladder, separate from the stomach.  Example image 22. There could be at least one stone in the gallbladder neck.  Example image 14.  There is pneumobilia and moderate to intrahepatic biliary ductal dilatation. Right upper quadrant anatomy is distorted, with the common duct felt to insert high in the right upper quadrant. Example coronal image 18.  Common duct is normal for age, 9 mm on coronal image 23.  Normal adrenal glands.  Left renal cysts.  Normal right kidney.  No retroperitoneal or retrocrural adenopathy.  Pelvic floor laxity.  Extensive colonic diverticulosis. Normal terminal ileum and appendix.  Small bowel loops are positioned at the entrance to the right inguinal canal, but no hernia is seen. No ascites.  No pelvic adenopathy.  Hysterectomy.  Normal urinary bladder, without adnexal mass or significant free pelvic fluid. Moderate osteopenia anterolisthesis of 1.0 cm L5 on S1.  Moderate compression deformity of T12 without significant canal compromise.  IMPRESSION: 1.  Complex anatomy, secondary to marked left hemidiaphragm eventration containing incompletely imaged portions of the stomach and colon. 2.  Gallbladder distention with pericholecystic fluid and suspicion of gallbladder neck stones.  Consider right upper quadrant ultrasound to  exclude acute cholecystitis. 3.  Pneumobilia with air within the gallbladder; indeterminate etiology.  Intrahepatic biliary ductal dilatation which is moderate.  Question obstruction secondary to gallbladder inflammation (i.e.Mirizzi syndrome). 4.  Descending duodenum positioned high in the right upper quadrant, anterior to the liver.  This is felt to be secondary to anatomic distortion from the position of the majority of the stomach. 5.  Pelvic floor laxity. 6.  Osteopenia with the lumbosacral spine malalignment and a T12 compression deformity.   Original Report Authenticated By: Consuello Bossier, M.D.    Dg Abd Acute W/chest  04/17/2012  *RADIOLOGY REPORT*  Clinical Data: Vomiting for 5 days.  Hysterectomy.  ACUTE ABDOMEN SERIES (ABDOMEN 2 VIEW & CHEST 1 VIEW)  Comparison: 08/05/2009  Findings: Frontal view of the chest demonstrates midline trachea. Moderate cardiomegaly.  No right and no definite left pleural fluid.  Ill definition of the left hemidiaphragm, similar to on the prior. No pneumothorax.  No congestive failure.  Bibasilar volume loss.  Abominal films demonstrate no free intraperitoneal air on upright positioning.  Degradation superiorly.  Convex left lumbar spine curvature is moderate.  Aortic atherosclerosis.  No significant bowel dilatation on supine imaging. Distal gas and stool.  IMPRESSION:  1.  No free intraperitoneal air or bowel obstruction.  The upright view is degraded by artifact. 2.  Cardiomegaly with grossly similar ill definition of the left hemidiaphragm.  Possibly secondary to hiatal hernia and adjacent volume loss. When correlated to the lateral view of 01/13/2009 chest, likely due to eventration of left hemidiaphragm.   Original Report Authenticated By: Consuello Bossier, M.D.       Medications: I have reviewed the patient's current medications.  Impression: 1. Abdominal pain mostly on the right side. I think this is likely related to gallstone and gallbladder disease. 2.  Abnormal liver enzymes consistent with a hepatitic picture rather than an obstructive picture. 3. History of coronary artery disease, stable. 4. UTI.     Plan: 1. Await ultrasound of the abdomen. Agree with intravenous antibiotics for UTI. 2. Await further recommendations from gastroenterology. We may need to get surgery involved also.     LOS: 1 day   Wilson Singer Pager [phone]  04/18/2012, 9:56 AM

## 2012-04-18 NOTE — Progress Notes (Signed)
Patient with orders to be transferred to [facility]. Report given to [place], RN at [facility].

## 2012-04-19 LAB — HEPATITIS PANEL, ACUTE: HCV Ab: NEGATIVE

## 2012-04-19 NOTE — Addendum Note (Signed)
Addendum  created 04/19/12 0757 by Moshe Salisbury, CRNA   Modules edited:Notes Section

## 2012-04-20 DIAGNOSIS — Z888 Allergy status to other drugs, medicaments and biological substances status: Secondary | ICD-10-CM | POA: Diagnosis not present

## 2012-04-20 DIAGNOSIS — K449 Diaphragmatic hernia without obstruction or gangrene: Secondary | ICD-10-CM | POA: Diagnosis not present

## 2012-04-20 DIAGNOSIS — Z880 Allergy status to penicillin: Secondary | ICD-10-CM | POA: Diagnosis not present

## 2012-04-20 DIAGNOSIS — I252 Old myocardial infarction: Secondary | ICD-10-CM | POA: Diagnosis not present

## 2012-04-20 DIAGNOSIS — K5710 Diverticulosis of small intestine without perforation or abscess without bleeding: Secondary | ICD-10-CM | POA: Diagnosis not present

## 2012-04-20 DIAGNOSIS — N390 Urinary tract infection, site not specified: Secondary | ICD-10-CM | POA: Diagnosis not present

## 2012-04-20 DIAGNOSIS — I259 Chronic ischemic heart disease, unspecified: Secondary | ICD-10-CM | POA: Diagnosis not present

## 2012-04-20 DIAGNOSIS — R6889 Other general symptoms and signs: Secondary | ICD-10-CM | POA: Diagnosis not present

## 2012-04-20 DIAGNOSIS — E876 Hypokalemia: Secondary | ICD-10-CM | POA: Diagnosis not present

## 2012-04-20 DIAGNOSIS — I214 Non-ST elevation (NSTEMI) myocardial infarction: Secondary | ICD-10-CM | POA: Diagnosis not present

## 2012-04-20 DIAGNOSIS — K219 Gastro-esophageal reflux disease without esophagitis: Secondary | ICD-10-CM | POA: Diagnosis not present

## 2012-04-20 DIAGNOSIS — E785 Hyperlipidemia, unspecified: Secondary | ICD-10-CM | POA: Diagnosis not present

## 2012-04-20 DIAGNOSIS — J45909 Unspecified asthma, uncomplicated: Secondary | ICD-10-CM | POA: Diagnosis not present

## 2012-04-20 DIAGNOSIS — I10 Essential (primary) hypertension: Secondary | ICD-10-CM | POA: Diagnosis not present

## 2012-04-20 DIAGNOSIS — K8309 Other cholangitis: Secondary | ICD-10-CM | POA: Diagnosis not present

## 2012-04-20 DIAGNOSIS — N119 Chronic tubulo-interstitial nephritis, unspecified: Secondary | ICD-10-CM | POA: Diagnosis not present

## 2012-04-20 DIAGNOSIS — K829 Disease of gallbladder, unspecified: Secondary | ICD-10-CM | POA: Diagnosis not present

## 2012-04-20 DIAGNOSIS — R300 Dysuria: Secondary | ICD-10-CM | POA: Diagnosis not present

## 2012-04-20 DIAGNOSIS — J189 Pneumonia, unspecified organism: Secondary | ICD-10-CM | POA: Diagnosis not present

## 2012-04-20 DIAGNOSIS — Z886 Allergy status to analgesic agent status: Secondary | ICD-10-CM | POA: Diagnosis not present

## 2012-04-20 DIAGNOSIS — M069 Rheumatoid arthritis, unspecified: Secondary | ICD-10-CM | POA: Diagnosis not present

## 2012-04-20 DIAGNOSIS — K839 Disease of biliary tract, unspecified: Secondary | ICD-10-CM | POA: Diagnosis not present

## 2012-04-20 DIAGNOSIS — K8050 Calculus of bile duct without cholangitis or cholecystitis without obstruction: Secondary | ICD-10-CM | POA: Diagnosis not present

## 2012-04-20 DIAGNOSIS — M79609 Pain in unspecified limb: Secondary | ICD-10-CM | POA: Diagnosis not present

## 2012-04-20 DIAGNOSIS — K225 Diverticulum of esophagus, acquired: Secondary | ICD-10-CM | POA: Diagnosis not present

## 2012-04-20 DIAGNOSIS — K831 Obstruction of bile duct: Secondary | ICD-10-CM | POA: Diagnosis not present

## 2012-04-20 DIAGNOSIS — Z4689 Encounter for fitting and adjustment of other specified devices: Secondary | ICD-10-CM | POA: Diagnosis not present

## 2012-04-20 DIAGNOSIS — R3919 Other difficulties with micturition: Secondary | ICD-10-CM | POA: Diagnosis not present

## 2012-04-20 DIAGNOSIS — R5381 Other malaise: Secondary | ICD-10-CM | POA: Diagnosis not present

## 2012-04-20 DIAGNOSIS — B351 Tinea unguium: Secondary | ICD-10-CM | POA: Diagnosis not present

## 2012-04-20 LAB — URINE CULTURE

## 2012-04-22 DIAGNOSIS — K8309 Other cholangitis: Secondary | ICD-10-CM | POA: Diagnosis not present

## 2012-04-23 ENCOUNTER — Encounter (HOSPITAL_COMMUNITY): Payer: Self-pay | Admitting: Internal Medicine

## 2012-04-26 NOTE — Discharge Summary (Signed)
Physician Discharge Summary  Candice Meza GEX:[id number] DOB: 08/06/1921 DOA: 04/17/2012  PCP: Estanislado Pandy, MD  Admit date: 04/17/2012 Discharge date: 04/18/2012(transferred to [facility] [place]).  Time spent: Less than 30 minutes  Discharge Diagnoses:  1. Cholangitis, status post unsuccessful ERCP secondary to multiple duodenal diverticula resulting in a duodenal deformity and difficult approach the ampulla. Pancreatogram was normal. 2. Coronary artery disease, stable. 3. UTI.   Discharge Condition: Stable.  Diet recommendation: N.p.o. for the time being.  Filed Weights   04/17/12 2028 04/18/12 0220  Weight: 54.885 kg (121 lb) 54.4 kg (119 lb 14.9 oz)    History of present illness:  This 78 year old woman presents to the hospital with symptoms of abdominal pain. Please see initial history as outlined below: HPI: This is a 78 year old, Caucasian female, who lives in a nursing facility, who presented to the hospital due to complaints of abdominal pain, nausea and vomiting. She has a history of coronary artery disease. She was in her usual state of health till Friday when she started having lower abdominal discomfort. This started relocating to the upper abdomen. She had episodes of nausea, vomiting. Patient appears to have some form of cognitive, impairment. She cannot remember all the details. She repeats herself at times. So history is not completely reliable. She tells me that the pain is continuous. It is a gnawing type of pain and is 10 out of 10 in intensity. She tells me it is not related to food intake. Denies any diarrhea. She had multiple episodes of emesis. She had some chills but she also has a history of essential tremors. She had some pain with urination, and was recently diagnosed with a UTI and was started on ciprofloxacin yesterday. She tells me that she had a "gallbladder attack" about 3 years ago and had to be admitted at [facility] in [place]. She at that time  was told that she was not a surgical candidate. She also tells me that she has chest pressure once in a while. Overall she is not a very good historian probably because of her cognitive, impairment.  Hospital Course:  Patient was admitted and started empirically on intravenous antibiotics. Ultrasound of the abdomen showed sludge and singular tablets within a significantly distended gallbladder. There was markedly dilated biliary system with evidence of choledocholithiasis and common bile duct sludge. She was seen by gastroenterology, Dr. Karilyn Cota, who recommended ERCP. Unfortunately ERCP was unsuccessful. After discussing with the patient's son, the patient was then transferred to [facility] for further management.  Procedures:  ERCP on 04/18/2012  FINDINGS: Procedure performed in the OR. After induction of  endotracheal general anesthesia, the patient was placed in semiprone  position. Therapeutic Pentax video duodenal scope was passed through  oropharynx without any difficulty. Passes Zenker's diverticulum into  distal esophagus and across the pylorus into bulb and postbulbar  duodenum. Multiple diverticula were noted. Ampulla was prominent with  pieces of stone at the orifice. I was unable to get a good approach to  this papilla with the duodenal scope. This scope was therefore  exchanged with the Q scope. I had much better approach to this papilla.  Orientation was difficult because of multiple diverticula and duodenal  deformity. Cannulation was attempted with Rx 44 Autotome and 035 hydra  Jagwire. Pancreatic duct was cannulated and gently filled with contrast  to outline the duct. No abnormality was noted. CBD was not selectively  cannulated despite approaching papilla in different angles. During this  process, some small stone  fragments came out along with intermittent  squirt of mucopurulent material. I was not able to cannulate the bile  duct successfully. It was also not filled  with contrast. Endoscope was  withdrawn. The patient was extubated and taken to PACU. She tolerated  the procedure well.  FINAL DIAGNOSIS: Unsuccessful ERCP secondary to multiple duodenal  diverticula resulting in a duodenal deformity and difficult approach to  ampulla. Pancreatogram was normal though.  Intermittent flow of mucopurulent bile noted consistent with diagnosis of acute cholangitis.  The patient also has a Zenker's diverticulum and moderate to large  hiatal hernia.  RECOMMENDATIONS: We will continue with IV antibiotics.  I have talked with the patient's son [name], and the patient  will be transferred to [facility] for repeat ERCP,  and if it is not successful, she will need percutaneous approach.  Consultations:  Gastroenterology, Dr. Karilyn Cota.  Discharge Exam: Filed Vitals:   04/18/12 1745 04/18/12 1807 04/18/12 1946 04/18/12 2100  BP: 124/57 105/64  101/62  Pulse: 78 80  78  Temp:    98.1 F (36.7 C)  TempSrc:    Oral  Resp: 23 18  18   Height:      Weight:      SpO2: 93% 92% 95% 94%    General: She looks systemically well. Is not toxic or septic. Cardiovascular: Heart sounds are present and normal without murmurs. Respiratory: Lung fields clear. Abdomen was actually nontender.  Discharge Instructions     Medication List     As of 04/26/2012 11:26 AM    ASK your doctor about these medications         acetaminophen 650 MG CR tablet   Commonly known as: TYLENOL   Take 1,300 mg by mouth 2 (two) times daily as needed. For pain      albuterol 108 (90 BASE) MCG/ACT inhaler   Commonly known as: PROVENTIL HFA;VENTOLIN HFA   Inhale 2 puffs into the lungs every 4 (four) hours as needed.      albuterol (2.5 MG/3ML) 0.083% nebulizer solution   Commonly known as: PROVENTIL   Take 2.5 mg by nebulization every 4 (four) hours as needed.      ALPRAZolam 0.25 MG tablet   Commonly known as: XANAX   Take 0.25 mg by mouth every 8  (eight) hours as needed. For anxiety      aspirin 81 MG tablet   Take 81 mg by mouth daily.      beclomethasone 80 MCG/ACT inhaler   Commonly known as: QVAR   Inhale 1 puff into the lungs 2 (two) times daily.      BENEFIBER PO   Take 30 mLs by mouth 2 (two) times daily. Mix two tablespoons in 8 ounces of water or juice and drink daily      CALCIUM 500 +D 500-400 MG-UNIT Tabs   Generic drug: Calcium Carb-Cholecalciferol   Take 1 tablet by mouth daily.      cetirizine 10 MG tablet   Commonly known as: ZYRTEC   Take 10 mg by mouth daily.      citalopram 10 MG tablet   Commonly known as: CELEXA   Take 30 mg by mouth daily.      dextromethorphan 30 MG/5ML liquid   Commonly known as: DELSYM   Take 60 mg by mouth 2 (two) times daily as needed. For  cough      fluticasone 50 MCG/ACT nasal spray   Commonly known as: FLONASE   Place  2 sprays into the nose daily.      furosemide 20 MG tablet   Commonly known as: LASIX   Take 20 mg by mouth daily.      furosemide 40 MG tablet   Commonly known as: LASIX   Take 40 mg by mouth as needed.      GUAIFENESIN PO   Take 600 mg by mouth 2 (two) times daily.      multivitamin tablet   Take 1 tablet by mouth daily.      nabumetone 500 MG tablet   Commonly known as: RELAFEN   Take 500 mg by mouth 2 (two) times daily.      nitroGLYCERIN 0.4 MG SL tablet   Commonly known as: NITROSTAT   Place 0.4 mg under the tongue as directed.      omeprazole 40 MG capsule   Commonly known as: PRILOSEC   Take 40 mg by mouth daily.      polyethylene glycol powder powder   Commonly known as: GLYCOLAX/MIRALAX   Take 17 g by mouth daily.      potassium chloride 10 MEQ tablet   Commonly known as: K-DUR   Take 20 mEq by mouth daily.      pravastatin 40 MG tablet   Commonly known as: PRAVACHOL   Take 40 mg by mouth at bedtime.      promethazine 25 MG tablet   Commonly known as: PHENERGAN   Take 12.5 mg by mouth every 4 (four) hours as needed.  For nausea      SYSTANE 0.4-0.3 % Soln   Generic drug: Polyethyl Glycol-Propyl Glycol   Apply 1 drop to eye 3 (three) times daily.      traMADol 50 MG tablet   Commonly known as: ULTRAM   Take 50 mg by mouth every 6 (six) hours as needed. For pain      vitamin E 400 UNIT capsule   Take 400 Units by mouth daily.          The results of significant diagnostics from this hospitalization (including imaging, microbiology, ancillary and laboratory) are listed below for reference.    Significant Diagnostic Studies: US Abdomen Complete  04/18/2012  *RADIOLOGY REPORT*  Clinical Data:  Transaminitis, abnormal CT showing gallbladder and biliary dilatation with biliary air question cholangitis, probable choledocholithiasis  ULTRASOUND ABDOMEN:  Technique:  Sonography of upper abdominal structures was performed.  Comparison:  CT abdomen and pelvis 04/18/2012  Gallbladder:  Severely distended, containing sludge and calculi as well as gas on prior CT though this is less well visualized sonographically. Minimal gallbladder wall thickening.  No definite sonographic Murphy's sign.  Gallbladder measures 11.3 cm in length and up to 5.9 x 5.9 cm in axial dimensions.  Common bile duct:  Significantly dilated, 20 mm diameter, containing intraluminal echogenic foci consistent with choledocholithiasis.  Additional sludge within CBD.  Associated intrahepatic biliary dilatation.  Liver:  Echogenic, likely fatty infiltration, though this can be seen with cirrhosis and certain infiltrative disorders.  Few scattered echogenic foci and shadowing foci are seen likely representing the small amounts of biliary gas identified on preceding CT.  IVC:  Unremarkable  Pancreas:  Portions of pancreatic body and proximal tail demonstrate upper normal pancreatic duct diameter 3.3 mm.  Head and remainder of tail are obscured by bowel gas.  Spleen:  Normal appearance, 7.0 cm length  Right kidney:  10.3 cm length. Normal morphology  without mass or hydronephrosis.  Left kidney:  11.0 cm length. Normal  morphology without mass or hydronephrosis.  Aorta:  Ectatic abdominal aorta.  Other:  No gross upper abdominal ascites.  IMPRESSION: Sludge and single calculus within a significantly distended gallbladder. Marked biliary dilatation with evidence of choledocholithiasis and CBD sludge. Incomplete visualization of pancreas. Probable fatty infiltration of liver.   Original Report Authenticated By: Lollie Marrow, M.D.    Ct Abdomen Pelvis W Contrast  04/18/2012  *RADIOLOGY REPORT*  Clinical Data: Vomiting for 5 days.  History hypertension and inguinal hernia repair.  CT ABDOMEN AND PELVIS WITH CONTRAST  Technique:  Multidetector CT imaging of the abdomen and pelvis was performed following the standard protocol during bolus administration of intravenous contrast.  Contrast: OMNIPAQUE IOHEXOL 300 MG/ML  SOLN  Comparison: Plain films earlier today.  No prior CT.  Findings: Minimal nodularity at the right lung base which is nonspecific but favored to be post infectious or inflammatory.  The left hemidiaphragm is elevated and the far superior aspect of the left upper quadrant is incompletely imaged.  Mild cardiomegaly with trace right-sided pleural fluid.  No focal liver lesions.  Normal spleen.  The stomach is incompletely imaged and positioned within the area of the left hemidiaphragm eventration.  The duodenum is positioned immediately adjacent the gallbladder, within the right upper quadrant and is mildly tortuous.  Pancreas is normal for age, without pancreatic ductal dilatation. The gallbladder is interposed anterior to the liver.  There is air within the gallbladder.  No definite wall thickening and no calcified stones.  Fluid is seen adjacent the gallbladder, separate from the stomach.  Example image 22. There could be at least one stone in the gallbladder neck.  Example image 14.  There is pneumobilia and moderate to intrahepatic biliary  ductal dilatation. Right upper quadrant anatomy is distorted, with the common duct felt to insert high in the right upper quadrant. Example coronal image 18.  Common duct is normal for age, 9 mm on coronal image 23.  Normal adrenal glands.  Left renal cysts.  Normal right kidney.  No retroperitoneal or retrocrural adenopathy.  Pelvic floor laxity.  Extensive colonic diverticulosis. Normal terminal ileum and appendix.  Small bowel loops are positioned at the entrance to the right inguinal canal, but no hernia is seen. No ascites.  No pelvic adenopathy.  Hysterectomy.  Normal urinary bladder, without adnexal mass or significant free pelvic fluid. Moderate osteopenia anterolisthesis of 1.0 cm L5 on S1.  Moderate compression deformity of T12 without significant canal compromise.  IMPRESSION: 1.  Complex anatomy, secondary to marked left hemidiaphragm eventration containing incompletely imaged portions of the stomach and colon. 2.  Gallbladder distention with pericholecystic fluid and suspicion of gallbladder neck stones.  Consider right upper quadrant ultrasound to exclude acute cholecystitis. 3.  Pneumobilia with air within the gallbladder; indeterminate etiology.  Intrahepatic biliary ductal dilatation which is moderate.  Question obstruction secondary to gallbladder inflammation (i.e.Mirizzi syndrome). 4.  Descending duodenum positioned high in the right upper quadrant, anterior to the liver.  This is felt to be secondary to anatomic distortion from the position of the majority of the stomach. 5.  Pelvic floor laxity. 6.  Osteopenia with the lumbosacral spine malalignment and a T12 compression deformity.   Original Report Authenticated By: Consuello Bossier, M.D.    Dg Abd Acute W/chest  04/17/2012  *RADIOLOGY REPORT*  Clinical Data: Vomiting for 5 days.  Hysterectomy.  ACUTE ABDOMEN SERIES (ABDOMEN 2 VIEW & CHEST 1 VIEW)  Comparison: 08/05/2009  Findings: Frontal view of the chest  demonstrates midline trachea.  Moderate cardiomegaly.  No right and no definite left pleural fluid.  Ill definition of the left hemidiaphragm, similar to on the prior. No pneumothorax.  No congestive failure.  Bibasilar volume loss.  Abominal films demonstrate no free intraperitoneal air on upright positioning.  Degradation superiorly.  Convex left lumbar spine curvature is moderate.  Aortic atherosclerosis.  No significant bowel dilatation on supine imaging. Distal gas and stool.  IMPRESSION:  1.  No free intraperitoneal air or bowel obstruction.  The upright view is degraded by artifact. 2.  Cardiomegaly with grossly similar ill definition of the left hemidiaphragm.  Possibly secondary to hiatal hernia and adjacent volume loss. When correlated to the lateral view of 01/13/2009 chest, likely due to eventration of left hemidiaphragm.   Original Report Authenticated By: Consuello Bossier, M.D.     Microbiology: Recent Results (from the past 240 hour(s))  URINE CULTURE     Status: Normal   Collection Time   04/17/12 11:04 PM      Component Value Range Status Comment   Specimen Description URINE, CATHETERIZED   Final    Special Requests NONE   Final    Culture  Setup Time 04/18/2012 10:30   Final    Colony Count >=100,000 COLONIES/ML   Final    Culture     Final    Value: ESCHERICHIA COLI     LACTOBACILLUS SPECIES     Note: Standardized susceptibility testing for this organism is not available.   Report Status 04/20/2012 FINAL   Final    Organism ID, Bacteria ESCHERICHIA COLI   Final   MRSA PCR SCREENING     Status: Normal   Collection Time   04/18/12  3:38 AM      Component Value Range Status Comment   MRSA by PCR NEGATIVE  NEGATIVE Final              Signed:  Wilson Singer  [facility] 04/26/2012, 11:26 AM

## 2012-05-28 DIAGNOSIS — N390 Urinary tract infection, site not specified: Secondary | ICD-10-CM | POA: Diagnosis not present

## 2012-06-07 DIAGNOSIS — K225 Diverticulum of esophagus, acquired: Secondary | ICD-10-CM | POA: Diagnosis not present

## 2012-06-07 DIAGNOSIS — K831 Obstruction of bile duct: Secondary | ICD-10-CM | POA: Diagnosis not present

## 2012-06-07 DIAGNOSIS — K8309 Other cholangitis: Secondary | ICD-10-CM | POA: Diagnosis not present

## 2012-06-07 DIAGNOSIS — K5710 Diverticulosis of small intestine without perforation or abscess without bleeding: Secondary | ICD-10-CM | POA: Diagnosis not present

## 2012-06-07 DIAGNOSIS — Z4689 Encounter for fitting and adjustment of other specified devices: Secondary | ICD-10-CM | POA: Diagnosis not present

## 2012-06-07 DIAGNOSIS — K449 Diaphragmatic hernia without obstruction or gangrene: Secondary | ICD-10-CM | POA: Diagnosis not present

## 2012-06-17 DIAGNOSIS — K829 Disease of gallbladder, unspecified: Secondary | ICD-10-CM | POA: Diagnosis not present

## 2012-07-25 DIAGNOSIS — J40 Bronchitis, not specified as acute or chronic: Secondary | ICD-10-CM | POA: Diagnosis not present

## 2012-08-07 DIAGNOSIS — B351 Tinea unguium: Secondary | ICD-10-CM | POA: Diagnosis not present

## 2012-08-07 DIAGNOSIS — M79609 Pain in unspecified limb: Secondary | ICD-10-CM | POA: Diagnosis not present

## 2012-08-12 DIAGNOSIS — R002 Palpitations: Secondary | ICD-10-CM | POA: Diagnosis not present

## 2012-08-16 ENCOUNTER — Ambulatory Visit: Payer: Medicare Other | Admitting: Adult Health

## 2012-08-27 DIAGNOSIS — M7989 Other specified soft tissue disorders: Secondary | ICD-10-CM | POA: Diagnosis not present

## 2012-08-27 DIAGNOSIS — M25539 Pain in unspecified wrist: Secondary | ICD-10-CM | POA: Diagnosis not present

## 2012-08-31 ENCOUNTER — Ambulatory Visit (INDEPENDENT_AMBULATORY_CARE_PROVIDER_SITE_OTHER): Payer: Medicare Other | Admitting: Adult Health

## 2012-08-31 ENCOUNTER — Encounter: Payer: Self-pay | Admitting: Adult Health

## 2012-08-31 VITALS — BP 115/65 | HR 60 | Ht 59.0 in | Wt 119.8 lb

## 2012-08-31 DIAGNOSIS — I10 Essential (primary) hypertension: Secondary | ICD-10-CM | POA: Diagnosis not present

## 2012-08-31 DIAGNOSIS — I2510 Atherosclerotic heart disease of native coronary artery without angina pectoris: Secondary | ICD-10-CM | POA: Diagnosis not present

## 2012-08-31 DIAGNOSIS — E785 Hyperlipidemia, unspecified: Secondary | ICD-10-CM | POA: Diagnosis not present

## 2012-08-31 NOTE — Patient Instructions (Addendum)
Your physician recommends that you schedule a follow-up appointment in: ONE YEAR 

## 2012-08-31 NOTE — Progress Notes (Signed)
HPI: Candice Meza is a 79 y/o patient here for annual visit with multiple cardiovascular risk factors, previous myocardial infarction but only modest coronary disease on coronary angiography in 1997 she has had no further cardiac issues, hypertension and hyperlipidemia are well controlled. She has been hospitalized for 8 day stay [facility] for biliary stent due to emergent cholecystitis with bile duct blockage from gallstone in Novemeber. Due for second procedure in April.She offers no cardiac complaint. She does not remember if she was seen by cardiologist during her recent hospitalization. She has dibilitating arthritis and is wheel chair bound. She is frail but is bright and talkative. Main complaint now is arthritis pain.   Allergies  Allergen Reactions  . Aspirin     GI symptoms  . Bee Venom   . Morphine And Related   . Sulfa Antibiotics     dyspnea  . Tetracyclines & Related   . Codeine Rash  . Pamelor Rash  . Penicillins Rash    Current Outpatient Prescriptions  Medication Sig Dispense Refill  . acetaminophen (TYLENOL) 650 MG CR tablet Take 1,300 mg by mouth 2 (two) times daily as needed. For pain      . albuterol (PROVENTIL HFA;VENTOLIN HFA) 108 (90 BASE) MCG/ACT inhaler Inhale 2 puffs into the lungs every 4 (four) hours as needed.        [name] albuterol (PROVENTIL) (2.5 MG/3ML) 0.083% nebulizer solution Take 2.5 mg by nebulization every 4 (four) hours as needed.      . ALPRAZolam (XANAX) 0.25 MG tablet Take 0.25 mg by mouth every 8 (eight) hours as needed. For anxiety      . aspirin 81 MG tablet Take 81 mg by mouth daily.        . beclomethasone (QVAR) 80 MCG/ACT inhaler Inhale 1 puff into the lungs 2 (two) times daily.        . Calcium Carb-Cholecalciferol (CALCIUM 500 +D) 500-400 MG-UNIT TABS Take 1 tablet by mouth daily.      . cetirizine (ZYRTEC) 10 MG tablet Take 10 mg by mouth daily.        . citalopram (CELEXA) 10 MG tablet Take 30 mg by mouth daily.      [name]  dextromethorphan (DELSYM) 30 MG/5ML liquid Take 60 mg by mouth 2 (two) times daily as needed. For  cough      . fluticasone (FLONASE) 50 MCG/ACT nasal spray Place 2 sprays into the nose daily.        . furosemide (LASIX) 20 MG tablet Take 20 mg by mouth daily.        . furosemide (LASIX) 40 MG tablet Take 40 mg by mouth as needed.      . GUAIFENESIN PO Take 600 mg by mouth 2 (two) times daily.      [name] KLOR-CON M20 20 MEQ tablet       . Multiple Vitamin (MULTIVITAMIN) tablet Take 1 tablet by mouth daily.        . nabumetone (RELAFEN) 500 MG tablet Take 500 mg by mouth 2 (two) times daily.      . nitroGLYCERIN (NITROSTAT) 0.4 MG SL tablet Place 0.4 mg under the tongue as directed.        [name] omeprazole (PRILOSEC) 40 MG capsule Take 40 mg by mouth daily.        Bertram Gala Glycol-Propyl Glycol (SYSTANE) 0.4-0.3 % SOLN Apply 1 drop to eye 3 (three) times daily.        . polyethylene glycol powder (GLYCOLAX/MIRALAX)  powder Take 17 g by mouth daily.      . potassium chloride (K-DUR) 10 MEQ tablet Take 20 mEq by mouth daily.      . pravastatin (PRAVACHOL) 40 MG tablet Take 40 mg by mouth at bedtime.        . promethazine (PHENERGAN) 25 MG tablet Take 12.5 mg by mouth every 4 (four) hours as needed. For nausea      . traMADol (ULTRAM) 50 MG tablet Take 50 mg by mouth every 6 (six) hours as needed. For pain      . vitamin E 400 UNIT capsule Take 400 Units by mouth daily.        . Wheat Dextrin (BENEFIBER PO) Take 30 mLs by mouth 2 (two) times daily. Mix two tablespoons in 8 ounces of water or juice and drink daily       No current facility-administered medications for this visit.    Past Medical History  Diagnosis Date  . MI, acute, non ST segment elevation 1997    Questionable lesion in the circumflex at catheterization; normal EF; chronic dyspnea without specific cause identified  . Hyperlipidemia   . Hypertension   . Postmenopausal     Estrogen replacement therapy  . Cough     Prolonged,  possible asthmatic bronchitis  . Benign essential tremor   . Osteopenia     Versus osteoporosis  . Macular degeneration   . Degenerative joint disease     versus rheumatoid arthritis  . Repeated falls     Past Surgical History  Procedure Laterality Date  . Bladder suspension    . Inguinal hernia repair      Left  . Foot surgery      Bilateral  . Ercp  04/18/2012    Procedure: ENDOSCOPIC RETROGRADE CHOLANGIOPANCREATOGRAPHY (ERCP);  Surgeon: Malissa Hippo, MD;  Location: [facility];  Service: Endoscopy;  Laterality: N/A;  Diagnostic    ZOX:WRUEAV of systems complete and found to be negative unless listed above PHYSICAL EXAM BP 115/65  Pulse 60  Ht 4\' 11"  (1.499 m)  Wt 119 lb 12 oz (54.318 kg)  BMI 24.17 kg/m2  SpO2 94%  General: Well developed, well nourished, in no acute distress Head: Eyes PERRLA, No xanthomas.   Normal cephalic and atramatic  Lungs: Clear bilaterally to auscultation and percussion. Heart: HRRR S1 S2, bradycardia, no MRG pulses are 2+ & equal.            No carotid bruit. No JVD.  No abdominal bruits. No femoral bruits. Abdomen: Bowel sounds are positive, abdomen soft and non-tender without masses or                  Hernia's noted. Msk:  Back normal, normal gait. Normal strength and tone for age. Extremities: No clubbing, cyanosis or edema. Significant deformity of her hands and fingers from RA  DP +1 Neuro: Alert and oriented X 3. Psych:  Good affect, responds appropriately  WUJ:[id number] bradycardia rate of  58 bpm.     ASSESSMENT AND PLAN

## 2012-08-31 NOTE — Assessment & Plan Note (Signed)
Excellent control of BP. Will not order labs as she is also being seen by PCP and by Dr. Karilyn Cota who are ordering follow up studies. She is medically compliant. Will continue current medications as directed.

## 2012-08-31 NOTE — Assessment & Plan Note (Signed)
She offers no cardiac complaint. Will not plan testing at this time as she is asymptomatic. Continue medical management and risk factor management.

## 2012-08-31 NOTE — Assessment & Plan Note (Signed)
Continue statin as directed. Labs are due per PCP visit.

## 2012-08-31 NOTE — Progress Notes (Deleted)
Name: Candice Meza    DOB: 08-07-1921  Age: 79 y.o.  MR#: [id number]       PCP:  Estanislado Pandy, MD      Insurance: Payor: MEDICARE  Plan: MEDICARE PART A AND B  Product Type: *No Product type*    CC:   No chief complaint on file.   VS Filed Vitals:   08/31/12 1428  BP: 115/65  Pulse: 60  Height: 4\' 11"  (1.499 m)  Weight: 119 lb 12 oz (54.318 kg)  SpO2: 94%    Weights Current Weight  08/31/12 119 lb 12 oz (54.318 kg)  04/18/12 119 lb 14.9 oz (54.4 kg)  04/18/12 119 lb 14.9 oz (54.4 kg)    Blood Pressure  BP Readings from Last 3 Encounters:  08/31/12 115/65  04/18/12 101/62  04/18/12 101/62     Admit date:  (Not on file) Last encounter with [facility]:  08/16/2012   Allergy Aspirin; Bee venom; Morphine and related; Sulfa antibiotics; Tetracyclines & related; Codeine; Pamelor; and Penicillins  Current Outpatient Prescriptions  Medication Sig Dispense Refill  . acetaminophen (TYLENOL) 650 MG CR tablet Take 1,300 mg by mouth 2 (two) times daily as needed. For pain      . albuterol (PROVENTIL HFA;VENTOLIN HFA) 108 (90 BASE) MCG/ACT inhaler Inhale 2 puffs into the lungs every 4 (four) hours as needed.        [name] albuterol (PROVENTIL) (2.5 MG/3ML) 0.083% nebulizer solution Take 2.5 mg by nebulization every 4 (four) hours as needed.      . ALPRAZolam (XANAX) 0.25 MG tablet Take 0.25 mg by mouth every 8 (eight) hours as needed. For anxiety      . aspirin 81 MG tablet Take 81 mg by mouth daily.        . beclomethasone (QVAR) 80 MCG/ACT inhaler Inhale 1 puff into the lungs 2 (two) times daily.        . Calcium Carb-Cholecalciferol (CALCIUM 500 +D) 500-400 MG-UNIT TABS Take 1 tablet by mouth daily.      . cetirizine (ZYRTEC) 10 MG tablet Take 10 mg by mouth daily.        . citalopram (CELEXA) 10 MG tablet Take 30 mg by mouth daily.      [name] dextromethorphan (DELSYM) 30 MG/5ML liquid Take 60 mg by mouth 2 (two) times daily as needed. For  cough      . fluticasone (FLONASE) 50 MCG/ACT nasal  spray Place 2 sprays into the nose daily.        . furosemide (LASIX) 20 MG tablet Take 20 mg by mouth daily.        . furosemide (LASIX) 40 MG tablet Take 40 mg by mouth as needed.      . GUAIFENESIN PO Take 600 mg by mouth 2 (two) times daily.      [name] KLOR-CON M20 20 MEQ tablet       . Multiple Vitamin (MULTIVITAMIN) tablet Take 1 tablet by mouth daily.        . nabumetone (RELAFEN) 500 MG tablet Take 500 mg by mouth 2 (two) times daily.      . nitroGLYCERIN (NITROSTAT) 0.4 MG SL tablet Place 0.4 mg under the tongue as directed.        [name] omeprazole (PRILOSEC) 40 MG capsule Take 40 mg by mouth daily.        Bertram Gala Glycol-Propyl Glycol (SYSTANE) 0.4-0.3 % SOLN Apply 1 drop to eye 3 (three) times daily.        [name]  polyethylene glycol powder (GLYCOLAX/MIRALAX) powder Take 17 g by mouth daily.      . potassium chloride (K-DUR) 10 MEQ tablet Take 20 mEq by mouth daily.      . pravastatin (PRAVACHOL) 40 MG tablet Take 40 mg by mouth at bedtime.        . promethazine (PHENERGAN) 25 MG tablet Take 12.5 mg by mouth every 4 (four) hours as needed. For nausea      . traMADol (ULTRAM) 50 MG tablet Take 50 mg by mouth every 6 (six) hours as needed. For pain      . vitamin E 400 UNIT capsule Take 400 Units by mouth daily.        . Wheat Dextrin (BENEFIBER PO) Take 30 mLs by mouth 2 (two) times daily. Mix two tablespoons in 8 ounces of water or juice and drink daily       No current facility-administered medications for this visit.    Discontinued Meds:   There are no discontinued medications.  Patient Active Problem List  Diagnosis  . HYPERLIPIDEMIA  . HYPERTENSION  . Gastroesophageal reflux disease  . BILIARY TRACT DISORDER  . DEGENERATIVE JOINT DISEASE  . OSTEOPENIA  . Asthmatic bronchitis , chronic  . Repeated falls  . MI, acute, non ST segment elevation  . Macular degeneration  . Anemia  . Nausea and vomiting in adult  . Abdominal pain  . Transaminitis  . UTI (lower urinary tract  infection)  . CAD (coronary artery disease)    LABS    Component Value Date/Time   NA 135 04/18/2012 0442   NA 136 04/17/2012 2152   NA 136 08/12/2009   K 3.3* 04/18/2012 0442   K 3.4* 04/17/2012 2152   K 3.4 08/12/2009   CL 98 04/18/2012 0442   CL 98 04/17/2012 2152   CL 99 08/12/2009   CO2 29 04/18/2012 0442   CO2 27 04/17/2012 2152   CO2 27.0 08/12/2009   GLUCOSE 121* 04/18/2012 0442   GLUCOSE 141* 04/17/2012 2152   GLUCOSE 96 08/12/2009   BUN 15 04/18/2012 0442   BUN 20 04/17/2012 2152   BUN 4 08/12/2009   CREATININE 0.30* 04/18/2012 0442   CREATININE 0.34* 04/17/2012 2152   CREATININE 0.20 08/12/2009   CALCIUM 8.9 04/18/2012 0442   CALCIUM 9.3 04/17/2012 2152   CALCIUM 7.6 08/12/2009   GFRNONAA >90 04/18/2012 0442   GFRNONAA >90 04/17/2012 2152   GFRNONAA >60 08/12/2009   GFRAA >90 04/18/2012 0442   GFRAA >90 04/17/2012 2152   CMP     Component Value Date/Time   NA 135 04/18/2012 0442   K 3.3* 04/18/2012 0442   CL 98 04/18/2012 0442   CO2 29 04/18/2012 0442   GLUCOSE 121* 04/18/2012 0442   BUN 15 04/18/2012 0442   CREATININE 0.30* 04/18/2012 0442   CALCIUM 8.9 04/18/2012 0442   PROT 6.5 04/18/2012 0442   ALBUMIN 2.8* 04/18/2012 0442   AST 342* 04/18/2012 0442   ALT 306* 04/18/2012 0442   ALKPHOS 364* 04/18/2012 0442   BILITOT 3.4* 04/18/2012 0442   GFRNONAA >90 04/18/2012 0442   GFRAA >90 04/18/2012 0442       Component Value Date/Time   WBC 5.9 04/18/2012 0442   WBC 5.1 04/17/2012 2152   WBC 6.2 05/01/2008   HGB 12.4 04/18/2012 0442   HGB 12.5 04/17/2012 2152   HGB 11.9 05/01/2008   HCT 36.8 04/18/2012 0442   HCT 36.2 04/17/2012 2152   HCT 37.7 05/01/2008  MCV 99.7 04/18/2012 0442   MCV 99.2 04/17/2012 2152   MCV 102.4 05/01/2008    Lipid Panel     Component Value Date/Time   CHOL 144 05/07/2009 2135   TRIG 117 05/07/2009 2135   HDL 54 05/07/2009 2135   CHOLHDL 2.7 Ratio 05/07/2009 2135   VLDL 23 05/07/2009 2135   LDLCALC 67 05/07/2009 2135    ABG     Component Value Date/Time   PHART 7.420* 01/25/2007 1406   PCO2ART 40.1 01/25/2007 1406   PO2ART 72.2* 01/25/2007 1406   HCO3 25.5* 01/25/2007 1406   TCO2 22.5 01/25/2007 1406   O2SAT 92.6 01/25/2007 1406     Lab Results  Component Value Date   TSH 0.936 04/18/2012   BNP (last 3 results) No results found for this basename: PROBNP,  in the last 8760 hours Cardiac Panel (last 3 results) No results found for this basename: CKTOTAL, CKMB, TROPONINI, RELINDX,  in the last 72 hours  Iron/TIBC/Ferritin No results found for this basename: iron, tibc, ferritin     EKG Orders placed in visit on 08/31/12  . EKG 12-LEAD     Prior Assessment and Plan Problem List as of 08/31/2012     ICD-9-CM   CAD (coronary artery disease)   HYPERLIPIDEMIA   Last Assessment & Plan   01/24/2012 Office Visit Written 01/24/2012  2:35 PM by Kathlen Brunswick, MD     Hyperlipidemia remains under excellent control with current modest therapy, which will be continued.    HYPERTENSION   Last Assessment & Plan   01/24/2012 Office Visit Written 01/24/2012  2:36 PM by Kathlen Brunswick, MD     Control of blood pressure has been excellent both in office and had the [facility].  Current therapy will be continued.    Gastroesophageal reflux disease   Last Assessment & Plan   06/24/2011 Office Visit Edited 06/24/2011  1:16 PM by Kathlen Brunswick, MD     Patient is being treated with a nonsteroidal plus aspirin.  While she derives some protection from treatment with a PPI, she should be followed closely to exclude occult GI blood loss, , especially since she is already anemic.  Stool samples for Hemoccult testing had been requested.    BILIARY TRACT DISORDER   Last Assessment & Plan   06/24/2011 Office Visit Written 06/24/2011  2:51 PM by Kathlen Brunswick, MD     Mildly abnormal LFTs with previous evaluation demonstrating minor chronic biliary disease.  Continued observation is appropriate.    DEGENERATIVE JOINT  DISEASE   OSTEOPENIA   Asthmatic bronchitis , chronic   Last Assessment & Plan   06/24/2011 Office Visit Written 06/24/2011  1:14 PM by Kathlen Brunswick, MD     Excellent medical regime has been prescribed for patient's chronic lung disease.  Once pneumonia has been adequately treated, respiratory status should improve.    Repeated falls   Last Assessment & Plan   01/24/2012 Office Visit Written 01/24/2012  2:36 PM by Kathlen Brunswick, MD     No falls since entering the [facility] in 2009    MI, acute, non ST segment elevation   Last Assessment & Plan   06/24/2011 Office Visit Written 06/24/2011  2:54 PM by Kathlen Brunswick, MD     Ms. Bhardwaj has an area of hypoperfused myocardium in the distribution of her original circumflex event 15 years ago.  In the absence of symptoms and considering the patient's advanced age, no  further evaluation or treatment is warranted.    Macular degeneration   Anemia   Last Assessment & Plan   01/24/2012 Office Visit Written 01/24/2012  2:33 PM by Kathlen Brunswick, MD     Anemia has resolved without any specific etiology being identified.  Mild macrocytosis persists.    Nausea and vomiting in adult   Abdominal pain   Transaminitis   UTI (lower urinary tract infection)       Imaging: No results found.

## 2012-09-05 DIAGNOSIS — R7989 Other specified abnormal findings of blood chemistry: Secondary | ICD-10-CM | POA: Diagnosis not present

## 2012-09-05 DIAGNOSIS — K839 Disease of biliary tract, unspecified: Secondary | ICD-10-CM | POA: Diagnosis not present

## 2012-09-05 DIAGNOSIS — I10 Essential (primary) hypertension: Secondary | ICD-10-CM | POA: Diagnosis not present

## 2012-09-05 DIAGNOSIS — D500 Iron deficiency anemia secondary to blood loss (chronic): Secondary | ICD-10-CM | POA: Diagnosis not present

## 2012-10-07 DIAGNOSIS — K8309 Other cholangitis: Secondary | ICD-10-CM | POA: Diagnosis not present

## 2012-10-11 DIAGNOSIS — I10 Essential (primary) hypertension: Secondary | ICD-10-CM | POA: Diagnosis not present

## 2012-10-11 DIAGNOSIS — K8309 Other cholangitis: Secondary | ICD-10-CM | POA: Diagnosis not present

## 2012-10-11 DIAGNOSIS — K831 Obstruction of bile duct: Secondary | ICD-10-CM | POA: Diagnosis not present

## 2012-10-11 DIAGNOSIS — Z859 Personal history of malignant neoplasm, unspecified: Secondary | ICD-10-CM | POA: Diagnosis not present

## 2012-10-11 DIAGNOSIS — F411 Generalized anxiety disorder: Secondary | ICD-10-CM | POA: Diagnosis not present

## 2012-10-11 DIAGNOSIS — Z880 Allergy status to penicillin: Secondary | ICD-10-CM | POA: Diagnosis not present

## 2012-10-11 DIAGNOSIS — Z885 Allergy status to narcotic agent status: Secondary | ICD-10-CM | POA: Diagnosis not present

## 2012-10-11 DIAGNOSIS — E785 Hyperlipidemia, unspecified: Secondary | ICD-10-CM | POA: Diagnosis not present

## 2012-10-11 DIAGNOSIS — K225 Diverticulum of esophagus, acquired: Secondary | ICD-10-CM | POA: Diagnosis not present

## 2012-10-11 DIAGNOSIS — J45909 Unspecified asthma, uncomplicated: Secondary | ICD-10-CM | POA: Diagnosis not present

## 2012-10-11 DIAGNOSIS — K8050 Calculus of bile duct without cholangitis or cholecystitis without obstruction: Secondary | ICD-10-CM | POA: Diagnosis not present

## 2012-10-11 DIAGNOSIS — K449 Diaphragmatic hernia without obstruction or gangrene: Secondary | ICD-10-CM | POA: Diagnosis not present

## 2012-10-11 DIAGNOSIS — K5710 Diverticulosis of small intestine without perforation or abscess without bleeding: Secondary | ICD-10-CM | POA: Diagnosis not present

## 2012-10-11 DIAGNOSIS — Z886 Allergy status to analgesic agent status: Secondary | ICD-10-CM | POA: Diagnosis not present

## 2012-10-11 DIAGNOSIS — I252 Old myocardial infarction: Secondary | ICD-10-CM | POA: Diagnosis not present

## 2012-10-11 DIAGNOSIS — F329 Major depressive disorder, single episode, unspecified: Secondary | ICD-10-CM | POA: Diagnosis not present

## 2012-10-11 DIAGNOSIS — Z888 Allergy status to other drugs, medicaments and biological substances status: Secondary | ICD-10-CM | POA: Diagnosis not present

## 2012-10-12 DIAGNOSIS — R141 Gas pain: Secondary | ICD-10-CM | POA: Diagnosis not present

## 2012-10-12 DIAGNOSIS — I10 Essential (primary) hypertension: Secondary | ICD-10-CM | POA: Diagnosis not present

## 2012-10-12 DIAGNOSIS — Z882 Allergy status to sulfonamides status: Secondary | ICD-10-CM | POA: Diagnosis not present

## 2012-10-12 DIAGNOSIS — I252 Old myocardial infarction: Secondary | ICD-10-CM | POA: Diagnosis not present

## 2012-10-12 DIAGNOSIS — Z885 Allergy status to narcotic agent status: Secondary | ICD-10-CM | POA: Diagnosis not present

## 2012-10-12 DIAGNOSIS — Z7982 Long term (current) use of aspirin: Secondary | ICD-10-CM | POA: Diagnosis not present

## 2012-10-12 DIAGNOSIS — R112 Nausea with vomiting, unspecified: Secondary | ICD-10-CM | POA: Diagnosis not present

## 2012-10-12 DIAGNOSIS — R142 Eructation: Secondary | ICD-10-CM | POA: Diagnosis not present

## 2012-10-12 DIAGNOSIS — Z79899 Other long term (current) drug therapy: Secondary | ICD-10-CM | POA: Diagnosis not present

## 2012-10-12 DIAGNOSIS — R109 Unspecified abdominal pain: Secondary | ICD-10-CM | POA: Diagnosis not present

## 2012-10-12 DIAGNOSIS — Z880 Allergy status to penicillin: Secondary | ICD-10-CM | POA: Diagnosis not present

## 2012-10-12 DIAGNOSIS — Z9889 Other specified postprocedural states: Secondary | ICD-10-CM | POA: Diagnosis not present

## 2012-10-13 DIAGNOSIS — R0602 Shortness of breath: Secondary | ICD-10-CM | POA: Diagnosis not present

## 2012-10-13 DIAGNOSIS — R062 Wheezing: Secondary | ICD-10-CM | POA: Diagnosis not present

## 2012-10-17 DIAGNOSIS — F339 Major depressive disorder, recurrent, unspecified: Secondary | ICD-10-CM | POA: Diagnosis not present

## 2012-10-28 DIAGNOSIS — F329 Major depressive disorder, single episode, unspecified: Secondary | ICD-10-CM | POA: Diagnosis not present

## 2012-11-02 DIAGNOSIS — F329 Major depressive disorder, single episode, unspecified: Secondary | ICD-10-CM | POA: Diagnosis not present

## 2012-11-07 DIAGNOSIS — F329 Major depressive disorder, single episode, unspecified: Secondary | ICD-10-CM | POA: Diagnosis not present

## 2012-11-08 DIAGNOSIS — M79609 Pain in unspecified limb: Secondary | ICD-10-CM | POA: Diagnosis not present

## 2012-11-08 DIAGNOSIS — B351 Tinea unguium: Secondary | ICD-10-CM | POA: Diagnosis not present

## 2012-11-10 DIAGNOSIS — F329 Major depressive disorder, single episode, unspecified: Secondary | ICD-10-CM | POA: Diagnosis not present

## 2012-11-14 DIAGNOSIS — F329 Major depressive disorder, single episode, unspecified: Secondary | ICD-10-CM | POA: Diagnosis not present

## 2012-11-15 DIAGNOSIS — J189 Pneumonia, unspecified organism: Secondary | ICD-10-CM | POA: Diagnosis not present

## 2012-11-18 DIAGNOSIS — F329 Major depressive disorder, single episode, unspecified: Secondary | ICD-10-CM | POA: Diagnosis not present

## 2012-12-02 DIAGNOSIS — K8309 Other cholangitis: Secondary | ICD-10-CM | POA: Diagnosis not present

## 2012-12-13 DIAGNOSIS — F329 Major depressive disorder, single episode, unspecified: Secondary | ICD-10-CM | POA: Diagnosis not present

## 2012-12-15 DIAGNOSIS — F329 Major depressive disorder, single episode, unspecified: Secondary | ICD-10-CM | POA: Diagnosis not present

## 2012-12-17 DIAGNOSIS — H353190 Nonexudative age-related macular degeneration, unspecified eye, stage unspecified: Secondary | ICD-10-CM | POA: Diagnosis not present

## 2012-12-24 DIAGNOSIS — R279 Unspecified lack of coordination: Secondary | ICD-10-CM | POA: Diagnosis not present

## 2012-12-24 DIAGNOSIS — R1312 Dysphagia, oropharyngeal phase: Secondary | ICD-10-CM | POA: Diagnosis not present

## 2012-12-24 DIAGNOSIS — M069 Rheumatoid arthritis, unspecified: Secondary | ICD-10-CM | POA: Diagnosis not present

## 2012-12-24 DIAGNOSIS — J189 Pneumonia, unspecified organism: Secondary | ICD-10-CM | POA: Diagnosis not present

## 2012-12-24 DIAGNOSIS — K225 Diverticulum of esophagus, acquired: Secondary | ICD-10-CM | POA: Diagnosis not present

## 2012-12-24 DIAGNOSIS — R269 Unspecified abnormalities of gait and mobility: Secondary | ICD-10-CM | POA: Diagnosis not present

## 2012-12-25 DIAGNOSIS — R269 Unspecified abnormalities of gait and mobility: Secondary | ICD-10-CM | POA: Diagnosis not present

## 2012-12-25 DIAGNOSIS — R1312 Dysphagia, oropharyngeal phase: Secondary | ICD-10-CM | POA: Diagnosis not present

## 2012-12-25 DIAGNOSIS — J189 Pneumonia, unspecified organism: Secondary | ICD-10-CM | POA: Diagnosis not present

## 2012-12-25 DIAGNOSIS — K225 Diverticulum of esophagus, acquired: Secondary | ICD-10-CM | POA: Diagnosis not present

## 2012-12-25 DIAGNOSIS — M069 Rheumatoid arthritis, unspecified: Secondary | ICD-10-CM | POA: Diagnosis not present

## 2012-12-25 DIAGNOSIS — R279 Unspecified lack of coordination: Secondary | ICD-10-CM | POA: Diagnosis not present

## 2012-12-26 DIAGNOSIS — R1312 Dysphagia, oropharyngeal phase: Secondary | ICD-10-CM | POA: Diagnosis not present

## 2012-12-26 DIAGNOSIS — R279 Unspecified lack of coordination: Secondary | ICD-10-CM | POA: Diagnosis not present

## 2012-12-26 DIAGNOSIS — J189 Pneumonia, unspecified organism: Secondary | ICD-10-CM | POA: Diagnosis not present

## 2012-12-26 DIAGNOSIS — K225 Diverticulum of esophagus, acquired: Secondary | ICD-10-CM | POA: Diagnosis not present

## 2012-12-26 DIAGNOSIS — R269 Unspecified abnormalities of gait and mobility: Secondary | ICD-10-CM | POA: Diagnosis not present

## 2012-12-26 DIAGNOSIS — M069 Rheumatoid arthritis, unspecified: Secondary | ICD-10-CM | POA: Diagnosis not present

## 2012-12-27 DIAGNOSIS — M069 Rheumatoid arthritis, unspecified: Secondary | ICD-10-CM | POA: Diagnosis not present

## 2012-12-27 DIAGNOSIS — R062 Wheezing: Secondary | ICD-10-CM | POA: Diagnosis not present

## 2012-12-27 DIAGNOSIS — R279 Unspecified lack of coordination: Secondary | ICD-10-CM | POA: Diagnosis not present

## 2012-12-27 DIAGNOSIS — R269 Unspecified abnormalities of gait and mobility: Secondary | ICD-10-CM | POA: Diagnosis not present

## 2012-12-27 DIAGNOSIS — R1312 Dysphagia, oropharyngeal phase: Secondary | ICD-10-CM | POA: Diagnosis not present

## 2012-12-27 DIAGNOSIS — J189 Pneumonia, unspecified organism: Secondary | ICD-10-CM | POA: Diagnosis not present

## 2012-12-27 DIAGNOSIS — K225 Diverticulum of esophagus, acquired: Secondary | ICD-10-CM | POA: Diagnosis not present

## 2012-12-28 DIAGNOSIS — R279 Unspecified lack of coordination: Secondary | ICD-10-CM | POA: Diagnosis not present

## 2012-12-28 DIAGNOSIS — R269 Unspecified abnormalities of gait and mobility: Secondary | ICD-10-CM | POA: Diagnosis not present

## 2012-12-28 DIAGNOSIS — K225 Diverticulum of esophagus, acquired: Secondary | ICD-10-CM | POA: Diagnosis not present

## 2012-12-28 DIAGNOSIS — M069 Rheumatoid arthritis, unspecified: Secondary | ICD-10-CM | POA: Diagnosis not present

## 2012-12-28 DIAGNOSIS — R1312 Dysphagia, oropharyngeal phase: Secondary | ICD-10-CM | POA: Diagnosis not present

## 2012-12-28 DIAGNOSIS — J189 Pneumonia, unspecified organism: Secondary | ICD-10-CM | POA: Diagnosis not present

## 2012-12-31 DIAGNOSIS — R269 Unspecified abnormalities of gait and mobility: Secondary | ICD-10-CM | POA: Diagnosis not present

## 2012-12-31 DIAGNOSIS — M069 Rheumatoid arthritis, unspecified: Secondary | ICD-10-CM | POA: Diagnosis not present

## 2012-12-31 DIAGNOSIS — R1312 Dysphagia, oropharyngeal phase: Secondary | ICD-10-CM | POA: Diagnosis not present

## 2012-12-31 DIAGNOSIS — R279 Unspecified lack of coordination: Secondary | ICD-10-CM | POA: Diagnosis not present

## 2012-12-31 DIAGNOSIS — J189 Pneumonia, unspecified organism: Secondary | ICD-10-CM | POA: Diagnosis not present

## 2012-12-31 DIAGNOSIS — K225 Diverticulum of esophagus, acquired: Secondary | ICD-10-CM | POA: Diagnosis not present

## 2013-01-01 DIAGNOSIS — K225 Diverticulum of esophagus, acquired: Secondary | ICD-10-CM | POA: Diagnosis not present

## 2013-01-01 DIAGNOSIS — R279 Unspecified lack of coordination: Secondary | ICD-10-CM | POA: Diagnosis not present

## 2013-01-01 DIAGNOSIS — M2550 Pain in unspecified joint: Secondary | ICD-10-CM | POA: Diagnosis not present

## 2013-01-01 DIAGNOSIS — M069 Rheumatoid arthritis, unspecified: Secondary | ICD-10-CM | POA: Diagnosis not present

## 2013-01-01 DIAGNOSIS — J189 Pneumonia, unspecified organism: Secondary | ICD-10-CM | POA: Diagnosis not present

## 2013-01-01 DIAGNOSIS — R1312 Dysphagia, oropharyngeal phase: Secondary | ICD-10-CM | POA: Diagnosis not present

## 2013-01-01 DIAGNOSIS — R269 Unspecified abnormalities of gait and mobility: Secondary | ICD-10-CM | POA: Diagnosis not present

## 2013-01-02 DIAGNOSIS — M069 Rheumatoid arthritis, unspecified: Secondary | ICD-10-CM | POA: Diagnosis not present

## 2013-01-02 DIAGNOSIS — J189 Pneumonia, unspecified organism: Secondary | ICD-10-CM | POA: Diagnosis not present

## 2013-01-02 DIAGNOSIS — K225 Diverticulum of esophagus, acquired: Secondary | ICD-10-CM | POA: Diagnosis not present

## 2013-01-02 DIAGNOSIS — R279 Unspecified lack of coordination: Secondary | ICD-10-CM | POA: Diagnosis not present

## 2013-01-02 DIAGNOSIS — M2550 Pain in unspecified joint: Secondary | ICD-10-CM | POA: Diagnosis not present

## 2013-01-02 DIAGNOSIS — R1312 Dysphagia, oropharyngeal phase: Secondary | ICD-10-CM | POA: Diagnosis not present

## 2013-01-03 DIAGNOSIS — K225 Diverticulum of esophagus, acquired: Secondary | ICD-10-CM | POA: Diagnosis not present

## 2013-01-03 DIAGNOSIS — R1312 Dysphagia, oropharyngeal phase: Secondary | ICD-10-CM | POA: Diagnosis not present

## 2013-01-03 DIAGNOSIS — M069 Rheumatoid arthritis, unspecified: Secondary | ICD-10-CM | POA: Diagnosis not present

## 2013-01-03 DIAGNOSIS — J189 Pneumonia, unspecified organism: Secondary | ICD-10-CM | POA: Diagnosis not present

## 2013-01-03 DIAGNOSIS — R279 Unspecified lack of coordination: Secondary | ICD-10-CM | POA: Diagnosis not present

## 2013-01-03 DIAGNOSIS — M2550 Pain in unspecified joint: Secondary | ICD-10-CM | POA: Diagnosis not present

## 2013-01-04 DIAGNOSIS — M069 Rheumatoid arthritis, unspecified: Secondary | ICD-10-CM | POA: Diagnosis not present

## 2013-01-04 DIAGNOSIS — K225 Diverticulum of esophagus, acquired: Secondary | ICD-10-CM | POA: Diagnosis not present

## 2013-01-04 DIAGNOSIS — R279 Unspecified lack of coordination: Secondary | ICD-10-CM | POA: Diagnosis not present

## 2013-01-04 DIAGNOSIS — J189 Pneumonia, unspecified organism: Secondary | ICD-10-CM | POA: Diagnosis not present

## 2013-01-04 DIAGNOSIS — R1312 Dysphagia, oropharyngeal phase: Secondary | ICD-10-CM | POA: Diagnosis not present

## 2013-01-04 DIAGNOSIS — M2550 Pain in unspecified joint: Secondary | ICD-10-CM | POA: Diagnosis not present

## 2013-01-07 DIAGNOSIS — K225 Diverticulum of esophagus, acquired: Secondary | ICD-10-CM | POA: Diagnosis not present

## 2013-01-07 DIAGNOSIS — R279 Unspecified lack of coordination: Secondary | ICD-10-CM | POA: Diagnosis not present

## 2013-01-07 DIAGNOSIS — R1312 Dysphagia, oropharyngeal phase: Secondary | ICD-10-CM | POA: Diagnosis not present

## 2013-01-07 DIAGNOSIS — J189 Pneumonia, unspecified organism: Secondary | ICD-10-CM | POA: Diagnosis not present

## 2013-01-07 DIAGNOSIS — M069 Rheumatoid arthritis, unspecified: Secondary | ICD-10-CM | POA: Diagnosis not present

## 2013-01-07 DIAGNOSIS — M2550 Pain in unspecified joint: Secondary | ICD-10-CM | POA: Diagnosis not present

## 2013-01-08 DIAGNOSIS — M2550 Pain in unspecified joint: Secondary | ICD-10-CM | POA: Diagnosis not present

## 2013-01-08 DIAGNOSIS — R279 Unspecified lack of coordination: Secondary | ICD-10-CM | POA: Diagnosis not present

## 2013-01-08 DIAGNOSIS — K225 Diverticulum of esophagus, acquired: Secondary | ICD-10-CM | POA: Diagnosis not present

## 2013-01-08 DIAGNOSIS — J189 Pneumonia, unspecified organism: Secondary | ICD-10-CM | POA: Diagnosis not present

## 2013-01-08 DIAGNOSIS — R1312 Dysphagia, oropharyngeal phase: Secondary | ICD-10-CM | POA: Diagnosis not present

## 2013-01-08 DIAGNOSIS — M069 Rheumatoid arthritis, unspecified: Secondary | ICD-10-CM | POA: Diagnosis not present

## 2013-01-09 DIAGNOSIS — M069 Rheumatoid arthritis, unspecified: Secondary | ICD-10-CM | POA: Diagnosis not present

## 2013-01-09 DIAGNOSIS — R279 Unspecified lack of coordination: Secondary | ICD-10-CM | POA: Diagnosis not present

## 2013-01-09 DIAGNOSIS — K225 Diverticulum of esophagus, acquired: Secondary | ICD-10-CM | POA: Diagnosis not present

## 2013-01-09 DIAGNOSIS — R1312 Dysphagia, oropharyngeal phase: Secondary | ICD-10-CM | POA: Diagnosis not present

## 2013-01-09 DIAGNOSIS — M2550 Pain in unspecified joint: Secondary | ICD-10-CM | POA: Diagnosis not present

## 2013-01-09 DIAGNOSIS — J189 Pneumonia, unspecified organism: Secondary | ICD-10-CM | POA: Diagnosis not present

## 2013-01-10 DIAGNOSIS — R1312 Dysphagia, oropharyngeal phase: Secondary | ICD-10-CM | POA: Diagnosis not present

## 2013-01-10 DIAGNOSIS — K225 Diverticulum of esophagus, acquired: Secondary | ICD-10-CM | POA: Diagnosis not present

## 2013-01-10 DIAGNOSIS — M069 Rheumatoid arthritis, unspecified: Secondary | ICD-10-CM | POA: Diagnosis not present

## 2013-01-10 DIAGNOSIS — M2550 Pain in unspecified joint: Secondary | ICD-10-CM | POA: Diagnosis not present

## 2013-01-10 DIAGNOSIS — J189 Pneumonia, unspecified organism: Secondary | ICD-10-CM | POA: Diagnosis not present

## 2013-01-10 DIAGNOSIS — R279 Unspecified lack of coordination: Secondary | ICD-10-CM | POA: Diagnosis not present

## 2013-01-11 DIAGNOSIS — M069 Rheumatoid arthritis, unspecified: Secondary | ICD-10-CM | POA: Diagnosis not present

## 2013-01-11 DIAGNOSIS — K225 Diverticulum of esophagus, acquired: Secondary | ICD-10-CM | POA: Diagnosis not present

## 2013-01-11 DIAGNOSIS — R1312 Dysphagia, oropharyngeal phase: Secondary | ICD-10-CM | POA: Diagnosis not present

## 2013-01-11 DIAGNOSIS — J189 Pneumonia, unspecified organism: Secondary | ICD-10-CM | POA: Diagnosis not present

## 2013-01-11 DIAGNOSIS — M2550 Pain in unspecified joint: Secondary | ICD-10-CM | POA: Diagnosis not present

## 2013-01-11 DIAGNOSIS — R279 Unspecified lack of coordination: Secondary | ICD-10-CM | POA: Diagnosis not present

## 2013-01-12 DIAGNOSIS — K225 Diverticulum of esophagus, acquired: Secondary | ICD-10-CM | POA: Diagnosis not present

## 2013-01-12 DIAGNOSIS — J189 Pneumonia, unspecified organism: Secondary | ICD-10-CM | POA: Diagnosis not present

## 2013-01-12 DIAGNOSIS — R279 Unspecified lack of coordination: Secondary | ICD-10-CM | POA: Diagnosis not present

## 2013-01-12 DIAGNOSIS — M2550 Pain in unspecified joint: Secondary | ICD-10-CM | POA: Diagnosis not present

## 2013-01-12 DIAGNOSIS — M069 Rheumatoid arthritis, unspecified: Secondary | ICD-10-CM | POA: Diagnosis not present

## 2013-01-12 DIAGNOSIS — R1312 Dysphagia, oropharyngeal phase: Secondary | ICD-10-CM | POA: Diagnosis not present

## 2013-01-14 DIAGNOSIS — J189 Pneumonia, unspecified organism: Secondary | ICD-10-CM | POA: Diagnosis not present

## 2013-01-14 DIAGNOSIS — M2550 Pain in unspecified joint: Secondary | ICD-10-CM | POA: Diagnosis not present

## 2013-01-14 DIAGNOSIS — K225 Diverticulum of esophagus, acquired: Secondary | ICD-10-CM | POA: Diagnosis not present

## 2013-01-14 DIAGNOSIS — R279 Unspecified lack of coordination: Secondary | ICD-10-CM | POA: Diagnosis not present

## 2013-01-14 DIAGNOSIS — M069 Rheumatoid arthritis, unspecified: Secondary | ICD-10-CM | POA: Diagnosis not present

## 2013-01-14 DIAGNOSIS — R1312 Dysphagia, oropharyngeal phase: Secondary | ICD-10-CM | POA: Diagnosis not present

## 2013-01-15 DIAGNOSIS — J189 Pneumonia, unspecified organism: Secondary | ICD-10-CM | POA: Diagnosis not present

## 2013-01-15 DIAGNOSIS — R279 Unspecified lack of coordination: Secondary | ICD-10-CM | POA: Diagnosis not present

## 2013-01-15 DIAGNOSIS — R1312 Dysphagia, oropharyngeal phase: Secondary | ICD-10-CM | POA: Diagnosis not present

## 2013-01-15 DIAGNOSIS — M2550 Pain in unspecified joint: Secondary | ICD-10-CM | POA: Diagnosis not present

## 2013-01-15 DIAGNOSIS — K225 Diverticulum of esophagus, acquired: Secondary | ICD-10-CM | POA: Diagnosis not present

## 2013-01-15 DIAGNOSIS — M069 Rheumatoid arthritis, unspecified: Secondary | ICD-10-CM | POA: Diagnosis not present

## 2013-01-16 DIAGNOSIS — R1312 Dysphagia, oropharyngeal phase: Secondary | ICD-10-CM | POA: Diagnosis not present

## 2013-01-16 DIAGNOSIS — R279 Unspecified lack of coordination: Secondary | ICD-10-CM | POA: Diagnosis not present

## 2013-01-16 DIAGNOSIS — K225 Diverticulum of esophagus, acquired: Secondary | ICD-10-CM | POA: Diagnosis not present

## 2013-01-16 DIAGNOSIS — J189 Pneumonia, unspecified organism: Secondary | ICD-10-CM | POA: Diagnosis not present

## 2013-01-16 DIAGNOSIS — M069 Rheumatoid arthritis, unspecified: Secondary | ICD-10-CM | POA: Diagnosis not present

## 2013-01-16 DIAGNOSIS — M2550 Pain in unspecified joint: Secondary | ICD-10-CM | POA: Diagnosis not present

## 2013-01-17 DIAGNOSIS — R279 Unspecified lack of coordination: Secondary | ICD-10-CM | POA: Diagnosis not present

## 2013-01-17 DIAGNOSIS — J189 Pneumonia, unspecified organism: Secondary | ICD-10-CM | POA: Diagnosis not present

## 2013-01-17 DIAGNOSIS — M2550 Pain in unspecified joint: Secondary | ICD-10-CM | POA: Diagnosis not present

## 2013-01-17 DIAGNOSIS — K225 Diverticulum of esophagus, acquired: Secondary | ICD-10-CM | POA: Diagnosis not present

## 2013-01-17 DIAGNOSIS — M069 Rheumatoid arthritis, unspecified: Secondary | ICD-10-CM | POA: Diagnosis not present

## 2013-01-17 DIAGNOSIS — R1312 Dysphagia, oropharyngeal phase: Secondary | ICD-10-CM | POA: Diagnosis not present

## 2013-01-18 DIAGNOSIS — J189 Pneumonia, unspecified organism: Secondary | ICD-10-CM | POA: Diagnosis not present

## 2013-01-18 DIAGNOSIS — M2550 Pain in unspecified joint: Secondary | ICD-10-CM | POA: Diagnosis not present

## 2013-01-18 DIAGNOSIS — R1312 Dysphagia, oropharyngeal phase: Secondary | ICD-10-CM | POA: Diagnosis not present

## 2013-01-18 DIAGNOSIS — K225 Diverticulum of esophagus, acquired: Secondary | ICD-10-CM | POA: Diagnosis not present

## 2013-01-18 DIAGNOSIS — R279 Unspecified lack of coordination: Secondary | ICD-10-CM | POA: Diagnosis not present

## 2013-01-18 DIAGNOSIS — M069 Rheumatoid arthritis, unspecified: Secondary | ICD-10-CM | POA: Diagnosis not present

## 2013-01-21 DIAGNOSIS — J189 Pneumonia, unspecified organism: Secondary | ICD-10-CM | POA: Diagnosis not present

## 2013-01-21 DIAGNOSIS — R1312 Dysphagia, oropharyngeal phase: Secondary | ICD-10-CM | POA: Diagnosis not present

## 2013-01-21 DIAGNOSIS — M069 Rheumatoid arthritis, unspecified: Secondary | ICD-10-CM | POA: Diagnosis not present

## 2013-01-21 DIAGNOSIS — K225 Diverticulum of esophagus, acquired: Secondary | ICD-10-CM | POA: Diagnosis not present

## 2013-01-21 DIAGNOSIS — R279 Unspecified lack of coordination: Secondary | ICD-10-CM | POA: Diagnosis not present

## 2013-01-21 DIAGNOSIS — M2550 Pain in unspecified joint: Secondary | ICD-10-CM | POA: Diagnosis not present

## 2013-01-22 DIAGNOSIS — M069 Rheumatoid arthritis, unspecified: Secondary | ICD-10-CM | POA: Diagnosis not present

## 2013-01-22 DIAGNOSIS — R1312 Dysphagia, oropharyngeal phase: Secondary | ICD-10-CM | POA: Diagnosis not present

## 2013-01-22 DIAGNOSIS — K225 Diverticulum of esophagus, acquired: Secondary | ICD-10-CM | POA: Diagnosis not present

## 2013-01-22 DIAGNOSIS — J189 Pneumonia, unspecified organism: Secondary | ICD-10-CM | POA: Diagnosis not present

## 2013-01-22 DIAGNOSIS — M2550 Pain in unspecified joint: Secondary | ICD-10-CM | POA: Diagnosis not present

## 2013-01-22 DIAGNOSIS — R279 Unspecified lack of coordination: Secondary | ICD-10-CM | POA: Diagnosis not present

## 2013-01-23 DIAGNOSIS — R279 Unspecified lack of coordination: Secondary | ICD-10-CM | POA: Diagnosis not present

## 2013-01-23 DIAGNOSIS — R1312 Dysphagia, oropharyngeal phase: Secondary | ICD-10-CM | POA: Diagnosis not present

## 2013-01-23 DIAGNOSIS — M069 Rheumatoid arthritis, unspecified: Secondary | ICD-10-CM | POA: Diagnosis not present

## 2013-01-23 DIAGNOSIS — K225 Diverticulum of esophagus, acquired: Secondary | ICD-10-CM | POA: Diagnosis not present

## 2013-01-23 DIAGNOSIS — J189 Pneumonia, unspecified organism: Secondary | ICD-10-CM | POA: Diagnosis not present

## 2013-01-23 DIAGNOSIS — M2550 Pain in unspecified joint: Secondary | ICD-10-CM | POA: Diagnosis not present

## 2013-01-24 DIAGNOSIS — M069 Rheumatoid arthritis, unspecified: Secondary | ICD-10-CM | POA: Diagnosis not present

## 2013-01-24 DIAGNOSIS — K225 Diverticulum of esophagus, acquired: Secondary | ICD-10-CM | POA: Diagnosis not present

## 2013-01-24 DIAGNOSIS — J189 Pneumonia, unspecified organism: Secondary | ICD-10-CM | POA: Diagnosis not present

## 2013-01-24 DIAGNOSIS — R279 Unspecified lack of coordination: Secondary | ICD-10-CM | POA: Diagnosis not present

## 2013-01-24 DIAGNOSIS — R1312 Dysphagia, oropharyngeal phase: Secondary | ICD-10-CM | POA: Diagnosis not present

## 2013-01-24 DIAGNOSIS — M2550 Pain in unspecified joint: Secondary | ICD-10-CM | POA: Diagnosis not present

## 2013-01-25 DIAGNOSIS — R1312 Dysphagia, oropharyngeal phase: Secondary | ICD-10-CM | POA: Diagnosis not present

## 2013-01-25 DIAGNOSIS — J189 Pneumonia, unspecified organism: Secondary | ICD-10-CM | POA: Diagnosis not present

## 2013-01-25 DIAGNOSIS — K225 Diverticulum of esophagus, acquired: Secondary | ICD-10-CM | POA: Diagnosis not present

## 2013-01-25 DIAGNOSIS — M069 Rheumatoid arthritis, unspecified: Secondary | ICD-10-CM | POA: Diagnosis not present

## 2013-01-25 DIAGNOSIS — M2550 Pain in unspecified joint: Secondary | ICD-10-CM | POA: Diagnosis not present

## 2013-01-25 DIAGNOSIS — R279 Unspecified lack of coordination: Secondary | ICD-10-CM | POA: Diagnosis not present

## 2013-01-27 DIAGNOSIS — R1319 Other dysphagia: Secondary | ICD-10-CM | POA: Diagnosis not present

## 2013-01-28 DIAGNOSIS — R279 Unspecified lack of coordination: Secondary | ICD-10-CM | POA: Diagnosis not present

## 2013-01-28 DIAGNOSIS — J189 Pneumonia, unspecified organism: Secondary | ICD-10-CM | POA: Diagnosis not present

## 2013-01-28 DIAGNOSIS — M2550 Pain in unspecified joint: Secondary | ICD-10-CM | POA: Diagnosis not present

## 2013-01-28 DIAGNOSIS — K225 Diverticulum of esophagus, acquired: Secondary | ICD-10-CM | POA: Diagnosis not present

## 2013-01-28 DIAGNOSIS — R1312 Dysphagia, oropharyngeal phase: Secondary | ICD-10-CM | POA: Diagnosis not present

## 2013-01-28 DIAGNOSIS — M069 Rheumatoid arthritis, unspecified: Secondary | ICD-10-CM | POA: Diagnosis not present

## 2013-01-29 DIAGNOSIS — K225 Diverticulum of esophagus, acquired: Secondary | ICD-10-CM | POA: Diagnosis not present

## 2013-01-29 DIAGNOSIS — J189 Pneumonia, unspecified organism: Secondary | ICD-10-CM | POA: Diagnosis not present

## 2013-01-29 DIAGNOSIS — R279 Unspecified lack of coordination: Secondary | ICD-10-CM | POA: Diagnosis not present

## 2013-01-29 DIAGNOSIS — M2550 Pain in unspecified joint: Secondary | ICD-10-CM | POA: Diagnosis not present

## 2013-01-29 DIAGNOSIS — M069 Rheumatoid arthritis, unspecified: Secondary | ICD-10-CM | POA: Diagnosis not present

## 2013-01-29 DIAGNOSIS — R1312 Dysphagia, oropharyngeal phase: Secondary | ICD-10-CM | POA: Diagnosis not present

## 2013-01-30 DIAGNOSIS — R1312 Dysphagia, oropharyngeal phase: Secondary | ICD-10-CM | POA: Diagnosis not present

## 2013-01-30 DIAGNOSIS — J189 Pneumonia, unspecified organism: Secondary | ICD-10-CM | POA: Diagnosis not present

## 2013-01-30 DIAGNOSIS — M069 Rheumatoid arthritis, unspecified: Secondary | ICD-10-CM | POA: Diagnosis not present

## 2013-01-30 DIAGNOSIS — M25549 Pain in joints of unspecified hand: Secondary | ICD-10-CM | POA: Diagnosis not present

## 2013-01-30 DIAGNOSIS — K225 Diverticulum of esophagus, acquired: Secondary | ICD-10-CM | POA: Diagnosis not present

## 2013-01-30 DIAGNOSIS — M2550 Pain in unspecified joint: Secondary | ICD-10-CM | POA: Diagnosis not present

## 2013-01-30 DIAGNOSIS — N390 Urinary tract infection, site not specified: Secondary | ICD-10-CM | POA: Diagnosis not present

## 2013-01-30 DIAGNOSIS — R279 Unspecified lack of coordination: Secondary | ICD-10-CM | POA: Diagnosis not present

## 2013-01-31 DIAGNOSIS — M069 Rheumatoid arthritis, unspecified: Secondary | ICD-10-CM | POA: Diagnosis not present

## 2013-01-31 DIAGNOSIS — R1312 Dysphagia, oropharyngeal phase: Secondary | ICD-10-CM | POA: Diagnosis not present

## 2013-01-31 DIAGNOSIS — K225 Diverticulum of esophagus, acquired: Secondary | ICD-10-CM | POA: Diagnosis not present

## 2013-01-31 DIAGNOSIS — R279 Unspecified lack of coordination: Secondary | ICD-10-CM | POA: Diagnosis not present

## 2013-01-31 DIAGNOSIS — M2550 Pain in unspecified joint: Secondary | ICD-10-CM | POA: Diagnosis not present

## 2013-01-31 DIAGNOSIS — J189 Pneumonia, unspecified organism: Secondary | ICD-10-CM | POA: Diagnosis not present

## 2013-02-01 DIAGNOSIS — M2550 Pain in unspecified joint: Secondary | ICD-10-CM | POA: Diagnosis not present

## 2013-02-01 DIAGNOSIS — R279 Unspecified lack of coordination: Secondary | ICD-10-CM | POA: Diagnosis not present

## 2013-02-01 DIAGNOSIS — K225 Diverticulum of esophagus, acquired: Secondary | ICD-10-CM | POA: Diagnosis not present

## 2013-02-01 DIAGNOSIS — J189 Pneumonia, unspecified organism: Secondary | ICD-10-CM | POA: Diagnosis not present

## 2013-02-01 DIAGNOSIS — M069 Rheumatoid arthritis, unspecified: Secondary | ICD-10-CM | POA: Diagnosis not present

## 2013-02-01 DIAGNOSIS — M6281 Muscle weakness (generalized): Secondary | ICD-10-CM | POA: Diagnosis not present

## 2013-02-01 DIAGNOSIS — I259 Chronic ischemic heart disease, unspecified: Secondary | ICD-10-CM | POA: Diagnosis not present

## 2013-02-01 DIAGNOSIS — R269 Unspecified abnormalities of gait and mobility: Secondary | ICD-10-CM | POA: Diagnosis not present

## 2013-02-02 DIAGNOSIS — K225 Diverticulum of esophagus, acquired: Secondary | ICD-10-CM | POA: Diagnosis not present

## 2013-02-02 DIAGNOSIS — I259 Chronic ischemic heart disease, unspecified: Secondary | ICD-10-CM | POA: Diagnosis not present

## 2013-02-02 DIAGNOSIS — R279 Unspecified lack of coordination: Secondary | ICD-10-CM | POA: Diagnosis not present

## 2013-02-02 DIAGNOSIS — J189 Pneumonia, unspecified organism: Secondary | ICD-10-CM | POA: Diagnosis not present

## 2013-02-02 DIAGNOSIS — M069 Rheumatoid arthritis, unspecified: Secondary | ICD-10-CM | POA: Diagnosis not present

## 2013-02-02 DIAGNOSIS — M2550 Pain in unspecified joint: Secondary | ICD-10-CM | POA: Diagnosis not present

## 2013-02-03 DIAGNOSIS — R279 Unspecified lack of coordination: Secondary | ICD-10-CM | POA: Diagnosis not present

## 2013-02-03 DIAGNOSIS — I259 Chronic ischemic heart disease, unspecified: Secondary | ICD-10-CM | POA: Diagnosis not present

## 2013-02-03 DIAGNOSIS — K225 Diverticulum of esophagus, acquired: Secondary | ICD-10-CM | POA: Diagnosis not present

## 2013-02-03 DIAGNOSIS — J189 Pneumonia, unspecified organism: Secondary | ICD-10-CM | POA: Diagnosis not present

## 2013-02-03 DIAGNOSIS — M069 Rheumatoid arthritis, unspecified: Secondary | ICD-10-CM | POA: Diagnosis not present

## 2013-02-03 DIAGNOSIS — M2550 Pain in unspecified joint: Secondary | ICD-10-CM | POA: Diagnosis not present

## 2013-02-05 DIAGNOSIS — M79609 Pain in unspecified limb: Secondary | ICD-10-CM | POA: Diagnosis not present

## 2013-02-05 DIAGNOSIS — M2550 Pain in unspecified joint: Secondary | ICD-10-CM | POA: Diagnosis not present

## 2013-02-05 DIAGNOSIS — R279 Unspecified lack of coordination: Secondary | ICD-10-CM | POA: Diagnosis not present

## 2013-02-05 DIAGNOSIS — J189 Pneumonia, unspecified organism: Secondary | ICD-10-CM | POA: Diagnosis not present

## 2013-02-05 DIAGNOSIS — M069 Rheumatoid arthritis, unspecified: Secondary | ICD-10-CM | POA: Diagnosis not present

## 2013-02-05 DIAGNOSIS — I259 Chronic ischemic heart disease, unspecified: Secondary | ICD-10-CM | POA: Diagnosis not present

## 2013-02-05 DIAGNOSIS — K225 Diverticulum of esophagus, acquired: Secondary | ICD-10-CM | POA: Diagnosis not present

## 2013-02-05 DIAGNOSIS — B351 Tinea unguium: Secondary | ICD-10-CM | POA: Diagnosis not present

## 2013-02-06 DIAGNOSIS — K225 Diverticulum of esophagus, acquired: Secondary | ICD-10-CM | POA: Diagnosis not present

## 2013-02-06 DIAGNOSIS — I259 Chronic ischemic heart disease, unspecified: Secondary | ICD-10-CM | POA: Diagnosis not present

## 2013-02-06 DIAGNOSIS — M069 Rheumatoid arthritis, unspecified: Secondary | ICD-10-CM | POA: Diagnosis not present

## 2013-02-06 DIAGNOSIS — R279 Unspecified lack of coordination: Secondary | ICD-10-CM | POA: Diagnosis not present

## 2013-02-06 DIAGNOSIS — M2550 Pain in unspecified joint: Secondary | ICD-10-CM | POA: Diagnosis not present

## 2013-02-06 DIAGNOSIS — J189 Pneumonia, unspecified organism: Secondary | ICD-10-CM | POA: Diagnosis not present

## 2013-02-07 DIAGNOSIS — J189 Pneumonia, unspecified organism: Secondary | ICD-10-CM | POA: Diagnosis not present

## 2013-02-07 DIAGNOSIS — K225 Diverticulum of esophagus, acquired: Secondary | ICD-10-CM | POA: Diagnosis not present

## 2013-02-07 DIAGNOSIS — M2550 Pain in unspecified joint: Secondary | ICD-10-CM | POA: Diagnosis not present

## 2013-02-07 DIAGNOSIS — I259 Chronic ischemic heart disease, unspecified: Secondary | ICD-10-CM | POA: Diagnosis not present

## 2013-02-07 DIAGNOSIS — M069 Rheumatoid arthritis, unspecified: Secondary | ICD-10-CM | POA: Diagnosis not present

## 2013-02-07 DIAGNOSIS — R279 Unspecified lack of coordination: Secondary | ICD-10-CM | POA: Diagnosis not present

## 2013-02-08 DIAGNOSIS — M2550 Pain in unspecified joint: Secondary | ICD-10-CM | POA: Diagnosis not present

## 2013-02-08 DIAGNOSIS — R279 Unspecified lack of coordination: Secondary | ICD-10-CM | POA: Diagnosis not present

## 2013-02-08 DIAGNOSIS — I259 Chronic ischemic heart disease, unspecified: Secondary | ICD-10-CM | POA: Diagnosis not present

## 2013-02-08 DIAGNOSIS — M069 Rheumatoid arthritis, unspecified: Secondary | ICD-10-CM | POA: Diagnosis not present

## 2013-02-08 DIAGNOSIS — J189 Pneumonia, unspecified organism: Secondary | ICD-10-CM | POA: Diagnosis not present

## 2013-02-08 DIAGNOSIS — K225 Diverticulum of esophagus, acquired: Secondary | ICD-10-CM | POA: Diagnosis not present

## 2013-02-09 DIAGNOSIS — M069 Rheumatoid arthritis, unspecified: Secondary | ICD-10-CM | POA: Diagnosis not present

## 2013-02-09 DIAGNOSIS — J189 Pneumonia, unspecified organism: Secondary | ICD-10-CM | POA: Diagnosis not present

## 2013-02-09 DIAGNOSIS — I259 Chronic ischemic heart disease, unspecified: Secondary | ICD-10-CM | POA: Diagnosis not present

## 2013-02-09 DIAGNOSIS — R279 Unspecified lack of coordination: Secondary | ICD-10-CM | POA: Diagnosis not present

## 2013-02-09 DIAGNOSIS — M2550 Pain in unspecified joint: Secondary | ICD-10-CM | POA: Diagnosis not present

## 2013-02-09 DIAGNOSIS — K225 Diverticulum of esophagus, acquired: Secondary | ICD-10-CM | POA: Diagnosis not present

## 2013-02-10 DIAGNOSIS — M2550 Pain in unspecified joint: Secondary | ICD-10-CM | POA: Diagnosis not present

## 2013-02-10 DIAGNOSIS — J189 Pneumonia, unspecified organism: Secondary | ICD-10-CM | POA: Diagnosis not present

## 2013-02-10 DIAGNOSIS — M069 Rheumatoid arthritis, unspecified: Secondary | ICD-10-CM | POA: Diagnosis not present

## 2013-02-10 DIAGNOSIS — I259 Chronic ischemic heart disease, unspecified: Secondary | ICD-10-CM | POA: Diagnosis not present

## 2013-02-10 DIAGNOSIS — R279 Unspecified lack of coordination: Secondary | ICD-10-CM | POA: Diagnosis not present

## 2013-02-10 DIAGNOSIS — K225 Diverticulum of esophagus, acquired: Secondary | ICD-10-CM | POA: Diagnosis not present

## 2013-02-11 DIAGNOSIS — M069 Rheumatoid arthritis, unspecified: Secondary | ICD-10-CM | POA: Diagnosis not present

## 2013-02-11 DIAGNOSIS — M2550 Pain in unspecified joint: Secondary | ICD-10-CM | POA: Diagnosis not present

## 2013-02-11 DIAGNOSIS — I259 Chronic ischemic heart disease, unspecified: Secondary | ICD-10-CM | POA: Diagnosis not present

## 2013-02-11 DIAGNOSIS — J189 Pneumonia, unspecified organism: Secondary | ICD-10-CM | POA: Diagnosis not present

## 2013-02-11 DIAGNOSIS — R279 Unspecified lack of coordination: Secondary | ICD-10-CM | POA: Diagnosis not present

## 2013-02-11 DIAGNOSIS — K225 Diverticulum of esophagus, acquired: Secondary | ICD-10-CM | POA: Diagnosis not present

## 2013-02-12 DIAGNOSIS — M2550 Pain in unspecified joint: Secondary | ICD-10-CM | POA: Diagnosis not present

## 2013-02-12 DIAGNOSIS — R279 Unspecified lack of coordination: Secondary | ICD-10-CM | POA: Diagnosis not present

## 2013-02-12 DIAGNOSIS — K225 Diverticulum of esophagus, acquired: Secondary | ICD-10-CM | POA: Diagnosis not present

## 2013-02-12 DIAGNOSIS — J189 Pneumonia, unspecified organism: Secondary | ICD-10-CM | POA: Diagnosis not present

## 2013-02-12 DIAGNOSIS — M069 Rheumatoid arthritis, unspecified: Secondary | ICD-10-CM | POA: Diagnosis not present

## 2013-02-12 DIAGNOSIS — I259 Chronic ischemic heart disease, unspecified: Secondary | ICD-10-CM | POA: Diagnosis not present

## 2013-02-13 DIAGNOSIS — K8050 Calculus of bile duct without cholangitis or cholecystitis without obstruction: Secondary | ICD-10-CM | POA: Diagnosis not present

## 2013-02-13 DIAGNOSIS — Z7982 Long term (current) use of aspirin: Secondary | ICD-10-CM | POA: Diagnosis not present

## 2013-02-13 DIAGNOSIS — Z888 Allergy status to other drugs, medicaments and biological substances status: Secondary | ICD-10-CM | POA: Diagnosis not present

## 2013-02-13 DIAGNOSIS — I10 Essential (primary) hypertension: Secondary | ICD-10-CM | POA: Diagnosis not present

## 2013-02-13 DIAGNOSIS — J45909 Unspecified asthma, uncomplicated: Secondary | ICD-10-CM | POA: Diagnosis not present

## 2013-02-13 DIAGNOSIS — K8309 Other cholangitis: Secondary | ICD-10-CM | POA: Diagnosis not present

## 2013-02-13 DIAGNOSIS — K5710 Diverticulosis of small intestine without perforation or abscess without bleeding: Secondary | ICD-10-CM | POA: Diagnosis not present

## 2013-02-13 DIAGNOSIS — F329 Major depressive disorder, single episode, unspecified: Secondary | ICD-10-CM | POA: Diagnosis not present

## 2013-02-13 DIAGNOSIS — M129 Arthropathy, unspecified: Secondary | ICD-10-CM | POA: Diagnosis not present

## 2013-02-13 DIAGNOSIS — Z880 Allergy status to penicillin: Secondary | ICD-10-CM | POA: Diagnosis not present

## 2013-02-13 DIAGNOSIS — I252 Old myocardial infarction: Secondary | ICD-10-CM | POA: Diagnosis not present

## 2013-02-13 DIAGNOSIS — F3289 Other specified depressive episodes: Secondary | ICD-10-CM | POA: Diagnosis not present

## 2013-02-13 DIAGNOSIS — Z859 Personal history of malignant neoplasm, unspecified: Secondary | ICD-10-CM | POA: Diagnosis not present

## 2013-02-13 DIAGNOSIS — Z885 Allergy status to narcotic agent status: Secondary | ICD-10-CM | POA: Diagnosis not present

## 2013-02-13 DIAGNOSIS — E785 Hyperlipidemia, unspecified: Secondary | ICD-10-CM | POA: Diagnosis not present

## 2013-02-13 DIAGNOSIS — M899 Disorder of bone, unspecified: Secondary | ICD-10-CM | POA: Diagnosis not present

## 2013-02-13 DIAGNOSIS — F411 Generalized anxiety disorder: Secondary | ICD-10-CM | POA: Diagnosis not present

## 2013-02-13 DIAGNOSIS — Z886 Allergy status to analgesic agent status: Secondary | ICD-10-CM | POA: Diagnosis not present

## 2013-02-13 DIAGNOSIS — I2510 Atherosclerotic heart disease of native coronary artery without angina pectoris: Secondary | ICD-10-CM | POA: Diagnosis not present

## 2013-02-13 DIAGNOSIS — K449 Diaphragmatic hernia without obstruction or gangrene: Secondary | ICD-10-CM | POA: Diagnosis not present

## 2013-02-14 DIAGNOSIS — I259 Chronic ischemic heart disease, unspecified: Secondary | ICD-10-CM | POA: Diagnosis not present

## 2013-02-14 DIAGNOSIS — R279 Unspecified lack of coordination: Secondary | ICD-10-CM | POA: Diagnosis not present

## 2013-02-14 DIAGNOSIS — M2550 Pain in unspecified joint: Secondary | ICD-10-CM | POA: Diagnosis not present

## 2013-02-14 DIAGNOSIS — M069 Rheumatoid arthritis, unspecified: Secondary | ICD-10-CM | POA: Diagnosis not present

## 2013-02-14 DIAGNOSIS — J189 Pneumonia, unspecified organism: Secondary | ICD-10-CM | POA: Diagnosis not present

## 2013-02-14 DIAGNOSIS — K225 Diverticulum of esophagus, acquired: Secondary | ICD-10-CM | POA: Diagnosis not present

## 2013-02-15 DIAGNOSIS — M069 Rheumatoid arthritis, unspecified: Secondary | ICD-10-CM | POA: Diagnosis not present

## 2013-02-15 DIAGNOSIS — M2550 Pain in unspecified joint: Secondary | ICD-10-CM | POA: Diagnosis not present

## 2013-02-15 DIAGNOSIS — I259 Chronic ischemic heart disease, unspecified: Secondary | ICD-10-CM | POA: Diagnosis not present

## 2013-02-15 DIAGNOSIS — R279 Unspecified lack of coordination: Secondary | ICD-10-CM | POA: Diagnosis not present

## 2013-02-15 DIAGNOSIS — K225 Diverticulum of esophagus, acquired: Secondary | ICD-10-CM | POA: Diagnosis not present

## 2013-02-15 DIAGNOSIS — J189 Pneumonia, unspecified organism: Secondary | ICD-10-CM | POA: Diagnosis not present

## 2013-02-18 DIAGNOSIS — K225 Diverticulum of esophagus, acquired: Secondary | ICD-10-CM | POA: Diagnosis not present

## 2013-02-18 DIAGNOSIS — J189 Pneumonia, unspecified organism: Secondary | ICD-10-CM | POA: Diagnosis not present

## 2013-02-18 DIAGNOSIS — F329 Major depressive disorder, single episode, unspecified: Secondary | ICD-10-CM | POA: Diagnosis not present

## 2013-02-18 DIAGNOSIS — M2550 Pain in unspecified joint: Secondary | ICD-10-CM | POA: Diagnosis not present

## 2013-02-18 DIAGNOSIS — M069 Rheumatoid arthritis, unspecified: Secondary | ICD-10-CM | POA: Diagnosis not present

## 2013-02-18 DIAGNOSIS — R279 Unspecified lack of coordination: Secondary | ICD-10-CM | POA: Diagnosis not present

## 2013-02-18 DIAGNOSIS — I259 Chronic ischemic heart disease, unspecified: Secondary | ICD-10-CM | POA: Diagnosis not present

## 2013-02-19 DIAGNOSIS — K225 Diverticulum of esophagus, acquired: Secondary | ICD-10-CM | POA: Diagnosis not present

## 2013-02-19 DIAGNOSIS — M069 Rheumatoid arthritis, unspecified: Secondary | ICD-10-CM | POA: Diagnosis not present

## 2013-02-19 DIAGNOSIS — M2550 Pain in unspecified joint: Secondary | ICD-10-CM | POA: Diagnosis not present

## 2013-02-19 DIAGNOSIS — I259 Chronic ischemic heart disease, unspecified: Secondary | ICD-10-CM | POA: Diagnosis not present

## 2013-02-19 DIAGNOSIS — J189 Pneumonia, unspecified organism: Secondary | ICD-10-CM | POA: Diagnosis not present

## 2013-02-19 DIAGNOSIS — R279 Unspecified lack of coordination: Secondary | ICD-10-CM | POA: Diagnosis not present

## 2013-02-20 DIAGNOSIS — I259 Chronic ischemic heart disease, unspecified: Secondary | ICD-10-CM | POA: Diagnosis not present

## 2013-02-20 DIAGNOSIS — K225 Diverticulum of esophagus, acquired: Secondary | ICD-10-CM | POA: Diagnosis not present

## 2013-02-20 DIAGNOSIS — M2550 Pain in unspecified joint: Secondary | ICD-10-CM | POA: Diagnosis not present

## 2013-02-20 DIAGNOSIS — J189 Pneumonia, unspecified organism: Secondary | ICD-10-CM | POA: Diagnosis not present

## 2013-02-20 DIAGNOSIS — R279 Unspecified lack of coordination: Secondary | ICD-10-CM | POA: Diagnosis not present

## 2013-02-20 DIAGNOSIS — F329 Major depressive disorder, single episode, unspecified: Secondary | ICD-10-CM | POA: Diagnosis not present

## 2013-02-20 DIAGNOSIS — M069 Rheumatoid arthritis, unspecified: Secondary | ICD-10-CM | POA: Diagnosis not present

## 2013-02-21 DIAGNOSIS — M2550 Pain in unspecified joint: Secondary | ICD-10-CM | POA: Diagnosis not present

## 2013-02-21 DIAGNOSIS — K225 Diverticulum of esophagus, acquired: Secondary | ICD-10-CM | POA: Diagnosis not present

## 2013-02-21 DIAGNOSIS — M069 Rheumatoid arthritis, unspecified: Secondary | ICD-10-CM | POA: Diagnosis not present

## 2013-02-21 DIAGNOSIS — R279 Unspecified lack of coordination: Secondary | ICD-10-CM | POA: Diagnosis not present

## 2013-02-21 DIAGNOSIS — J189 Pneumonia, unspecified organism: Secondary | ICD-10-CM | POA: Diagnosis not present

## 2013-02-21 DIAGNOSIS — I259 Chronic ischemic heart disease, unspecified: Secondary | ICD-10-CM | POA: Diagnosis not present

## 2013-02-22 DIAGNOSIS — M2550 Pain in unspecified joint: Secondary | ICD-10-CM | POA: Diagnosis not present

## 2013-02-22 DIAGNOSIS — J189 Pneumonia, unspecified organism: Secondary | ICD-10-CM | POA: Diagnosis not present

## 2013-02-22 DIAGNOSIS — I259 Chronic ischemic heart disease, unspecified: Secondary | ICD-10-CM | POA: Diagnosis not present

## 2013-02-22 DIAGNOSIS — K225 Diverticulum of esophagus, acquired: Secondary | ICD-10-CM | POA: Diagnosis not present

## 2013-02-22 DIAGNOSIS — M069 Rheumatoid arthritis, unspecified: Secondary | ICD-10-CM | POA: Diagnosis not present

## 2013-02-22 DIAGNOSIS — R279 Unspecified lack of coordination: Secondary | ICD-10-CM | POA: Diagnosis not present

## 2013-02-25 DIAGNOSIS — M2550 Pain in unspecified joint: Secondary | ICD-10-CM | POA: Diagnosis not present

## 2013-02-25 DIAGNOSIS — K225 Diverticulum of esophagus, acquired: Secondary | ICD-10-CM | POA: Diagnosis not present

## 2013-02-25 DIAGNOSIS — R279 Unspecified lack of coordination: Secondary | ICD-10-CM | POA: Diagnosis not present

## 2013-02-25 DIAGNOSIS — M069 Rheumatoid arthritis, unspecified: Secondary | ICD-10-CM | POA: Diagnosis not present

## 2013-02-25 DIAGNOSIS — J189 Pneumonia, unspecified organism: Secondary | ICD-10-CM | POA: Diagnosis not present

## 2013-02-25 DIAGNOSIS — I259 Chronic ischemic heart disease, unspecified: Secondary | ICD-10-CM | POA: Diagnosis not present

## 2013-02-26 DIAGNOSIS — I259 Chronic ischemic heart disease, unspecified: Secondary | ICD-10-CM | POA: Diagnosis not present

## 2013-02-26 DIAGNOSIS — R279 Unspecified lack of coordination: Secondary | ICD-10-CM | POA: Diagnosis not present

## 2013-02-26 DIAGNOSIS — M2550 Pain in unspecified joint: Secondary | ICD-10-CM | POA: Diagnosis not present

## 2013-02-26 DIAGNOSIS — M069 Rheumatoid arthritis, unspecified: Secondary | ICD-10-CM | POA: Diagnosis not present

## 2013-02-26 DIAGNOSIS — K225 Diverticulum of esophagus, acquired: Secondary | ICD-10-CM | POA: Diagnosis not present

## 2013-02-26 DIAGNOSIS — J189 Pneumonia, unspecified organism: Secondary | ICD-10-CM | POA: Diagnosis not present

## 2013-02-27 DIAGNOSIS — I259 Chronic ischemic heart disease, unspecified: Secondary | ICD-10-CM | POA: Diagnosis not present

## 2013-02-27 DIAGNOSIS — K225 Diverticulum of esophagus, acquired: Secondary | ICD-10-CM | POA: Diagnosis not present

## 2013-02-27 DIAGNOSIS — M2550 Pain in unspecified joint: Secondary | ICD-10-CM | POA: Diagnosis not present

## 2013-02-27 DIAGNOSIS — F329 Major depressive disorder, single episode, unspecified: Secondary | ICD-10-CM | POA: Diagnosis not present

## 2013-02-27 DIAGNOSIS — M069 Rheumatoid arthritis, unspecified: Secondary | ICD-10-CM | POA: Diagnosis not present

## 2013-02-27 DIAGNOSIS — R279 Unspecified lack of coordination: Secondary | ICD-10-CM | POA: Diagnosis not present

## 2013-02-27 DIAGNOSIS — J189 Pneumonia, unspecified organism: Secondary | ICD-10-CM | POA: Diagnosis not present

## 2013-02-28 DIAGNOSIS — K225 Diverticulum of esophagus, acquired: Secondary | ICD-10-CM | POA: Diagnosis not present

## 2013-02-28 DIAGNOSIS — J189 Pneumonia, unspecified organism: Secondary | ICD-10-CM | POA: Diagnosis not present

## 2013-02-28 DIAGNOSIS — M2550 Pain in unspecified joint: Secondary | ICD-10-CM | POA: Diagnosis not present

## 2013-02-28 DIAGNOSIS — M069 Rheumatoid arthritis, unspecified: Secondary | ICD-10-CM | POA: Diagnosis not present

## 2013-02-28 DIAGNOSIS — I259 Chronic ischemic heart disease, unspecified: Secondary | ICD-10-CM | POA: Diagnosis not present

## 2013-02-28 DIAGNOSIS — R279 Unspecified lack of coordination: Secondary | ICD-10-CM | POA: Diagnosis not present

## 2013-03-01 DIAGNOSIS — K225 Diverticulum of esophagus, acquired: Secondary | ICD-10-CM | POA: Diagnosis not present

## 2013-03-01 DIAGNOSIS — M069 Rheumatoid arthritis, unspecified: Secondary | ICD-10-CM | POA: Diagnosis not present

## 2013-03-01 DIAGNOSIS — M2550 Pain in unspecified joint: Secondary | ICD-10-CM | POA: Diagnosis not present

## 2013-03-01 DIAGNOSIS — J189 Pneumonia, unspecified organism: Secondary | ICD-10-CM | POA: Diagnosis not present

## 2013-03-01 DIAGNOSIS — R279 Unspecified lack of coordination: Secondary | ICD-10-CM | POA: Diagnosis not present

## 2013-03-01 DIAGNOSIS — I259 Chronic ischemic heart disease, unspecified: Secondary | ICD-10-CM | POA: Diagnosis not present

## 2013-03-02 DIAGNOSIS — I259 Chronic ischemic heart disease, unspecified: Secondary | ICD-10-CM | POA: Diagnosis not present

## 2013-03-02 DIAGNOSIS — R279 Unspecified lack of coordination: Secondary | ICD-10-CM | POA: Diagnosis not present

## 2013-03-02 DIAGNOSIS — M2550 Pain in unspecified joint: Secondary | ICD-10-CM | POA: Diagnosis not present

## 2013-03-02 DIAGNOSIS — K225 Diverticulum of esophagus, acquired: Secondary | ICD-10-CM | POA: Diagnosis not present

## 2013-03-02 DIAGNOSIS — J189 Pneumonia, unspecified organism: Secondary | ICD-10-CM | POA: Diagnosis not present

## 2013-03-02 DIAGNOSIS — M069 Rheumatoid arthritis, unspecified: Secondary | ICD-10-CM | POA: Diagnosis not present

## 2013-03-04 DIAGNOSIS — J189 Pneumonia, unspecified organism: Secondary | ICD-10-CM | POA: Diagnosis not present

## 2013-03-04 DIAGNOSIS — E46 Unspecified protein-calorie malnutrition: Secondary | ICD-10-CM | POA: Diagnosis not present

## 2013-03-04 DIAGNOSIS — R269 Unspecified abnormalities of gait and mobility: Secondary | ICD-10-CM | POA: Diagnosis not present

## 2013-03-04 DIAGNOSIS — F329 Major depressive disorder, single episode, unspecified: Secondary | ICD-10-CM | POA: Diagnosis not present

## 2013-03-04 DIAGNOSIS — M069 Rheumatoid arthritis, unspecified: Secondary | ICD-10-CM | POA: Diagnosis not present

## 2013-03-04 DIAGNOSIS — R279 Unspecified lack of coordination: Secondary | ICD-10-CM | POA: Diagnosis not present

## 2013-03-04 DIAGNOSIS — K225 Diverticulum of esophagus, acquired: Secondary | ICD-10-CM | POA: Diagnosis not present

## 2013-03-04 DIAGNOSIS — I259 Chronic ischemic heart disease, unspecified: Secondary | ICD-10-CM | POA: Diagnosis not present

## 2013-03-04 DIAGNOSIS — M2550 Pain in unspecified joint: Secondary | ICD-10-CM | POA: Diagnosis not present

## 2013-03-04 DIAGNOSIS — R1312 Dysphagia, oropharyngeal phase: Secondary | ICD-10-CM | POA: Diagnosis not present

## 2013-03-05 DIAGNOSIS — E46 Unspecified protein-calorie malnutrition: Secondary | ICD-10-CM | POA: Diagnosis not present

## 2013-03-05 DIAGNOSIS — R279 Unspecified lack of coordination: Secondary | ICD-10-CM | POA: Diagnosis not present

## 2013-03-05 DIAGNOSIS — K225 Diverticulum of esophagus, acquired: Secondary | ICD-10-CM | POA: Diagnosis not present

## 2013-03-05 DIAGNOSIS — J189 Pneumonia, unspecified organism: Secondary | ICD-10-CM | POA: Diagnosis not present

## 2013-03-05 DIAGNOSIS — I259 Chronic ischemic heart disease, unspecified: Secondary | ICD-10-CM | POA: Diagnosis not present

## 2013-03-05 DIAGNOSIS — M069 Rheumatoid arthritis, unspecified: Secondary | ICD-10-CM | POA: Diagnosis not present

## 2013-03-06 DIAGNOSIS — E46 Unspecified protein-calorie malnutrition: Secondary | ICD-10-CM | POA: Diagnosis not present

## 2013-03-06 DIAGNOSIS — K225 Diverticulum of esophagus, acquired: Secondary | ICD-10-CM | POA: Diagnosis not present

## 2013-03-06 DIAGNOSIS — I259 Chronic ischemic heart disease, unspecified: Secondary | ICD-10-CM | POA: Diagnosis not present

## 2013-03-06 DIAGNOSIS — J189 Pneumonia, unspecified organism: Secondary | ICD-10-CM | POA: Diagnosis not present

## 2013-03-06 DIAGNOSIS — M069 Rheumatoid arthritis, unspecified: Secondary | ICD-10-CM | POA: Diagnosis not present

## 2013-03-06 DIAGNOSIS — F329 Major depressive disorder, single episode, unspecified: Secondary | ICD-10-CM | POA: Diagnosis not present

## 2013-03-06 DIAGNOSIS — R279 Unspecified lack of coordination: Secondary | ICD-10-CM | POA: Diagnosis not present

## 2013-03-07 DIAGNOSIS — J189 Pneumonia, unspecified organism: Secondary | ICD-10-CM | POA: Diagnosis not present

## 2013-03-07 DIAGNOSIS — M069 Rheumatoid arthritis, unspecified: Secondary | ICD-10-CM | POA: Diagnosis not present

## 2013-03-07 DIAGNOSIS — E46 Unspecified protein-calorie malnutrition: Secondary | ICD-10-CM | POA: Diagnosis not present

## 2013-03-07 DIAGNOSIS — R279 Unspecified lack of coordination: Secondary | ICD-10-CM | POA: Diagnosis not present

## 2013-03-07 DIAGNOSIS — K225 Diverticulum of esophagus, acquired: Secondary | ICD-10-CM | POA: Diagnosis not present

## 2013-03-07 DIAGNOSIS — I259 Chronic ischemic heart disease, unspecified: Secondary | ICD-10-CM | POA: Diagnosis not present

## 2013-03-08 DIAGNOSIS — E46 Unspecified protein-calorie malnutrition: Secondary | ICD-10-CM | POA: Diagnosis not present

## 2013-03-08 DIAGNOSIS — J189 Pneumonia, unspecified organism: Secondary | ICD-10-CM | POA: Diagnosis not present

## 2013-03-08 DIAGNOSIS — K225 Diverticulum of esophagus, acquired: Secondary | ICD-10-CM | POA: Diagnosis not present

## 2013-03-08 DIAGNOSIS — I259 Chronic ischemic heart disease, unspecified: Secondary | ICD-10-CM | POA: Diagnosis not present

## 2013-03-08 DIAGNOSIS — R279 Unspecified lack of coordination: Secondary | ICD-10-CM | POA: Diagnosis not present

## 2013-03-08 DIAGNOSIS — M069 Rheumatoid arthritis, unspecified: Secondary | ICD-10-CM | POA: Diagnosis not present

## 2013-03-11 DIAGNOSIS — K225 Diverticulum of esophagus, acquired: Secondary | ICD-10-CM | POA: Diagnosis not present

## 2013-03-11 DIAGNOSIS — M069 Rheumatoid arthritis, unspecified: Secondary | ICD-10-CM | POA: Diagnosis not present

## 2013-03-11 DIAGNOSIS — F329 Major depressive disorder, single episode, unspecified: Secondary | ICD-10-CM | POA: Diagnosis not present

## 2013-03-11 DIAGNOSIS — E46 Unspecified protein-calorie malnutrition: Secondary | ICD-10-CM | POA: Diagnosis not present

## 2013-03-11 DIAGNOSIS — I259 Chronic ischemic heart disease, unspecified: Secondary | ICD-10-CM | POA: Diagnosis not present

## 2013-03-11 DIAGNOSIS — J189 Pneumonia, unspecified organism: Secondary | ICD-10-CM | POA: Diagnosis not present

## 2013-03-11 DIAGNOSIS — R279 Unspecified lack of coordination: Secondary | ICD-10-CM | POA: Diagnosis not present

## 2013-03-12 DIAGNOSIS — E46 Unspecified protein-calorie malnutrition: Secondary | ICD-10-CM | POA: Diagnosis not present

## 2013-03-12 DIAGNOSIS — R279 Unspecified lack of coordination: Secondary | ICD-10-CM | POA: Diagnosis not present

## 2013-03-12 DIAGNOSIS — K225 Diverticulum of esophagus, acquired: Secondary | ICD-10-CM | POA: Diagnosis not present

## 2013-03-12 DIAGNOSIS — J189 Pneumonia, unspecified organism: Secondary | ICD-10-CM | POA: Diagnosis not present

## 2013-03-12 DIAGNOSIS — M069 Rheumatoid arthritis, unspecified: Secondary | ICD-10-CM | POA: Diagnosis not present

## 2013-03-12 DIAGNOSIS — I259 Chronic ischemic heart disease, unspecified: Secondary | ICD-10-CM | POA: Diagnosis not present

## 2013-03-13 DIAGNOSIS — F329 Major depressive disorder, single episode, unspecified: Secondary | ICD-10-CM | POA: Diagnosis not present

## 2013-03-20 DIAGNOSIS — F329 Major depressive disorder, single episode, unspecified: Secondary | ICD-10-CM | POA: Diagnosis not present

## 2013-03-24 DIAGNOSIS — J40 Bronchitis, not specified as acute or chronic: Secondary | ICD-10-CM | POA: Diagnosis not present

## 2013-03-25 DIAGNOSIS — F329 Major depressive disorder, single episode, unspecified: Secondary | ICD-10-CM | POA: Diagnosis not present

## 2013-03-27 DIAGNOSIS — F329 Major depressive disorder, single episode, unspecified: Secondary | ICD-10-CM | POA: Diagnosis not present

## 2013-03-29 IMAGING — CR DG ABDOMEN ACUTE W/ 1V CHEST
3 series · 3 of 3 positions shown · non-contrast
Comparison: 08/05/2009

CLINICAL DATA: Vomiting for 5 days.  Hysterectomy.

ACUTE ABDOMEN SERIES (ABDOMEN 2 VIEW & CHEST 1 VIEW)

[view not recorded (1 of 3)]
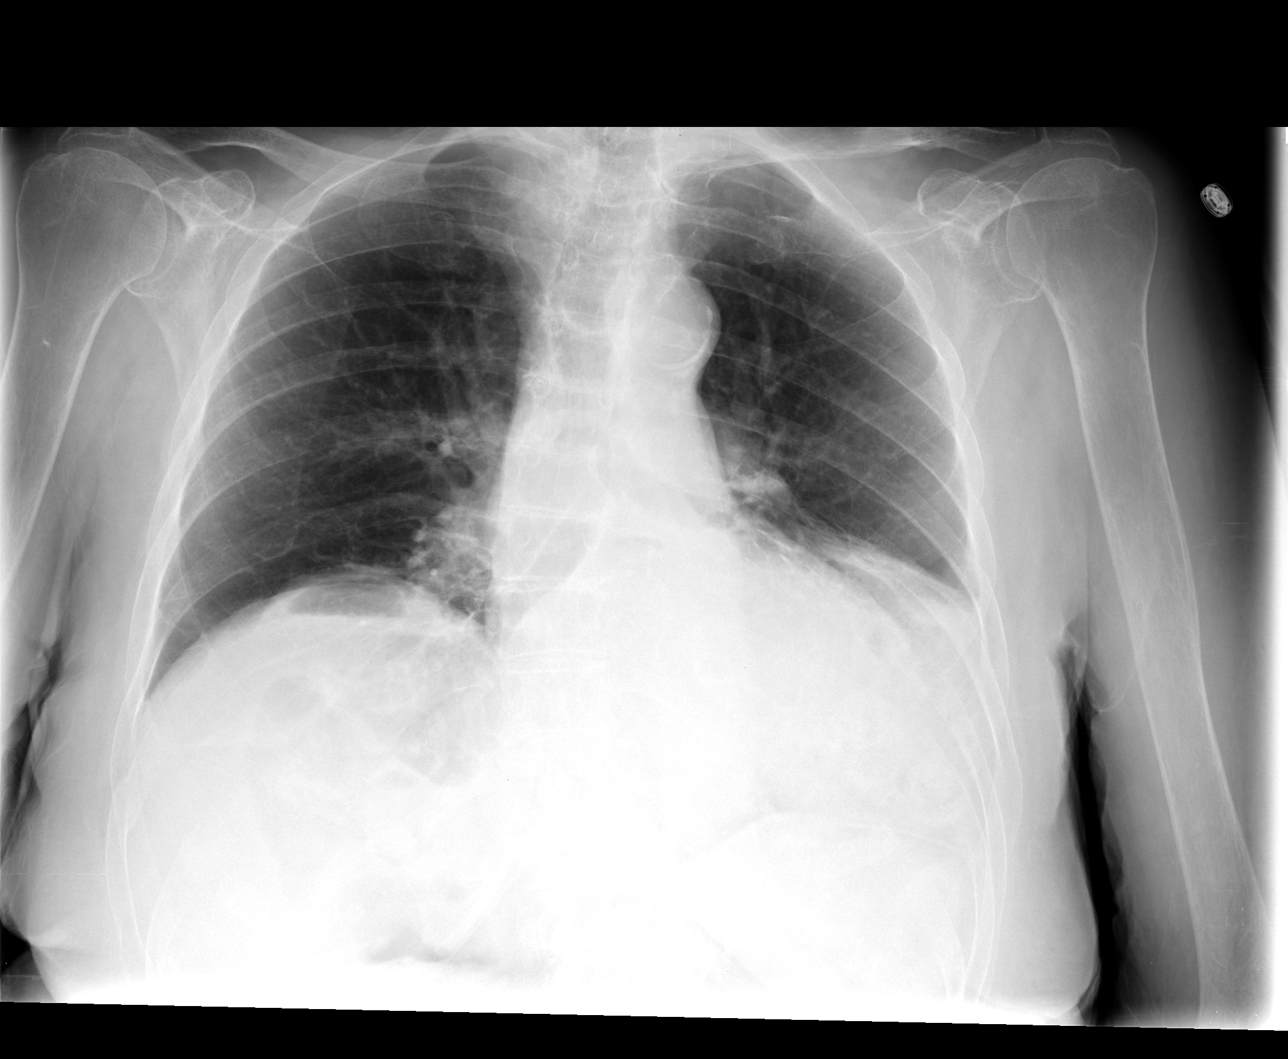

[view not recorded (2 of 3)]
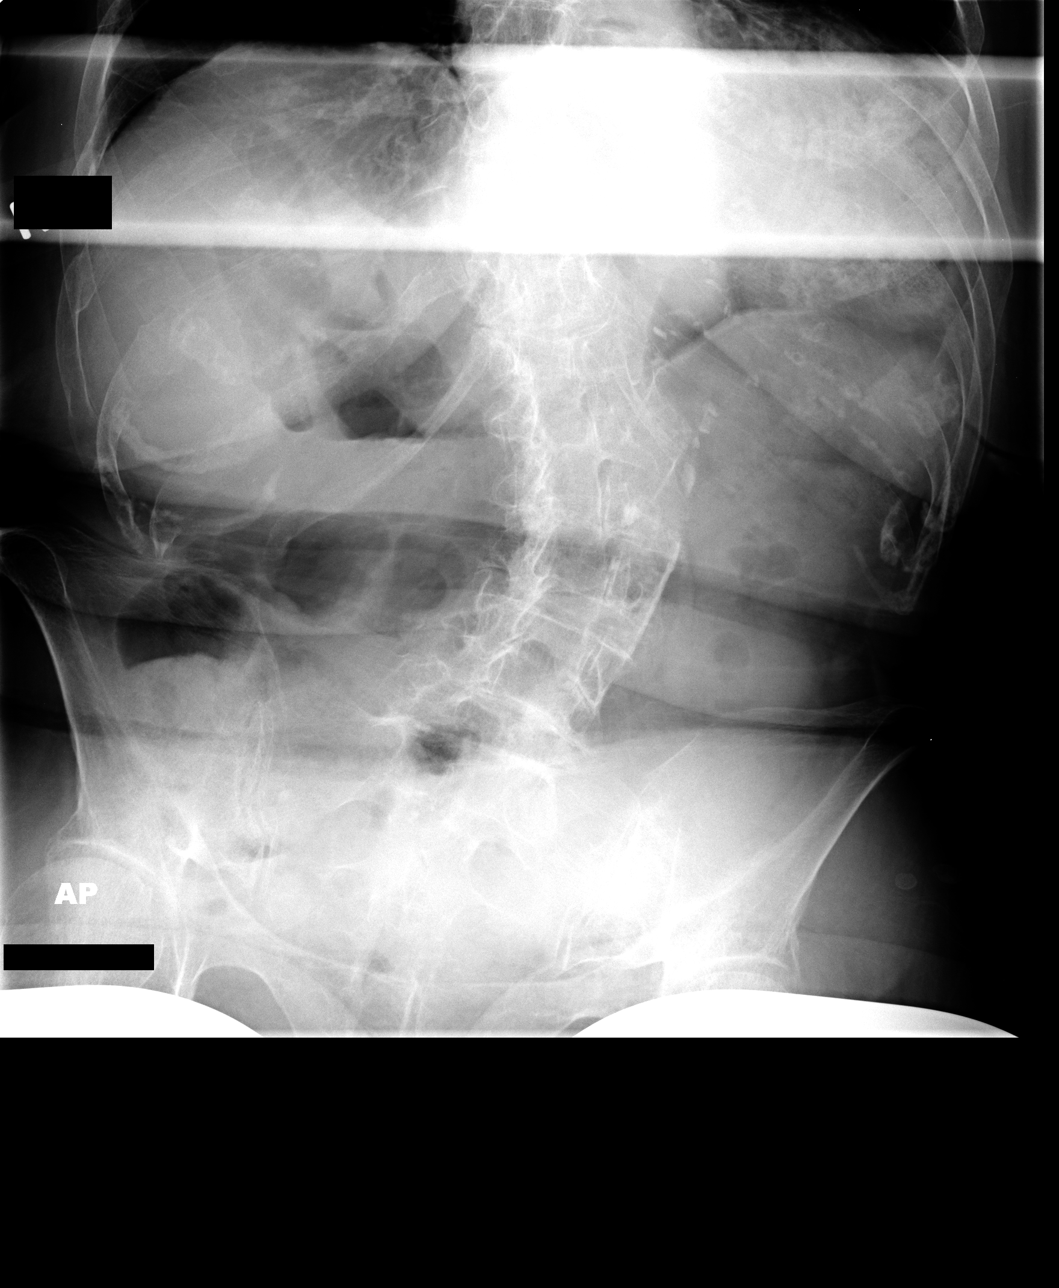

[view not recorded (3 of 3)]
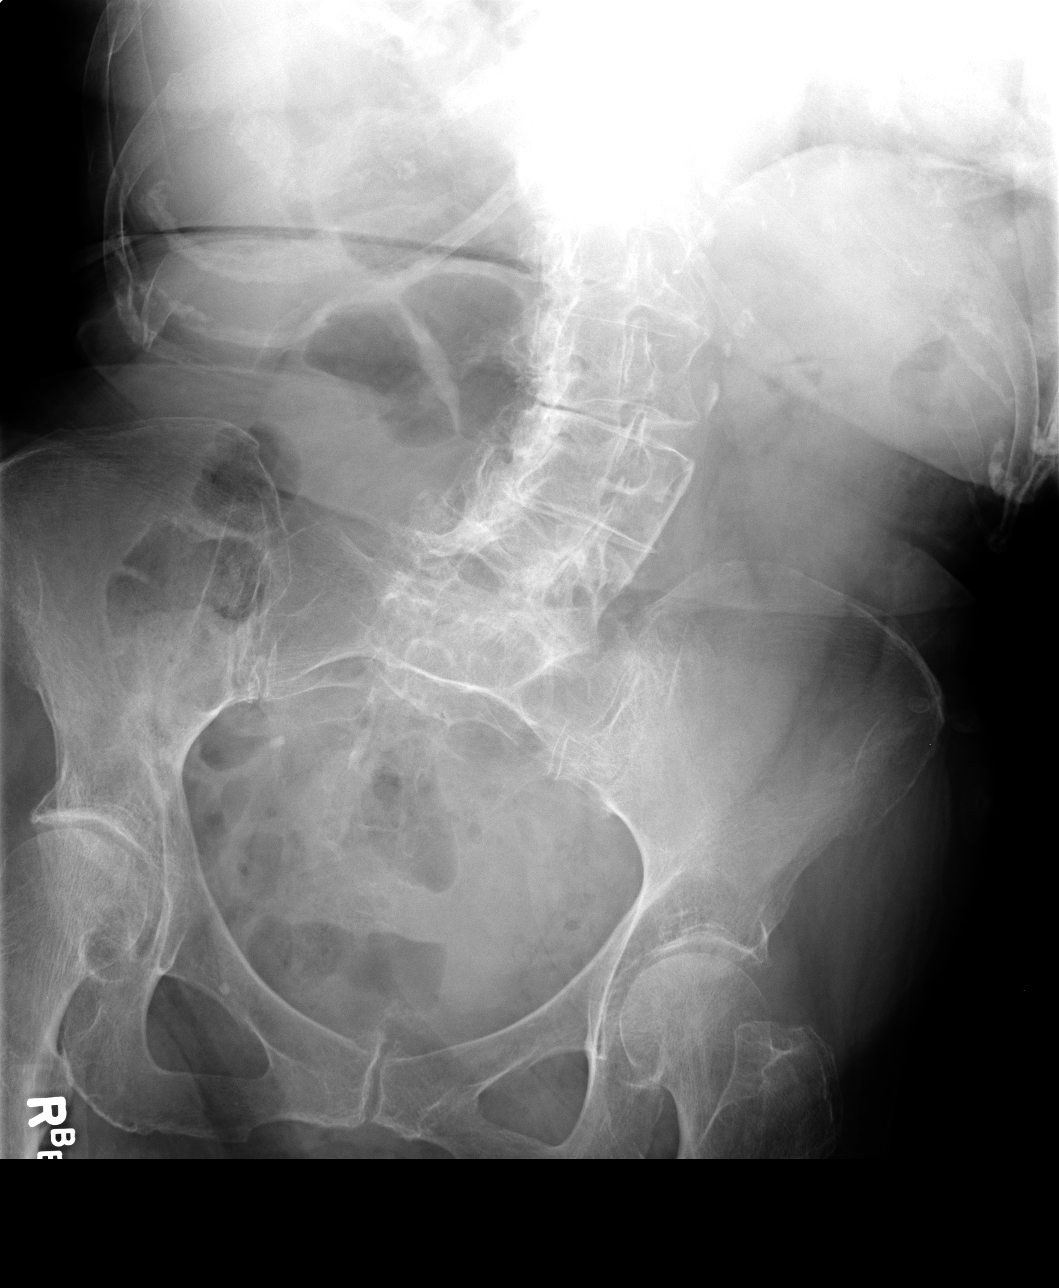

[3 of 3 positions shown; findings below may reference images not displayed]

FINDINGS: Frontal view of the chest demonstrates midline trachea.
Moderate cardiomegaly.  No right and no definite left pleural
fluid.  Ill definition of the left hemidiaphragm, similar to on the
prior. No pneumothorax.  No congestive failure.

Bibasilar volume loss.

Abominal films demonstrate no free intraperitoneal air on upright
positioning.  Degradation superiorly.  Convex left lumbar spine
curvature is moderate.  Aortic atherosclerosis.

No significant bowel dilatation on supine imaging. Distal gas and
stool.
IMPRESSION: 1.  No free intraperitoneal air or bowel obstruction.  The upright
view is degraded by artifact.
2.  Cardiomegaly with grossly similar ill definition of the left
hemidiaphragm.  Possibly secondary to hiatal hernia and adjacent
volume loss. When correlated to the lateral view of 01/13/2009
chest, likely due to eventration of left hemidiaphragm.

## 2013-03-30 IMAGING — RF DG C-ARM 61-120 MIN
1 series · 4 of 4 positions shown · non-contrast
Comparison: None.

CLINICAL DATA: Acute cholangitis.  CBD stone.  Unsuccessful ERCP.

DG C-ARM 1-60 MIN

[Series 1: run · 4 of 4 slices shown]
[im 1/4]
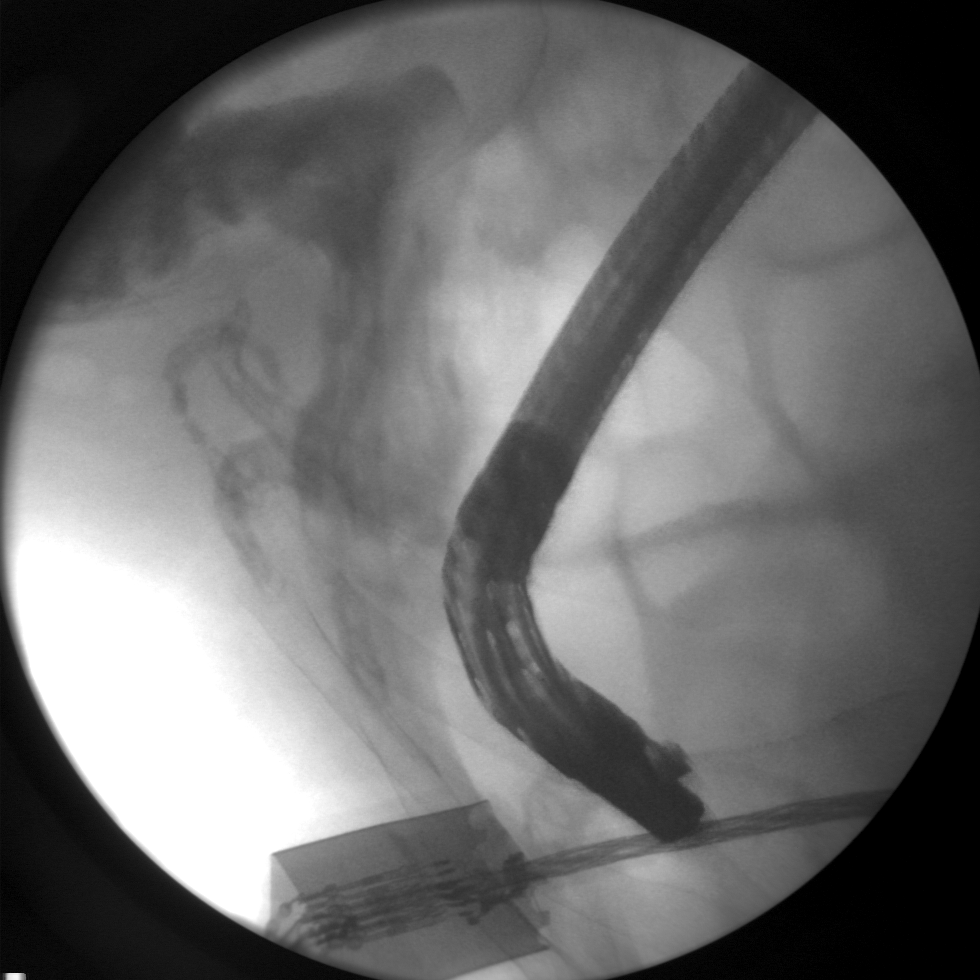
[im 2/4]
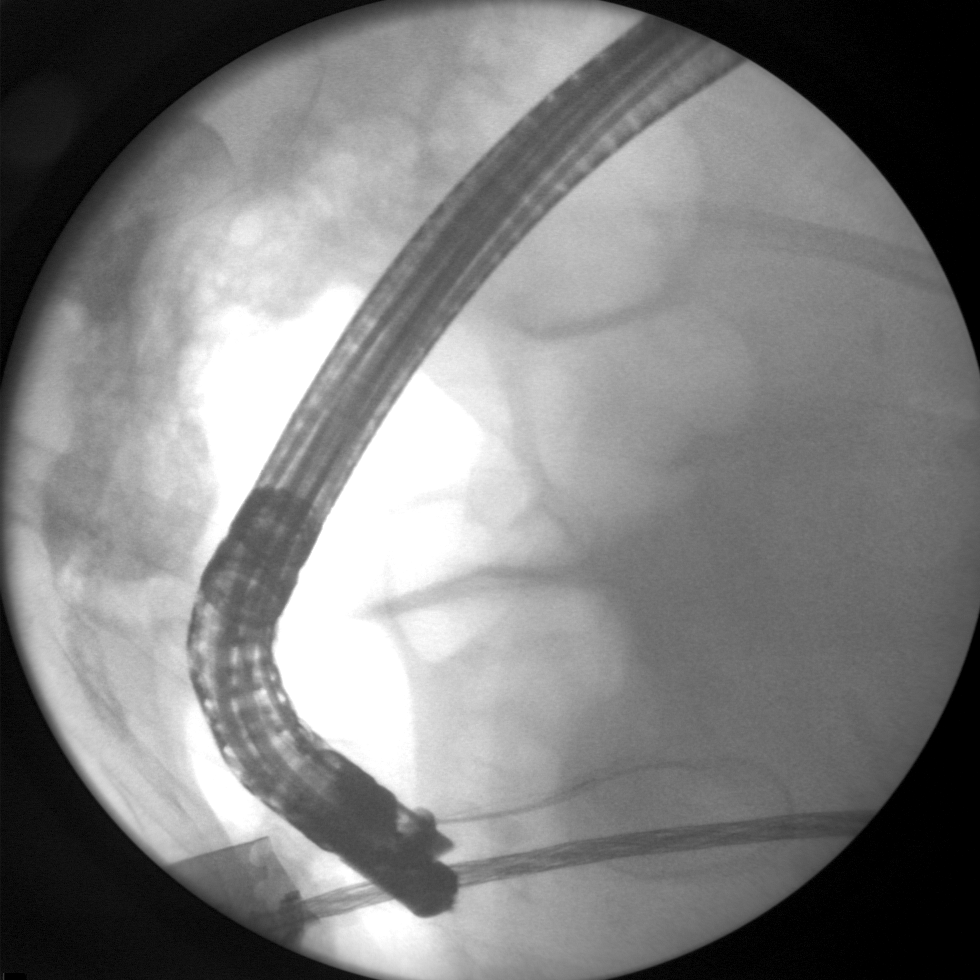
[im 3/4]
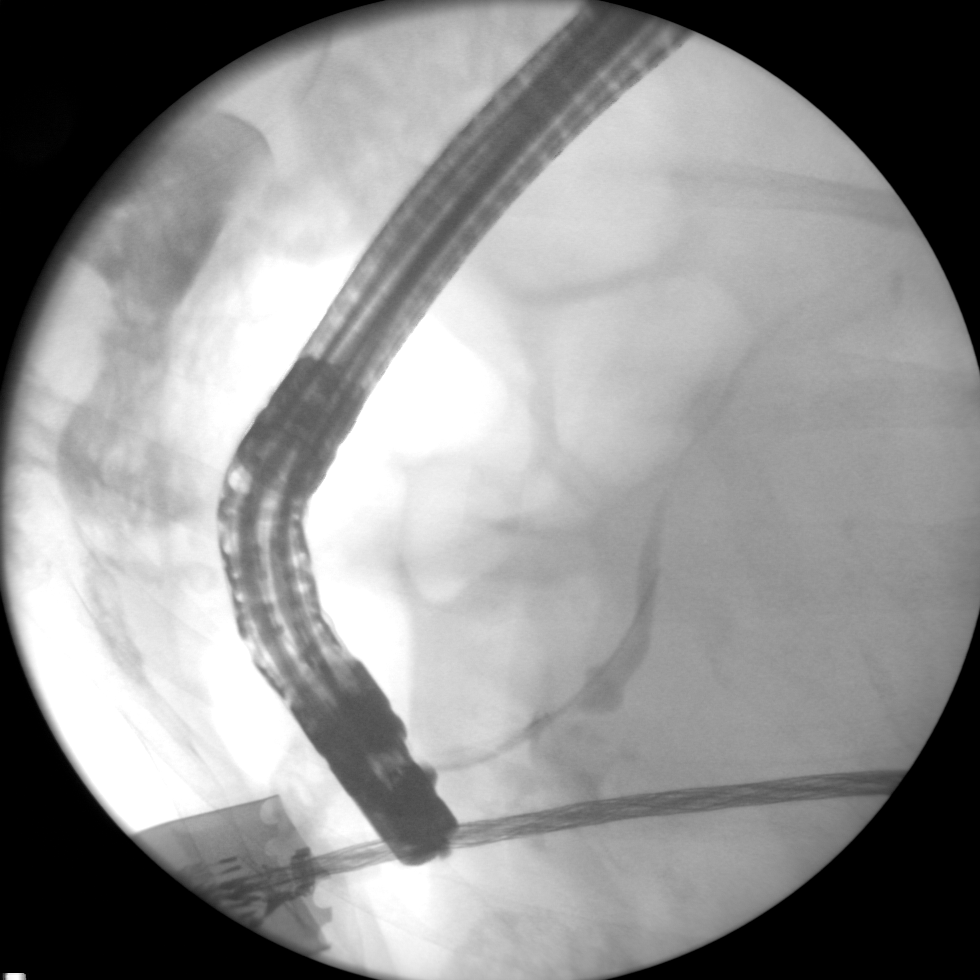
[im 4/4]
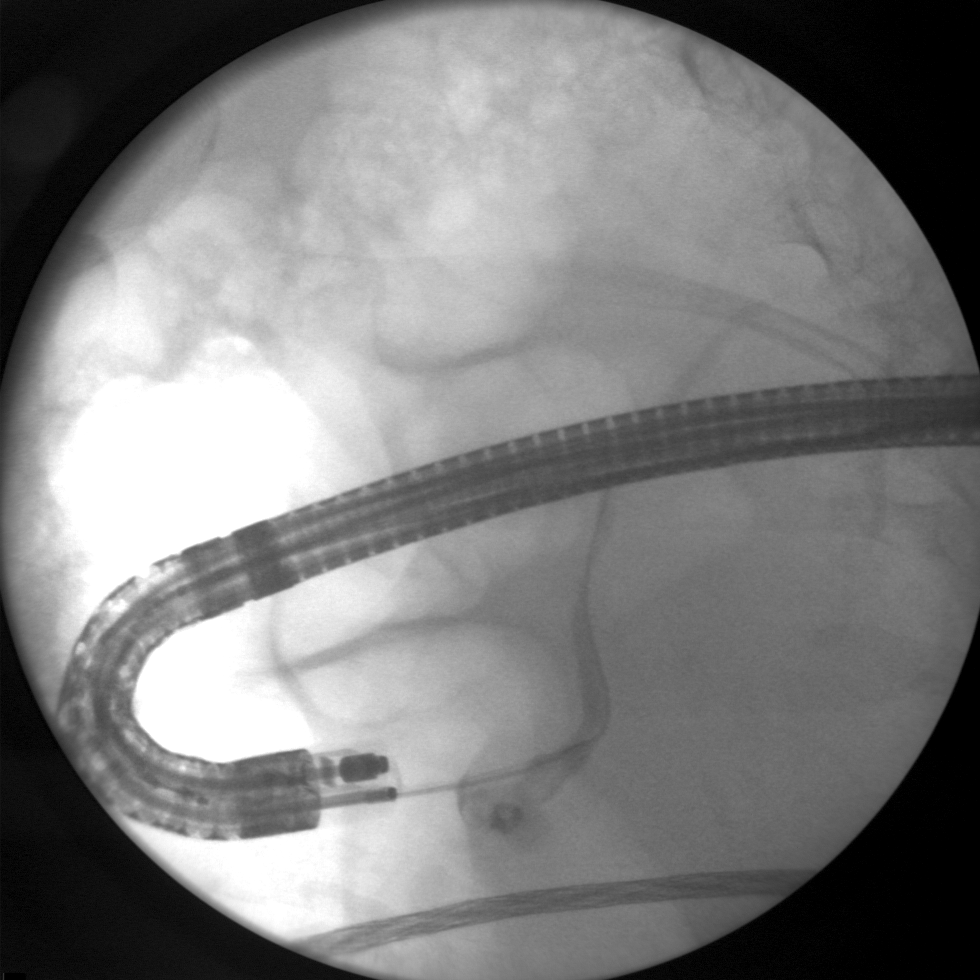

[4 of 4 positions shown; findings below may reference images not displayed]

FINDINGS: C-arm films document attempt at ERCP..  It appears that
the common bile duct was cannulated and not the pancreatic duct.
IMPRESSION: As above.

## 2013-04-01 DIAGNOSIS — F329 Major depressive disorder, single episode, unspecified: Secondary | ICD-10-CM | POA: Diagnosis not present

## 2013-04-03 DIAGNOSIS — F329 Major depressive disorder, single episode, unspecified: Secondary | ICD-10-CM | POA: Diagnosis not present

## 2013-04-08 DIAGNOSIS — F329 Major depressive disorder, single episode, unspecified: Secondary | ICD-10-CM | POA: Diagnosis not present

## 2013-04-10 DIAGNOSIS — F329 Major depressive disorder, single episode, unspecified: Secondary | ICD-10-CM | POA: Diagnosis not present

## 2013-04-15 DIAGNOSIS — F329 Major depressive disorder, single episode, unspecified: Secondary | ICD-10-CM | POA: Diagnosis not present

## 2013-04-17 DIAGNOSIS — F329 Major depressive disorder, single episode, unspecified: Secondary | ICD-10-CM | POA: Diagnosis not present

## 2013-04-17 DIAGNOSIS — Z23 Encounter for immunization: Secondary | ICD-10-CM | POA: Diagnosis not present

## 2013-04-22 DIAGNOSIS — F329 Major depressive disorder, single episode, unspecified: Secondary | ICD-10-CM | POA: Diagnosis not present

## 2013-05-01 DIAGNOSIS — F329 Major depressive disorder, single episode, unspecified: Secondary | ICD-10-CM | POA: Diagnosis not present

## 2013-05-07 DIAGNOSIS — M79609 Pain in unspecified limb: Secondary | ICD-10-CM | POA: Diagnosis not present

## 2013-05-07 DIAGNOSIS — B351 Tinea unguium: Secondary | ICD-10-CM | POA: Diagnosis not present

## 2013-05-14 DIAGNOSIS — M1710 Unilateral primary osteoarthritis, unspecified knee: Secondary | ICD-10-CM | POA: Diagnosis not present

## 2013-05-14 DIAGNOSIS — M25579 Pain in unspecified ankle and joints of unspecified foot: Secondary | ICD-10-CM | POA: Diagnosis not present

## 2013-05-14 DIAGNOSIS — M25569 Pain in unspecified knee: Secondary | ICD-10-CM | POA: Diagnosis not present

## 2013-05-19 DIAGNOSIS — J40 Bronchitis, not specified as acute or chronic: Secondary | ICD-10-CM | POA: Diagnosis not present

## 2013-05-22 DIAGNOSIS — F329 Major depressive disorder, single episode, unspecified: Secondary | ICD-10-CM | POA: Diagnosis not present

## 2013-05-29 DIAGNOSIS — F329 Major depressive disorder, single episode, unspecified: Secondary | ICD-10-CM | POA: Diagnosis not present

## 2013-06-03 DIAGNOSIS — F329 Major depressive disorder, single episode, unspecified: Secondary | ICD-10-CM | POA: Diagnosis not present

## 2013-06-05 DIAGNOSIS — F329 Major depressive disorder, single episode, unspecified: Secondary | ICD-10-CM | POA: Diagnosis not present

## 2013-06-15 DIAGNOSIS — J189 Pneumonia, unspecified organism: Secondary | ICD-10-CM | POA: Diagnosis not present

## 2013-07-01 DIAGNOSIS — F329 Major depressive disorder, single episode, unspecified: Secondary | ICD-10-CM | POA: Diagnosis not present

## 2013-07-03 DIAGNOSIS — F329 Major depressive disorder, single episode, unspecified: Secondary | ICD-10-CM | POA: Diagnosis not present

## 2013-07-08 DIAGNOSIS — F329 Major depressive disorder, single episode, unspecified: Secondary | ICD-10-CM | POA: Diagnosis not present

## 2013-07-08 DIAGNOSIS — F3289 Other specified depressive episodes: Secondary | ICD-10-CM | POA: Diagnosis not present

## 2013-07-10 DIAGNOSIS — F329 Major depressive disorder, single episode, unspecified: Secondary | ICD-10-CM | POA: Diagnosis not present

## 2013-07-10 DIAGNOSIS — F3289 Other specified depressive episodes: Secondary | ICD-10-CM | POA: Diagnosis not present

## 2013-07-14 DIAGNOSIS — J189 Pneumonia, unspecified organism: Secondary | ICD-10-CM | POA: Diagnosis not present

## 2013-07-17 DIAGNOSIS — R059 Cough, unspecified: Secondary | ICD-10-CM | POA: Diagnosis not present

## 2013-07-17 DIAGNOSIS — F4323 Adjustment disorder with mixed anxiety and depressed mood: Secondary | ICD-10-CM | POA: Diagnosis not present

## 2013-07-17 DIAGNOSIS — R05 Cough: Secondary | ICD-10-CM | POA: Diagnosis not present

## 2013-07-22 DIAGNOSIS — F4323 Adjustment disorder with mixed anxiety and depressed mood: Secondary | ICD-10-CM | POA: Diagnosis not present

## 2013-07-24 DIAGNOSIS — F4323 Adjustment disorder with mixed anxiety and depressed mood: Secondary | ICD-10-CM | POA: Diagnosis not present

## 2013-07-29 DIAGNOSIS — F4323 Adjustment disorder with mixed anxiety and depressed mood: Secondary | ICD-10-CM | POA: Diagnosis not present

## 2013-07-31 DIAGNOSIS — F4323 Adjustment disorder with mixed anxiety and depressed mood: Secondary | ICD-10-CM | POA: Diagnosis not present

## 2013-08-12 DIAGNOSIS — F4323 Adjustment disorder with mixed anxiety and depressed mood: Secondary | ICD-10-CM | POA: Diagnosis not present

## 2013-08-14 DIAGNOSIS — F4323 Adjustment disorder with mixed anxiety and depressed mood: Secondary | ICD-10-CM | POA: Diagnosis not present

## 2013-08-20 ENCOUNTER — Ambulatory Visit: Payer: Medicare Other | Admitting: Adult Health

## 2013-08-21 DIAGNOSIS — F4323 Adjustment disorder with mixed anxiety and depressed mood: Secondary | ICD-10-CM | POA: Diagnosis not present

## 2013-08-22 DIAGNOSIS — B351 Tinea unguium: Secondary | ICD-10-CM | POA: Diagnosis not present

## 2013-08-22 DIAGNOSIS — M79609 Pain in unspecified limb: Secondary | ICD-10-CM | POA: Diagnosis not present

## 2013-08-26 DIAGNOSIS — F4323 Adjustment disorder with mixed anxiety and depressed mood: Secondary | ICD-10-CM | POA: Diagnosis not present

## 2013-08-26 DIAGNOSIS — N390 Urinary tract infection, site not specified: Secondary | ICD-10-CM | POA: Diagnosis not present

## 2013-08-28 DIAGNOSIS — F4323 Adjustment disorder with mixed anxiety and depressed mood: Secondary | ICD-10-CM | POA: Diagnosis not present

## 2013-09-02 DIAGNOSIS — F4323 Adjustment disorder with mixed anxiety and depressed mood: Secondary | ICD-10-CM | POA: Diagnosis not present

## 2013-09-03 ENCOUNTER — Ambulatory Visit (INDEPENDENT_AMBULATORY_CARE_PROVIDER_SITE_OTHER): Payer: Medicare Other | Admitting: Adult Health

## 2013-09-03 ENCOUNTER — Encounter: Payer: Self-pay | Admitting: Adult Health

## 2013-09-03 VITALS — BP 129/60 | HR 58 | Ht 59.0 in | Wt 122.0 lb

## 2013-09-03 DIAGNOSIS — I10 Essential (primary) hypertension: Secondary | ICD-10-CM | POA: Diagnosis not present

## 2013-09-03 DIAGNOSIS — J189 Pneumonia, unspecified organism: Secondary | ICD-10-CM | POA: Diagnosis not present

## 2013-09-03 DIAGNOSIS — R0602 Shortness of breath: Secondary | ICD-10-CM | POA: Diagnosis not present

## 2013-09-03 DIAGNOSIS — I2510 Atherosclerotic heart disease of native coronary artery without angina pectoris: Secondary | ICD-10-CM

## 2013-09-03 DIAGNOSIS — K449 Diaphragmatic hernia without obstruction or gangrene: Secondary | ICD-10-CM | POA: Diagnosis not present

## 2013-09-03 DIAGNOSIS — J449 Chronic obstructive pulmonary disease, unspecified: Secondary | ICD-10-CM | POA: Diagnosis not present

## 2013-09-03 DIAGNOSIS — J4489 Other specified chronic obstructive pulmonary disease: Secondary | ICD-10-CM

## 2013-09-03 NOTE — Assessment & Plan Note (Signed)
She is having worsening shortness of breath, fatigue, has not been receiving when necessary inhalers as directed. I have recommended to the [facility] advising him to please keep her her when necessary medications as directed and advised her to please request an as needed. I will check a chest x-ray to evaluate her lung status 2 rule out pneumonia she has had frequent episodes of this.  She is also requesting that her oxygen humidified. He has been ordered this way but it is not always provided to her. I recommended to this effect as well to the [facility].

## 2013-09-03 NOTE — Progress Notes (Signed)
HPI: Candice Meza is an 80 year old patient to be est. with Dr. Beulah Gandy with multiple cardiovascular risk factors here for annual visit. The patient has a history of a previous myocardial infarction with only modest coronary artery disease on cath in 1997. Since that time she had no further cardiac issues. Other history includes hypertension and hyperlipidemia which are well controlled.    In 2014 she had a day stay at [facility] for biliary stent due to the emergent cholecystitis with bile duct blockage from gallstone. The patient has debilitating arthritis and is in a wheelchair. She is frail but bright and talkative. No changes were made on her medication regimen.   She comes today as a resident of the [facility] with main complaint of increasing dyspnea on exertion,fatigue, and weakness. She is fairly limited in her activity due to severe arthritis. She has had a history of pneumonia and sit have some chills over the last 3 days. She denies any fever. Has been coughing. She states that she does not always get her inhalers as she requests them. She denies any productive cough. She denies any chest pain.      Allergies  Allergen Reactions  . Aspirin     GI symptoms  . Bee Venom   . Morphine And Related   . Sulfa Antibiotics     dyspnea  . Tetracyclines & Related   . Codeine Rash  . Pamelor Rash  . Penicillins Rash    Current Outpatient Prescriptions  Medication Sig Dispense Refill  . acetaminophen (TYLENOL) 650 MG CR tablet Take 1,300 mg by mouth 2 (two) times daily as needed. For pain      . albuterol (PROVENTIL) (2.5 MG/3ML) 0.083% nebulizer solution Take 2.5 mg by nebulization every 8 (eight) hours.       . ALPRAZolam (XANAX) 0.25 MG tablet Take 0.25 mg by mouth every 8 (eight) hours as needed. For anxiety      . aluminum & magnesium hydroxide-simethicone (MYLANTA) 500-450-40 MG/5ML suspension Take 15 mLs by mouth at bedtime.      [name] aspirin 81 MG tablet Take 81 mg by  mouth daily.        . beclomethasone (QVAR) 80 MCG/ACT inhaler Inhale 1 puff into the lungs 2 (two) times daily.        . Calcium Carb-Cholecalciferol (CALCIUM 500 +D) 500-400 MG-UNIT TABS Take 1 tablet by mouth daily.      . cetirizine (ZYRTEC) 10 MG tablet Take 10 mg by mouth daily.        . citalopram (CELEXA) 40 MG tablet Take 40 mg by mouth daily.      [name] dextromethorphan (DELSYM) 30 MG/5ML liquid Take 60 mg by mouth 2 (two) times daily as needed. For  cough      . fluticasone (FLONASE) 50 MCG/ACT nasal spray Place 2 sprays into the nose daily.        . furosemide (LASIX) 20 MG tablet Take 20 mg by mouth daily.        . furosemide (LASIX) 40 MG tablet Take 40 mg by mouth as needed.      . GUAIFENESIN PO Take 600 mg by mouth 2 (two) times daily.      [name] ipratropium-albuterol (DUONEB) 0.5-2.5 (3) MG/3ML SOLN Take 3 mLs by nebulization every 2 (two) hours as needed.      . Multiple Vitamin (MULTIVITAMIN) tablet Take 1 tablet by mouth daily.        . nabumetone (RELAFEN) 500  MG tablet Take 500 mg by mouth daily.       . nitroGLYCERIN (NITROSTAT) 0.4 MG SL tablet Place 0.4 mg under the tongue as directed.        . NON FORMULARY Guar Gum Powder - Benefiber Sugar Free (guar gum) Powder 30 Milliters by mouth twice daily for constipation in 8 oz water      . omeprazole (PRILOSEC) 40 MG capsule Take 40 mg by mouth daily.        Bertram Gala Glycol-Propyl Glycol (SYSTANE) 0.4-0.3 % SOLN Apply 1 drop to eye 3 (three) times daily.        . polyethylene glycol powder (GLYCOLAX/MIRALAX) powder Take 17 g by mouth daily.      . potassium chloride (K-DUR) 10 MEQ tablet Take 20 mEq by mouth daily.      . pravastatin (PRAVACHOL) 40 MG tablet Take 40 mg by mouth at bedtime.        . promethazine (PHENERGAN) 25 MG tablet Take 12.5 mg by mouth every 4 (four) hours as needed. For nausea      . traMADol (ULTRAM) 50 MG tablet Take 50 mg by mouth every 6 (six) hours as needed. For pain      . vitamin E 400 UNIT capsule  Take 400 Units by mouth daily.         No current facility-administered medications for this visit.    Past Medical History  Diagnosis Date  . MI, acute, non ST segment elevation 1997    Questionable lesion in the circumflex at catheterization; normal EF; chronic dyspnea without specific cause identified  . Hyperlipidemia   . Hypertension   . Postmenopausal     Estrogen replacement therapy  . Cough     Prolonged, possible asthmatic bronchitis  . Benign essential tremor   . Osteopenia     Versus osteoporosis  . Macular degeneration   . Degenerative joint disease     versus rheumatoid arthritis  . Repeated falls     Past Surgical History  Procedure Laterality Date  . Bladder suspension    . Inguinal hernia repair      Left  . Foot surgery      Bilateral  . Ercp  04/18/2012    Procedure: ENDOSCOPIC RETROGRADE CHOLANGIOPANCREATOGRAPHY (ERCP);  Surgeon: Malissa Hippo, MD;  Location: [facility];  Service: Endoscopy;  Laterality: N/A;  Diagnostic    ROS: Review of systems complete and found to be negative unless listed above  PHYSICAL EXAM BP 129/60  Pulse 58  Ht 4\' 11"  (1.499 m)  Wt 122 lb (55.339 kg)  BMI 24.63 kg/m2  General: Well developed, well nourished, in no acute distress Head: Eyes PERRLA, No xanthomas.   Normal cephalic and atramatic  Lungs: Bilateral crackles, R>L. No wheezes.  Heart: HRRR S1 S2, without MRG.  Pulses are 2+ & equal.            No carotid bruit. No JVD.  No abdominal bruits. No femoral bruits. Abdomen: Bowel sounds are positive, abdomen soft and non-tender without masses or                  Hernia's noted. Msk:  Back normal, normal gait. Normal strength and tone for age. Extremities: Positive for clubbing, no cyanosis or edema.  DP +1 Neuro: Alert and oriented X 3. Psych:  Good affect, responds appropriately  EKG: NSR rate of 58 bpm.  ASSESSMENT AND PLAN

## 2013-09-03 NOTE — Patient Instructions (Addendum)
Your physician recommends that you schedule a follow-up appointment in: 1 month   Your physician has recommended you make the following change in your medication:    Increase Albuterol nebulizations to THREE times a day  Please have chest x-ray done at [facility] center today  PLEASE humidify patients oxygen

## 2013-09-03 NOTE — Progress Notes (Deleted)
Name: JEANICE DEMPSEY    DOB: 10-23-1921  Age: 80 y.o.  MR#: [id number]       PCP:  Estanislado Pandy, MD      Insurance: Payor: MEDICARE / Plan: MEDICARE PART A AND B / Product Type: *No Product type* /   CC:    Chief Complaint  Patient presents with  . Coronary Artery Disease  . Hypertension    VS Filed Vitals:   09/03/13 1400  BP: 129/60  Pulse: 58  Height: 4\' 11"  (1.499 m)  Weight: 122 lb (55.339 kg)    Weights Current Weight  09/03/13 122 lb (55.339 kg)  08/31/12 119 lb 12 oz (54.318 kg)  04/18/12 119 lb 14.9 oz (54.4 kg)    Blood Pressure  BP Readings from Last 3 Encounters:  09/03/13 129/60  08/31/12 115/65  04/18/12 101/62     Admit date:  (Not on file) Last encounter with RMR:  Visit date not found   Allergy Aspirin; Bee venom; Morphine and related; Sulfa antibiotics; Tetracyclines & related; Codeine; Pamelor; and Penicillins  Current Outpatient Prescriptions  Medication Sig Dispense Refill  . citalopram (CELEXA) 40 MG tablet Take 40 mg by mouth daily.      . Multiple Vitamin (MULTIVITAMIN) tablet Take 1 tablet by mouth daily.        . nabumetone (RELAFEN) 500 MG tablet Take 500 mg by mouth daily.       04/20/12 acetaminophen (TYLENOL) 650 MG CR tablet Take 1,300 mg by mouth 2 (two) times daily as needed. For pain      . albuterol (PROVENTIL HFA;VENTOLIN HFA) 108 (90 BASE) MCG/ACT inhaler Inhale 2 puffs into the lungs every 4 (four) hours as needed.        [name] albuterol (PROVENTIL) (2.5 MG/3ML) 0.083% nebulizer solution Take 2.5 mg by nebulization every 4 (four) hours as needed.      . ALPRAZolam (XANAX) 0.25 MG tablet Take 0.25 mg by mouth every 8 (eight) hours as needed. For anxiety      . aspirin 81 MG tablet Take 81 mg by mouth daily.        . beclomethasone (QVAR) 80 MCG/ACT inhaler Inhale 1 puff into the lungs 2 (two) times daily.        . Calcium Carb-Cholecalciferol (CALCIUM 500 +D) 500-400 MG-UNIT TABS Take 1 tablet by mouth daily.      . cetirizine (ZYRTEC) 10  MG tablet Take 10 mg by mouth daily.        [name] dextromethorphan (DELSYM) 30 MG/5ML liquid Take 60 mg by mouth 2 (two) times daily as needed. For  cough      . fluticasone (FLONASE) 50 MCG/ACT nasal spray Place 2 sprays into the nose daily.        . furosemide (LASIX) 20 MG tablet Take 20 mg by mouth daily.        . furosemide (LASIX) 40 MG tablet Take 40 mg by mouth as needed.      . GUAIFENESIN PO Take 600 mg by mouth 2 (two) times daily.      [name] KLOR-CON M20 20 MEQ tablet       . nitroGLYCERIN (NITROSTAT) 0.4 MG SL tablet Place 0.4 mg under the tongue as directed.        [name] omeprazole (PRILOSEC) 40 MG capsule Take 40 mg by mouth daily.        [name] Glycol-Propyl Glycol (SYSTANE) 0.4-0.3 % SOLN Apply 1 drop to eye 3 (three) times daily.        Bertram Gala  polyethylene glycol powder (GLYCOLAX/MIRALAX) powder Take 17 g by mouth daily.      . potassium chloride (K-DUR) 10 MEQ tablet Take 20 mEq by mouth daily.      . pravastatin (PRAVACHOL) 40 MG tablet Take 40 mg by mouth at bedtime.        . promethazine (PHENERGAN) 25 MG tablet Take 12.5 mg by mouth every 4 (four) hours as needed. For nausea      . traMADol (ULTRAM) 50 MG tablet Take 50 mg by mouth every 6 (six) hours as needed. For pain      . vitamin E 400 UNIT capsule Take 400 Units by mouth daily.        . Wheat Dextrin (BENEFIBER PO) Take 30 mLs by mouth 2 (two) times daily. Mix two tablespoons in 8 ounces of water or juice and drink daily       No current facility-administered medications for this visit.    Discontinued Meds:    Medications Discontinued During This Encounter  Medication Reason  . citalopram (CELEXA) 10 MG tablet Error    Patient Active Problem List   Diagnosis Date Noted  . Nausea and vomiting in adult 04/18/2012  . Abdominal pain 04/18/2012  . Transaminitis 04/18/2012  . UTI (lower urinary tract infection) 04/18/2012  . CAD (coronary artery disease) 04/18/2012  . Anemia 06/24/2011  . Repeated falls   . MI,  acute, non ST segment elevation   . Macular degeneration   . Asthmatic bronchitis , chronic 03/24/2011  . BILIARY TRACT DISORDER 05/10/2010  . HYPERLIPIDEMIA 05/04/2009  . HYPERTENSION 05/04/2009  . Gastroesophageal reflux disease 05/04/2009  . DEGENERATIVE JOINT DISEASE 05/04/2009  . OSTEOPENIA 05/04/2009    LABS    Component Value Date/Time   NA 135 04/18/2012 0442   NA 136 04/17/2012 2152   NA 136 08/12/2009   K 3.3* 04/18/2012 0442   K 3.4* 04/17/2012 2152   K 3.4 08/12/2009   CL 98 04/18/2012 0442   CL 98 04/17/2012 2152   CL 99 08/12/2009   CO2 29 04/18/2012 0442   CO2 27 04/17/2012 2152   CO2 27.0 08/12/2009   GLUCOSE 121* 04/18/2012 0442   GLUCOSE 141* 04/17/2012 2152   GLUCOSE 96 08/12/2009   BUN 15 04/18/2012 0442   BUN 20 04/17/2012 2152   BUN 4 08/12/2009   CREATININE 0.30* 04/18/2012 0442   CREATININE 0.34* 04/17/2012 2152   CREATININE 0.20 08/12/2009   CALCIUM 8.9 04/18/2012 0442   CALCIUM 9.3 04/17/2012 2152   CALCIUM 7.6 08/12/2009   GFRNONAA >90 04/18/2012 0442   GFRNONAA >90 04/17/2012 2152   GFRNONAA >60 08/12/2009   GFRAA >90 04/18/2012 0442   GFRAA >90 04/17/2012 2152   CMP     Component Value Date/Time   NA 135 04/18/2012 0442   K 3.3* 04/18/2012 0442   CL 98 04/18/2012 0442   CO2 29 04/18/2012 0442   GLUCOSE 121* 04/18/2012 0442   BUN 15 04/18/2012 0442   CREATININE 0.30* 04/18/2012 0442   CALCIUM 8.9 04/18/2012 0442   PROT 6.5 04/18/2012 0442   ALBUMIN 2.8* 04/18/2012 0442   AST 342* 04/18/2012 0442   ALT 306* 04/18/2012 0442   ALKPHOS 364* 04/18/2012 0442   BILITOT 3.4* 04/18/2012 0442   GFRNONAA >90 04/18/2012 0442   GFRAA >90 04/18/2012 0442       Component Value Date/Time   WBC 5.9 04/18/2012 0442   WBC 5.1 04/17/2012 2152   WBC 6.2 05/01/2008   HGB  12.4 04/18/2012 0442   HGB 12.5 04/17/2012 2152   HGB 11.9 05/01/2008   HCT 36.8 04/18/2012 0442   HCT 36.2 04/17/2012 2152   HCT 37.7 05/01/2008   MCV 99.7 04/18/2012 0442   MCV 99.2  04/17/2012 2152   MCV 102.4 05/01/2008    Lipid Panel     Component Value Date/Time   CHOL 144 05/07/2009 2135   TRIG 117 05/07/2009 2135   HDL 54 05/07/2009 2135   CHOLHDL 2.7 Ratio 05/07/2009 2135   VLDL 23 05/07/2009 2135   LDLCALC 67 05/07/2009 2135    ABG    Component Value Date/Time   PHART 7.420* 01/25/2007 1406   PCO2ART 40.1 01/25/2007 1406   PO2ART 72.2* 01/25/2007 1406   HCO3 25.5* 01/25/2007 1406   TCO2 22.5 01/25/2007 1406   O2SAT 92.6 01/25/2007 1406     Lab Results  Component Value Date   TSH 0.936 04/18/2012   BNP (last 3 results) No results found for this basename: PROBNP,  in the last 8760 hours Cardiac Panel (last 3 results) No results found for this basename: CKTOTAL, CKMB, TROPONINI, RELINDX,  in the last 72 hours  Iron/TIBC/Ferritin No results found for this basename: iron, tibc, ferritin     EKG Orders placed in visit on 09/03/13  . EKG 12-LEAD     Prior Assessment and Plan Problem List as of 09/03/2013     Cardiovascular and Mediastinum   CAD (coronary artery disease)   Last Assessment & Plan   08/31/2012 Office Visit Written 08/31/2012  3:26 PM by Jodelle Gross, NP     She offers no cardiac complaint. Will not plan testing at this time as she is asymptomatic. Continue medical management and risk factor management.    HYPERTENSION   Last Assessment & Plan   08/31/2012 Office Visit Written 08/31/2012  3:25 PM by Jodelle Gross, NP     Excellent control of BP. Will not order labs as she is also being seen by PCP and by Dr. Karilyn Cota who are ordering follow up studies. She is medically compliant. Will continue current medications as directed.    MI, acute, non ST segment elevation   Last Assessment & Plan   06/24/2011 Office Visit Written 06/24/2011  2:54 PM by Kathlen Brunswick, MD     Ms. Means has an area of hypoperfused myocardium in the distribution of her original circumflex event 15 years ago.  In the absence of symptoms and considering  the patient's advanced age, no further evaluation or treatment is warranted.      Respiratory   Asthmatic bronchitis , chronic   Last Assessment & Plan   06/24/2011 Office Visit Written 06/24/2011  1:14 PM by Kathlen Brunswick, MD     Excellent medical regime has been prescribed for patient's chronic lung disease.  Once pneumonia has been adequately treated, respiratory status should improve.      Digestive   Gastroesophageal reflux disease   Last Assessment & Plan   06/24/2011 Office Visit Edited 06/24/2011  1:16 PM by Kathlen Brunswick, MD     Patient is being treated with a nonsteroidal plus aspirin.  While she derives some protection from treatment with a PPI, she should be followed closely to exclude occult GI blood loss, , especially since she is already anemic.  Stool samples for Hemoccult testing had been requested.    BILIARY TRACT DISORDER   Last Assessment & Plan   06/24/2011 Office Visit Written 06/24/2011  2:51  PM by Kathlen Brunswick, MD     Mildly abnormal LFTs with previous evaluation demonstrating minor chronic biliary disease.  Continued observation is appropriate.    Nausea and vomiting in adult     Musculoskeletal and Integument   DEGENERATIVE JOINT DISEASE   OSTEOPENIA     Genitourinary   UTI (lower urinary tract infection)     Other   HYPERLIPIDEMIA   Last Assessment & Plan   08/31/2012 Office Visit Written 08/31/2012  3:27 PM by Jodelle Gross, NP     Continue statin as directed. Labs are due per PCP visit.    Repeated falls   Last Assessment & Plan   01/24/2012 Office Visit Written 01/24/2012  2:36 PM by Kathlen Brunswick, MD     No falls since entering the [facility] in 2009    Macular degeneration   Anemia   Last Assessment & Plan   01/24/2012 Office Visit Written 01/24/2012  2:33 PM by Kathlen Brunswick, MD     Anemia has resolved without any specific etiology being identified.  Mild macrocytosis persists.    Abdominal pain   Transaminitis        Imaging: No results found.

## 2013-09-03 NOTE — Assessment & Plan Note (Signed)
Blood pressure is well controlled currently. No changes in medical regimen. 

## 2013-09-04 DIAGNOSIS — Z79899 Other long term (current) drug therapy: Secondary | ICD-10-CM | POA: Diagnosis not present

## 2013-09-08 DIAGNOSIS — J45902 Unspecified asthma with status asthmaticus: Secondary | ICD-10-CM | POA: Diagnosis not present

## 2013-09-09 DIAGNOSIS — F4323 Adjustment disorder with mixed anxiety and depressed mood: Secondary | ICD-10-CM | POA: Diagnosis not present

## 2013-09-11 DIAGNOSIS — F3289 Other specified depressive episodes: Secondary | ICD-10-CM | POA: Diagnosis not present

## 2013-09-11 DIAGNOSIS — F329 Major depressive disorder, single episode, unspecified: Secondary | ICD-10-CM | POA: Diagnosis not present

## 2013-09-16 DIAGNOSIS — F329 Major depressive disorder, single episode, unspecified: Secondary | ICD-10-CM | POA: Diagnosis not present

## 2013-09-16 DIAGNOSIS — F3289 Other specified depressive episodes: Secondary | ICD-10-CM | POA: Diagnosis not present

## 2013-09-17 DIAGNOSIS — E46 Unspecified protein-calorie malnutrition: Secondary | ICD-10-CM | POA: Diagnosis not present

## 2013-09-17 DIAGNOSIS — R1312 Dysphagia, oropharyngeal phase: Secondary | ICD-10-CM | POA: Diagnosis not present

## 2013-09-17 DIAGNOSIS — R269 Unspecified abnormalities of gait and mobility: Secondary | ICD-10-CM | POA: Diagnosis not present

## 2013-09-17 DIAGNOSIS — I259 Chronic ischemic heart disease, unspecified: Secondary | ICD-10-CM | POA: Diagnosis not present

## 2013-09-17 DIAGNOSIS — M069 Rheumatoid arthritis, unspecified: Secondary | ICD-10-CM | POA: Diagnosis not present

## 2013-09-17 DIAGNOSIS — M2550 Pain in unspecified joint: Secondary | ICD-10-CM | POA: Diagnosis not present

## 2013-09-17 DIAGNOSIS — R279 Unspecified lack of coordination: Secondary | ICD-10-CM | POA: Diagnosis not present

## 2013-09-17 DIAGNOSIS — K225 Diverticulum of esophagus, acquired: Secondary | ICD-10-CM | POA: Diagnosis not present

## 2013-09-17 DIAGNOSIS — J189 Pneumonia, unspecified organism: Secondary | ICD-10-CM | POA: Diagnosis not present

## 2013-09-18 DIAGNOSIS — I259 Chronic ischemic heart disease, unspecified: Secondary | ICD-10-CM | POA: Diagnosis not present

## 2013-09-18 DIAGNOSIS — R279 Unspecified lack of coordination: Secondary | ICD-10-CM | POA: Diagnosis not present

## 2013-09-18 DIAGNOSIS — M069 Rheumatoid arthritis, unspecified: Secondary | ICD-10-CM | POA: Diagnosis not present

## 2013-09-18 DIAGNOSIS — F3289 Other specified depressive episodes: Secondary | ICD-10-CM | POA: Diagnosis not present

## 2013-09-18 DIAGNOSIS — J189 Pneumonia, unspecified organism: Secondary | ICD-10-CM | POA: Diagnosis not present

## 2013-09-18 DIAGNOSIS — E46 Unspecified protein-calorie malnutrition: Secondary | ICD-10-CM | POA: Diagnosis not present

## 2013-09-18 DIAGNOSIS — F329 Major depressive disorder, single episode, unspecified: Secondary | ICD-10-CM | POA: Diagnosis not present

## 2013-09-18 DIAGNOSIS — K225 Diverticulum of esophagus, acquired: Secondary | ICD-10-CM | POA: Diagnosis not present

## 2013-09-19 DIAGNOSIS — I259 Chronic ischemic heart disease, unspecified: Secondary | ICD-10-CM | POA: Diagnosis not present

## 2013-09-19 DIAGNOSIS — J189 Pneumonia, unspecified organism: Secondary | ICD-10-CM | POA: Diagnosis not present

## 2013-09-19 DIAGNOSIS — E46 Unspecified protein-calorie malnutrition: Secondary | ICD-10-CM | POA: Diagnosis not present

## 2013-09-19 DIAGNOSIS — M069 Rheumatoid arthritis, unspecified: Secondary | ICD-10-CM | POA: Diagnosis not present

## 2013-09-19 DIAGNOSIS — K225 Diverticulum of esophagus, acquired: Secondary | ICD-10-CM | POA: Diagnosis not present

## 2013-09-19 DIAGNOSIS — R279 Unspecified lack of coordination: Secondary | ICD-10-CM | POA: Diagnosis not present

## 2013-09-20 DIAGNOSIS — E46 Unspecified protein-calorie malnutrition: Secondary | ICD-10-CM | POA: Diagnosis not present

## 2013-09-20 DIAGNOSIS — I259 Chronic ischemic heart disease, unspecified: Secondary | ICD-10-CM | POA: Diagnosis not present

## 2013-09-20 DIAGNOSIS — R279 Unspecified lack of coordination: Secondary | ICD-10-CM | POA: Diagnosis not present

## 2013-09-20 DIAGNOSIS — M069 Rheumatoid arthritis, unspecified: Secondary | ICD-10-CM | POA: Diagnosis not present

## 2013-09-20 DIAGNOSIS — K225 Diverticulum of esophagus, acquired: Secondary | ICD-10-CM | POA: Diagnosis not present

## 2013-09-20 DIAGNOSIS — J189 Pneumonia, unspecified organism: Secondary | ICD-10-CM | POA: Diagnosis not present

## 2013-09-23 DIAGNOSIS — K225 Diverticulum of esophagus, acquired: Secondary | ICD-10-CM | POA: Diagnosis not present

## 2013-09-23 DIAGNOSIS — R279 Unspecified lack of coordination: Secondary | ICD-10-CM | POA: Diagnosis not present

## 2013-09-23 DIAGNOSIS — I259 Chronic ischemic heart disease, unspecified: Secondary | ICD-10-CM | POA: Diagnosis not present

## 2013-09-23 DIAGNOSIS — E46 Unspecified protein-calorie malnutrition: Secondary | ICD-10-CM | POA: Diagnosis not present

## 2013-09-23 DIAGNOSIS — F329 Major depressive disorder, single episode, unspecified: Secondary | ICD-10-CM | POA: Diagnosis not present

## 2013-09-23 DIAGNOSIS — J189 Pneumonia, unspecified organism: Secondary | ICD-10-CM | POA: Diagnosis not present

## 2013-09-23 DIAGNOSIS — F3289 Other specified depressive episodes: Secondary | ICD-10-CM | POA: Diagnosis not present

## 2013-09-23 DIAGNOSIS — M069 Rheumatoid arthritis, unspecified: Secondary | ICD-10-CM | POA: Diagnosis not present

## 2013-09-25 DIAGNOSIS — I259 Chronic ischemic heart disease, unspecified: Secondary | ICD-10-CM | POA: Diagnosis not present

## 2013-09-25 DIAGNOSIS — R279 Unspecified lack of coordination: Secondary | ICD-10-CM | POA: Diagnosis not present

## 2013-09-25 DIAGNOSIS — M069 Rheumatoid arthritis, unspecified: Secondary | ICD-10-CM | POA: Diagnosis not present

## 2013-09-25 DIAGNOSIS — J189 Pneumonia, unspecified organism: Secondary | ICD-10-CM | POA: Diagnosis not present

## 2013-09-25 DIAGNOSIS — E46 Unspecified protein-calorie malnutrition: Secondary | ICD-10-CM | POA: Diagnosis not present

## 2013-09-25 DIAGNOSIS — K225 Diverticulum of esophagus, acquired: Secondary | ICD-10-CM | POA: Diagnosis not present

## 2013-09-26 DIAGNOSIS — I259 Chronic ischemic heart disease, unspecified: Secondary | ICD-10-CM | POA: Diagnosis not present

## 2013-09-26 DIAGNOSIS — K225 Diverticulum of esophagus, acquired: Secondary | ICD-10-CM | POA: Diagnosis not present

## 2013-09-26 DIAGNOSIS — E46 Unspecified protein-calorie malnutrition: Secondary | ICD-10-CM | POA: Diagnosis not present

## 2013-09-26 DIAGNOSIS — M069 Rheumatoid arthritis, unspecified: Secondary | ICD-10-CM | POA: Diagnosis not present

## 2013-09-26 DIAGNOSIS — J189 Pneumonia, unspecified organism: Secondary | ICD-10-CM | POA: Diagnosis not present

## 2013-09-26 DIAGNOSIS — R279 Unspecified lack of coordination: Secondary | ICD-10-CM | POA: Diagnosis not present

## 2013-09-27 DIAGNOSIS — M069 Rheumatoid arthritis, unspecified: Secondary | ICD-10-CM | POA: Diagnosis not present

## 2013-09-27 DIAGNOSIS — K225 Diverticulum of esophagus, acquired: Secondary | ICD-10-CM | POA: Diagnosis not present

## 2013-09-27 DIAGNOSIS — R279 Unspecified lack of coordination: Secondary | ICD-10-CM | POA: Diagnosis not present

## 2013-09-27 DIAGNOSIS — I259 Chronic ischemic heart disease, unspecified: Secondary | ICD-10-CM | POA: Diagnosis not present

## 2013-09-27 DIAGNOSIS — E46 Unspecified protein-calorie malnutrition: Secondary | ICD-10-CM | POA: Diagnosis not present

## 2013-09-27 DIAGNOSIS — J189 Pneumonia, unspecified organism: Secondary | ICD-10-CM | POA: Diagnosis not present

## 2013-09-30 DIAGNOSIS — F329 Major depressive disorder, single episode, unspecified: Secondary | ICD-10-CM | POA: Diagnosis not present

## 2013-09-30 DIAGNOSIS — J189 Pneumonia, unspecified organism: Secondary | ICD-10-CM | POA: Diagnosis not present

## 2013-09-30 DIAGNOSIS — R279 Unspecified lack of coordination: Secondary | ICD-10-CM | POA: Diagnosis not present

## 2013-09-30 DIAGNOSIS — M069 Rheumatoid arthritis, unspecified: Secondary | ICD-10-CM | POA: Diagnosis not present

## 2013-09-30 DIAGNOSIS — E46 Unspecified protein-calorie malnutrition: Secondary | ICD-10-CM | POA: Diagnosis not present

## 2013-09-30 DIAGNOSIS — I259 Chronic ischemic heart disease, unspecified: Secondary | ICD-10-CM | POA: Diagnosis not present

## 2013-09-30 DIAGNOSIS — F3289 Other specified depressive episodes: Secondary | ICD-10-CM | POA: Diagnosis not present

## 2013-09-30 DIAGNOSIS — K225 Diverticulum of esophagus, acquired: Secondary | ICD-10-CM | POA: Diagnosis not present

## 2013-10-01 DIAGNOSIS — R279 Unspecified lack of coordination: Secondary | ICD-10-CM | POA: Diagnosis not present

## 2013-10-01 DIAGNOSIS — J189 Pneumonia, unspecified organism: Secondary | ICD-10-CM | POA: Diagnosis not present

## 2013-10-01 DIAGNOSIS — E46 Unspecified protein-calorie malnutrition: Secondary | ICD-10-CM | POA: Diagnosis not present

## 2013-10-01 DIAGNOSIS — M069 Rheumatoid arthritis, unspecified: Secondary | ICD-10-CM | POA: Diagnosis not present

## 2013-10-01 DIAGNOSIS — K225 Diverticulum of esophagus, acquired: Secondary | ICD-10-CM | POA: Diagnosis not present

## 2013-10-01 DIAGNOSIS — I259 Chronic ischemic heart disease, unspecified: Secondary | ICD-10-CM | POA: Diagnosis not present

## 2013-10-02 DIAGNOSIS — M069 Rheumatoid arthritis, unspecified: Secondary | ICD-10-CM | POA: Diagnosis not present

## 2013-10-02 DIAGNOSIS — R269 Unspecified abnormalities of gait and mobility: Secondary | ICD-10-CM | POA: Diagnosis not present

## 2013-10-02 DIAGNOSIS — J189 Pneumonia, unspecified organism: Secondary | ICD-10-CM | POA: Diagnosis not present

## 2013-10-02 DIAGNOSIS — M6250 Muscle wasting and atrophy, not elsewhere classified, unspecified site: Secondary | ICD-10-CM | POA: Diagnosis not present

## 2013-10-02 DIAGNOSIS — R279 Unspecified lack of coordination: Secondary | ICD-10-CM | POA: Diagnosis not present

## 2013-10-02 DIAGNOSIS — K225 Diverticulum of esophagus, acquired: Secondary | ICD-10-CM | POA: Diagnosis not present

## 2013-10-02 DIAGNOSIS — M6281 Muscle weakness (generalized): Secondary | ICD-10-CM | POA: Diagnosis not present

## 2013-10-02 DIAGNOSIS — E46 Unspecified protein-calorie malnutrition: Secondary | ICD-10-CM | POA: Diagnosis not present

## 2013-10-02 DIAGNOSIS — I259 Chronic ischemic heart disease, unspecified: Secondary | ICD-10-CM | POA: Diagnosis not present

## 2013-10-03 DIAGNOSIS — F329 Major depressive disorder, single episode, unspecified: Secondary | ICD-10-CM | POA: Diagnosis not present

## 2013-10-03 DIAGNOSIS — F3289 Other specified depressive episodes: Secondary | ICD-10-CM | POA: Diagnosis not present

## 2013-10-07 DIAGNOSIS — F3289 Other specified depressive episodes: Secondary | ICD-10-CM | POA: Diagnosis not present

## 2013-10-07 DIAGNOSIS — F329 Major depressive disorder, single episode, unspecified: Secondary | ICD-10-CM | POA: Diagnosis not present

## 2013-10-09 DIAGNOSIS — F3289 Other specified depressive episodes: Secondary | ICD-10-CM | POA: Diagnosis not present

## 2013-10-09 DIAGNOSIS — F329 Major depressive disorder, single episode, unspecified: Secondary | ICD-10-CM | POA: Diagnosis not present

## 2013-10-10 ENCOUNTER — Encounter: Payer: Self-pay | Admitting: Cardiology

## 2013-10-10 ENCOUNTER — Ambulatory Visit (INDEPENDENT_AMBULATORY_CARE_PROVIDER_SITE_OTHER): Payer: Medicare Other | Admitting: Cardiology

## 2013-10-10 VITALS — BP 142/60 | HR 82 | Ht 59.0 in | Wt 122.0 lb

## 2013-10-10 DIAGNOSIS — I10 Essential (primary) hypertension: Secondary | ICD-10-CM | POA: Diagnosis not present

## 2013-10-10 DIAGNOSIS — E785 Hyperlipidemia, unspecified: Secondary | ICD-10-CM | POA: Diagnosis not present

## 2013-10-10 DIAGNOSIS — R0602 Shortness of breath: Secondary | ICD-10-CM

## 2013-10-10 DIAGNOSIS — I2510 Atherosclerotic heart disease of native coronary artery without angina pectoris: Secondary | ICD-10-CM

## 2013-10-10 DIAGNOSIS — R011 Cardiac murmur, unspecified: Secondary | ICD-10-CM

## 2013-10-10 MED ORDER — FUROSEMIDE 40 MG PO TABS: 40.0000 mg | ORAL_TABLET | Freq: Every day | ORAL | Status: DC

## 2013-10-10 NOTE — Patient Instructions (Addendum)
Your physician recommends that you schedule a follow-up appointment in:  1 month   Your physician has recommended you make the following change in your medication:    INCREASE Lasix to 40 mg daily  Your physician has requested that you have an echocardiogram. Echocardiography is a painless test that uses sound waves to create images of your heart. It provides your doctor with information about the size and shape of your heart and how well your heart's chambers and valves are working. This procedure takes approximately one hour. There are no restrictions for this procedure.    Thank you for choosing [facility] !

## 2013-10-10 NOTE — Progress Notes (Signed)
Clinical Summary Candice Meza is a 80 y.o.female last seen by NP Lyman Bishop, this is our first visit together. She is seen for the following medical problems.  1. CAD - prior MI in 1997, non-obstructive disease by cath at that time.  - denies any chest pain - DOE walking down hall at nursing home which stable, + edema in legs, +orthopnea and sleeps at angle which is chronic.  - echo 2008 LVEF 60%, mild AI  2. HTN - compliant with meds  3. Hyperlipidemia - compliant statin - no recent panel in our system, she reports fairly recent labs at her nursing home  4. Bronchitis - denies any smoking history. She reports being around second hand smoke regularly.  - per NP Lawrences note patient with worsening SOB and cough at last visit, seems she had not been receiving her inhalers regularly at her nursing home.  - CXR was ordred at that time but does not appear it was completed  - she states inhalers given regularly now, with some improvement with SOB. Productive cough at night, productive and clear.  Past Medical History  Diagnosis Date  . MI, acute, non ST segment elevation 1997    Questionable lesion in the circumflex at catheterization; normal EF; chronic dyspnea without specific cause identified  . Hyperlipidemia   . Hypertension   . Postmenopausal     Estrogen replacement therapy  . Cough     Prolonged, possible asthmatic bronchitis  . Benign essential tremor   . Osteopenia     Versus osteoporosis  . Macular degeneration   . Degenerative joint disease     versus rheumatoid arthritis  . Repeated falls      Allergies  Allergen Reactions  . Aspirin     GI symptoms  . Bee Venom   . Morphine And Related   . Sulfa Antibiotics     dyspnea  . Tetracyclines & Related   . Codeine Rash  . Pamelor Rash  . Penicillins Rash     Current Outpatient Prescriptions  Medication Sig Dispense Refill  . acetaminophen (TYLENOL) 650 MG CR tablet Take 1,300 mg by mouth 2 (two)  times daily as needed. For pain      . albuterol (PROVENTIL) (2.5 MG/3ML) 0.083% nebulizer solution Take 2.5 mg by nebulization every 8 (eight) hours.       . ALPRAZolam (XANAX) 0.25 MG tablet Take 0.25 mg by mouth every 8 (eight) hours as needed. For anxiety      . aluminum & magnesium hydroxide-simethicone (MYLANTA) 500-450-40 MG/5ML suspension Take 15 mLs by mouth at bedtime.      [name] aspirin 81 MG tablet Take 81 mg by mouth daily.        . beclomethasone (QVAR) 80 MCG/ACT inhaler Inhale 1 puff into the lungs 2 (two) times daily.        . Calcium Carb-Cholecalciferol (CALCIUM 500 +D) 500-400 MG-UNIT TABS Take 1 tablet by mouth daily.      . cetirizine (ZYRTEC) 10 MG tablet Take 10 mg by mouth daily.        . citalopram (CELEXA) 40 MG tablet Take 40 mg by mouth daily.      [name] dextromethorphan (DELSYM) 30 MG/5ML liquid Take 60 mg by mouth 2 (two) times daily as needed. For  cough      . fluticasone (FLONASE) 50 MCG/ACT nasal spray Place 2 sprays into the nose daily.        . furosemide (LASIX) 20 MG tablet  Take 20 mg by mouth daily.        . furosemide (LASIX) 40 MG tablet Take 40 mg by mouth as needed.      . GUAIFENESIN PO Take 600 mg by mouth 2 (two) times daily.      [name] ipratropium-albuterol (DUONEB) 0.5-2.5 (3) MG/3ML SOLN Take 3 mLs by nebulization every 2 (two) hours as needed.      . Multiple Vitamin (MULTIVITAMIN) tablet Take 1 tablet by mouth daily.        . nabumetone (RELAFEN) 500 MG tablet Take 500 mg by mouth daily.       . nitroGLYCERIN (NITROSTAT) 0.4 MG SL tablet Place 0.4 mg under the tongue as directed.        . NON FORMULARY Guar Gum Powder - Benefiber Sugar Free (guar gum) Powder 30 Milliters by mouth twice daily for constipation in 8 oz water      . omeprazole (PRILOSEC) 40 MG capsule Take 40 mg by mouth daily.        Bertram Gala Glycol-Propyl Glycol (SYSTANE) 0.4-0.3 % SOLN Apply 1 drop to eye 3 (three) times daily.        . polyethylene glycol powder (GLYCOLAX/MIRALAX)  powder Take 17 g by mouth daily.      . potassium chloride (K-DUR) 10 MEQ tablet Take 20 mEq by mouth daily.      . pravastatin (PRAVACHOL) 40 MG tablet Take 40 mg by mouth at bedtime.        . promethazine (PHENERGAN) 25 MG tablet Take 12.5 mg by mouth every 4 (four) hours as needed. For nausea      . traMADol (ULTRAM) 50 MG tablet Take 50 mg by mouth every 6 (six) hours as needed. For pain      . vitamin E 400 UNIT capsule Take 400 Units by mouth daily.         No current facility-administered medications for this visit.     Past Surgical History  Procedure Laterality Date  . Bladder suspension    . Inguinal hernia repair      Left  . Foot surgery      Bilateral  . Ercp  04/18/2012    Procedure: ENDOSCOPIC RETROGRADE CHOLANGIOPANCREATOGRAPHY (ERCP);  Surgeon: Malissa Hippo, MD;  Location: [facility];  Service: Endoscopy;  Laterality: N/A;  Diagnostic     Allergies  Allergen Reactions  . Aspirin     GI symptoms  . Bee Venom   . Morphine And Related   . Sulfa Antibiotics     dyspnea  . Tetracyclines & Related   . Codeine Rash  . Pamelor Rash  . Penicillins Rash      Family History  Problem Relation Age of Onset  . Coronary artery disease    . Stroke       Social History Candice Meza reports that she has never smoked. She has never used smokeless tobacco. Candice Meza reports that she does not drink alcohol.   Review of Systems CONSTITUTIONAL: No weight loss, fever, chills, weakness or fatigue.  HEENT: Eyes: No visual loss, blurred vision, double vision or yellow sclerae.No hearing loss, sneezing, congestion, runny nose or sore throat.  SKIN: No rash or itching.  CARDIOVASCULAR: per HPI RESPIRATORY: per HPI GASTROINTESTINAL: No anorexia, nausea, vomiting or diarrhea. No abdominal pain or blood.  GENITOURINARY: No burning on urination, no polyuria NEUROLOGICAL: No headache, dizziness, syncope, paralysis, ataxia, numbness or tingling in the extremities. No  change in bowel or bladder  control.  MUSCULOSKELETAL: No muscle, back pain, joint pain or stiffness.  LYMPHATICS: No enlarged nodes. No history of splenectomy.  PSYCHIATRIC: No history of depression or anxiety.  ENDOCRINOLOGIC: No reports of sweating, cold or heat intolerance. No polyuria or polydipsia.  [name]   Physical Examination p 82 bp 142/60 Wt 122 lbs BMI 25 Gen: resting comfortably, no acute distress HEENT: no scleral icterus, pupils equal round and reactive, no palptable cervical adenopathy,  CV: RRR, 2/6 systolic murmur RUSB, no JVD Resp: Clear to auscultation bilaterally GI: abdomen is soft, non-tender, non-distended, normal bowel sounds, no hepatosplenomegaly MSK: extremities are warm, 1+ bilateral edema Skin: warm, no rash Neuro:  no focal deficits Psych: appropriate affect     Assessment and Plan   1. CAD - no current chest pain - symptoms of DOE more suggestive of CHF as opposed to ischemia, will obtain echo  2. HTN - at goal, continue current meds  3. Hyperlipidemia - will request labs from her nursing home, continue current statin for now  4. Bronchtitis - per PCP, we are awaiting the results of the CXR she had done at her nursing home  5. Heart murmur - follow up echo results  6. Dyspnea - likely multifactorial. Will follow up echo results. Evidence of fluid overload on exam, increase her lasix to 40mg  daily   , M.D., F.A.C.C.

## 2013-10-14 DIAGNOSIS — F3289 Other specified depressive episodes: Secondary | ICD-10-CM | POA: Diagnosis not present

## 2013-10-14 DIAGNOSIS — F329 Major depressive disorder, single episode, unspecified: Secondary | ICD-10-CM | POA: Diagnosis not present

## 2013-10-21 DIAGNOSIS — F3289 Other specified depressive episodes: Secondary | ICD-10-CM | POA: Diagnosis not present

## 2013-10-21 DIAGNOSIS — F329 Major depressive disorder, single episode, unspecified: Secondary | ICD-10-CM | POA: Diagnosis not present

## 2013-10-23 DIAGNOSIS — F329 Major depressive disorder, single episode, unspecified: Secondary | ICD-10-CM | POA: Diagnosis not present

## 2013-10-23 DIAGNOSIS — F3289 Other specified depressive episodes: Secondary | ICD-10-CM | POA: Diagnosis not present

## 2013-10-28 DIAGNOSIS — F329 Major depressive disorder, single episode, unspecified: Secondary | ICD-10-CM | POA: Diagnosis not present

## 2013-10-28 DIAGNOSIS — F3289 Other specified depressive episodes: Secondary | ICD-10-CM | POA: Diagnosis not present

## 2013-10-29 ENCOUNTER — Ambulatory Visit (HOSPITAL_COMMUNITY)
Admission: RE | Admit: 2013-10-29 | Discharge: 2013-10-29 | Disposition: A | Discharge status: Home / Self Care | Payer: Medicare Other | Source: Ambulatory Visit | Attending: Cardiology | Admitting: Cardiology

## 2013-10-29 DIAGNOSIS — I10 Essential (primary) hypertension: Secondary | ICD-10-CM | POA: Insufficient documentation

## 2013-10-29 DIAGNOSIS — R0602 Shortness of breath: Secondary | ICD-10-CM | POA: Diagnosis not present

## 2013-10-29 DIAGNOSIS — R011 Cardiac murmur, unspecified: Secondary | ICD-10-CM | POA: Insufficient documentation

## 2013-10-29 DIAGNOSIS — I252 Old myocardial infarction: Secondary | ICD-10-CM | POA: Diagnosis not present

## 2013-10-29 DIAGNOSIS — E785 Hyperlipidemia, unspecified: Secondary | ICD-10-CM | POA: Insufficient documentation

## 2013-10-29 DIAGNOSIS — I2510 Atherosclerotic heart disease of native coronary artery without angina pectoris: Secondary | ICD-10-CM | POA: Diagnosis not present

## 2013-10-29 DIAGNOSIS — I359 Nonrheumatic aortic valve disorder, unspecified: Secondary | ICD-10-CM

## 2013-10-29 NOTE — Progress Notes (Signed)
*  PRELIMINARY RESULTS* Echocardiogram 2D Echocardiogram has been performed.  Candice Meza 10/29/2013, 9:18 AM

## 2013-10-30 DIAGNOSIS — F3289 Other specified depressive episodes: Secondary | ICD-10-CM | POA: Diagnosis not present

## 2013-10-30 DIAGNOSIS — F329 Major depressive disorder, single episode, unspecified: Secondary | ICD-10-CM | POA: Diagnosis not present

## 2013-11-01 DIAGNOSIS — M069 Rheumatoid arthritis, unspecified: Secondary | ICD-10-CM | POA: Diagnosis not present

## 2013-11-01 DIAGNOSIS — K225 Diverticulum of esophagus, acquired: Secondary | ICD-10-CM | POA: Diagnosis not present

## 2013-11-01 DIAGNOSIS — M6281 Muscle weakness (generalized): Secondary | ICD-10-CM | POA: Diagnosis not present

## 2013-11-01 DIAGNOSIS — J189 Pneumonia, unspecified organism: Secondary | ICD-10-CM | POA: Diagnosis not present

## 2013-11-01 DIAGNOSIS — R269 Unspecified abnormalities of gait and mobility: Secondary | ICD-10-CM | POA: Diagnosis not present

## 2013-11-01 DIAGNOSIS — R279 Unspecified lack of coordination: Secondary | ICD-10-CM | POA: Diagnosis not present

## 2013-11-01 DIAGNOSIS — I259 Chronic ischemic heart disease, unspecified: Secondary | ICD-10-CM | POA: Diagnosis not present

## 2013-11-01 DIAGNOSIS — M6250 Muscle wasting and atrophy, not elsewhere classified, unspecified site: Secondary | ICD-10-CM | POA: Diagnosis not present

## 2013-11-01 DIAGNOSIS — E46 Unspecified protein-calorie malnutrition: Secondary | ICD-10-CM | POA: Diagnosis not present

## 2013-11-04 DIAGNOSIS — F3289 Other specified depressive episodes: Secondary | ICD-10-CM | POA: Diagnosis not present

## 2013-11-04 DIAGNOSIS — I259 Chronic ischemic heart disease, unspecified: Secondary | ICD-10-CM | POA: Diagnosis not present

## 2013-11-04 DIAGNOSIS — M069 Rheumatoid arthritis, unspecified: Secondary | ICD-10-CM | POA: Diagnosis not present

## 2013-11-04 DIAGNOSIS — J189 Pneumonia, unspecified organism: Secondary | ICD-10-CM | POA: Diagnosis not present

## 2013-11-04 DIAGNOSIS — M6250 Muscle wasting and atrophy, not elsewhere classified, unspecified site: Secondary | ICD-10-CM | POA: Diagnosis not present

## 2013-11-04 DIAGNOSIS — F329 Major depressive disorder, single episode, unspecified: Secondary | ICD-10-CM | POA: Diagnosis not present

## 2013-11-04 DIAGNOSIS — E46 Unspecified protein-calorie malnutrition: Secondary | ICD-10-CM | POA: Diagnosis not present

## 2013-11-04 DIAGNOSIS — K225 Diverticulum of esophagus, acquired: Secondary | ICD-10-CM | POA: Diagnosis not present

## 2013-11-05 DIAGNOSIS — M6250 Muscle wasting and atrophy, not elsewhere classified, unspecified site: Secondary | ICD-10-CM | POA: Diagnosis not present

## 2013-11-05 DIAGNOSIS — I259 Chronic ischemic heart disease, unspecified: Secondary | ICD-10-CM | POA: Diagnosis not present

## 2013-11-05 DIAGNOSIS — J189 Pneumonia, unspecified organism: Secondary | ICD-10-CM | POA: Diagnosis not present

## 2013-11-05 DIAGNOSIS — M069 Rheumatoid arthritis, unspecified: Secondary | ICD-10-CM | POA: Diagnosis not present

## 2013-11-05 DIAGNOSIS — E46 Unspecified protein-calorie malnutrition: Secondary | ICD-10-CM | POA: Diagnosis not present

## 2013-11-05 DIAGNOSIS — K225 Diverticulum of esophagus, acquired: Secondary | ICD-10-CM | POA: Diagnosis not present

## 2013-11-06 DIAGNOSIS — M069 Rheumatoid arthritis, unspecified: Secondary | ICD-10-CM | POA: Diagnosis not present

## 2013-11-06 DIAGNOSIS — M6250 Muscle wasting and atrophy, not elsewhere classified, unspecified site: Secondary | ICD-10-CM | POA: Diagnosis not present

## 2013-11-06 DIAGNOSIS — K225 Diverticulum of esophagus, acquired: Secondary | ICD-10-CM | POA: Diagnosis not present

## 2013-11-06 DIAGNOSIS — J189 Pneumonia, unspecified organism: Secondary | ICD-10-CM | POA: Diagnosis not present

## 2013-11-06 DIAGNOSIS — F3289 Other specified depressive episodes: Secondary | ICD-10-CM | POA: Diagnosis not present

## 2013-11-06 DIAGNOSIS — I259 Chronic ischemic heart disease, unspecified: Secondary | ICD-10-CM | POA: Diagnosis not present

## 2013-11-06 DIAGNOSIS — F329 Major depressive disorder, single episode, unspecified: Secondary | ICD-10-CM | POA: Diagnosis not present

## 2013-11-06 DIAGNOSIS — E46 Unspecified protein-calorie malnutrition: Secondary | ICD-10-CM | POA: Diagnosis not present

## 2013-11-07 DIAGNOSIS — R5383 Other fatigue: Secondary | ICD-10-CM | POA: Diagnosis not present

## 2013-11-07 DIAGNOSIS — B351 Tinea unguium: Secondary | ICD-10-CM | POA: Diagnosis not present

## 2013-11-07 DIAGNOSIS — E46 Unspecified protein-calorie malnutrition: Secondary | ICD-10-CM | POA: Diagnosis not present

## 2013-11-07 DIAGNOSIS — M79609 Pain in unspecified limb: Secondary | ICD-10-CM | POA: Diagnosis not present

## 2013-11-07 DIAGNOSIS — K225 Diverticulum of esophagus, acquired: Secondary | ICD-10-CM | POA: Diagnosis not present

## 2013-11-07 DIAGNOSIS — I259 Chronic ischemic heart disease, unspecified: Secondary | ICD-10-CM | POA: Diagnosis not present

## 2013-11-07 DIAGNOSIS — M069 Rheumatoid arthritis, unspecified: Secondary | ICD-10-CM | POA: Diagnosis not present

## 2013-11-07 DIAGNOSIS — J189 Pneumonia, unspecified organism: Secondary | ICD-10-CM | POA: Diagnosis not present

## 2013-11-07 DIAGNOSIS — M6250 Muscle wasting and atrophy, not elsewhere classified, unspecified site: Secondary | ICD-10-CM | POA: Diagnosis not present

## 2013-11-08 DIAGNOSIS — M6250 Muscle wasting and atrophy, not elsewhere classified, unspecified site: Secondary | ICD-10-CM | POA: Diagnosis not present

## 2013-11-08 DIAGNOSIS — I259 Chronic ischemic heart disease, unspecified: Secondary | ICD-10-CM | POA: Diagnosis not present

## 2013-11-08 DIAGNOSIS — E46 Unspecified protein-calorie malnutrition: Secondary | ICD-10-CM | POA: Diagnosis not present

## 2013-11-08 DIAGNOSIS — K225 Diverticulum of esophagus, acquired: Secondary | ICD-10-CM | POA: Diagnosis not present

## 2013-11-08 DIAGNOSIS — E785 Hyperlipidemia, unspecified: Secondary | ICD-10-CM | POA: Diagnosis not present

## 2013-11-08 DIAGNOSIS — I10 Essential (primary) hypertension: Secondary | ICD-10-CM | POA: Diagnosis not present

## 2013-11-08 DIAGNOSIS — J189 Pneumonia, unspecified organism: Secondary | ICD-10-CM | POA: Diagnosis not present

## 2013-11-08 DIAGNOSIS — M069 Rheumatoid arthritis, unspecified: Secondary | ICD-10-CM | POA: Diagnosis not present

## 2013-11-13 DIAGNOSIS — F329 Major depressive disorder, single episode, unspecified: Secondary | ICD-10-CM | POA: Diagnosis not present

## 2013-11-13 DIAGNOSIS — F3289 Other specified depressive episodes: Secondary | ICD-10-CM | POA: Diagnosis not present

## 2013-11-18 DIAGNOSIS — F3289 Other specified depressive episodes: Secondary | ICD-10-CM | POA: Diagnosis not present

## 2013-11-18 DIAGNOSIS — F329 Major depressive disorder, single episode, unspecified: Secondary | ICD-10-CM | POA: Diagnosis not present

## 2013-11-20 DIAGNOSIS — F329 Major depressive disorder, single episode, unspecified: Secondary | ICD-10-CM | POA: Diagnosis not present

## 2013-11-20 DIAGNOSIS — F3289 Other specified depressive episodes: Secondary | ICD-10-CM | POA: Diagnosis not present

## 2013-11-21 ENCOUNTER — Ambulatory Visit: Payer: Medicare Other | Admitting: Cardiology

## 2013-11-27 DIAGNOSIS — F3289 Other specified depressive episodes: Secondary | ICD-10-CM | POA: Diagnosis not present

## 2013-11-27 DIAGNOSIS — F329 Major depressive disorder, single episode, unspecified: Secondary | ICD-10-CM | POA: Diagnosis not present

## 2013-12-09 ENCOUNTER — Ambulatory Visit (INDEPENDENT_AMBULATORY_CARE_PROVIDER_SITE_OTHER): Payer: Medicare Other | Admitting: Cardiology

## 2013-12-09 ENCOUNTER — Encounter: Payer: Self-pay | Admitting: Cardiology

## 2013-12-09 VITALS — BP 114/70 | HR 60 | Ht 59.0 in | Wt 122.0 lb

## 2013-12-09 DIAGNOSIS — I2510 Atherosclerotic heart disease of native coronary artery without angina pectoris: Secondary | ICD-10-CM | POA: Diagnosis not present

## 2013-12-09 DIAGNOSIS — I10 Essential (primary) hypertension: Secondary | ICD-10-CM | POA: Diagnosis not present

## 2013-12-09 DIAGNOSIS — F329 Major depressive disorder, single episode, unspecified: Secondary | ICD-10-CM | POA: Diagnosis not present

## 2013-12-09 DIAGNOSIS — F3289 Other specified depressive episodes: Secondary | ICD-10-CM | POA: Diagnosis not present

## 2013-12-09 DIAGNOSIS — E785 Hyperlipidemia, unspecified: Secondary | ICD-10-CM

## 2013-12-09 NOTE — Progress Notes (Signed)
Clinical Summary Ms. Stiner is a 80 y.o.female seen today for follow up of the following medical problems.  1. CAD  - prior MI in 1997, non-obstructive disease by cath at that time.  - denies any chest pain  - DOE walking down hall at nursing home which is stable, + edema in legs, +orthopnea and sleeps at angle which is chronic.  - echo 2008 LVEF 60%, mild AI   2. HTN  - compliant with meds   3. Hyperlipidemia  - compliant statin  - no recent panel in our system, she reports fairly recent labs at her nursing home   4. Bronchitis  - denies any smoking history. She reports being around second hand smoke regularly.  - per NP [facility] note patient with worsening SOB and cough at last visit, seems she had not been receiving her inhalers regularly at her nursing home.  - CXR was ordred at that time but does not appear it was completed  - she states inhalers given regularly now, with some improvement with SOB. Productive cough at night, productive and clear.   5. Dyspnea - since last visit completed echo, showed LVEF 55-60% with grade I diastolic dysfunction.   Past Medical History  Diagnosis Date  . MI, acute, non ST segment elevation 1997    Questionable lesion in the circumflex at catheterization; normal EF; chronic dyspnea without specific cause identified  . Hyperlipidemia   . Hypertension   . Postmenopausal     Estrogen replacement therapy  . Cough     Prolonged, possible asthmatic bronchitis  . Benign essential tremor   . Osteopenia     Versus osteoporosis  . Macular degeneration   . Degenerative joint disease     versus rheumatoid arthritis  . Repeated falls      Allergies  Allergen Reactions  . Aspirin     GI symptoms  . Bee Venom   . Morphine And Related   . Sulfa Antibiotics     dyspnea  . Tetracyclines & Related   . Codeine Rash  . Pamelor Rash  . Penicillins Rash     Current Outpatient Prescriptions  Medication Sig Dispense Refill  .  acetaminophen (TYLENOL) 650 MG CR tablet Take 1,300 mg by mouth 2 (two) times daily as needed. For pain      . albuterol (PROVENTIL) (2.5 MG/3ML) 0.083% nebulizer solution Take 2.5 mg by nebulization every 8 (eight) hours.       . ALPRAZolam (XANAX) 0.25 MG tablet Take 0.25 mg by mouth every 8 (eight) hours as needed. For anxiety      . aluminum & magnesium hydroxide-simethicone (MYLANTA) 500-450-40 MG/5ML suspension Take 15 mLs by mouth at bedtime.      [name] aspirin 81 MG tablet Take 81 mg by mouth daily.        . beclomethasone (QVAR) 80 MCG/ACT inhaler Inhale 1 puff into the lungs 2 (two) times daily.        . Calcium Carb-Cholecalciferol (CALCIUM 500 +D) 500-400 MG-UNIT TABS Take 1 tablet by mouth daily.      . cetirizine (ZYRTEC) 10 MG tablet Take 10 mg by mouth daily.        [name] dextromethorphan (DELSYM) 30 MG/5ML liquid Take 60 mg by mouth 2 (two) times daily as needed. For  cough      . fluticasone (FLONASE) 50 MCG/ACT nasal spray Place 2 sprays into the nose daily.        . furosemide (LASIX)  40 MG tablet Take 40 mg by mouth as needed.      . furosemide (LASIX) 40 MG tablet Take 1 tablet (40 mg total) by mouth daily.  90 tablet  3  . GUAIFENESIN PO Take 600 mg by mouth 2 (two) times daily.      [name] ipratropium-albuterol (DUONEB) 0.5-2.5 (3) MG/3ML SOLN Take 3 mLs by nebulization every 2 (two) hours as needed.      . Multiple Vitamin (MULTIVITAMIN) tablet Take 1 tablet by mouth daily.        . nabumetone (RELAFEN) 500 MG tablet Take 500 mg by mouth daily.       . nitroGLYCERIN (NITROSTAT) 0.4 MG SL tablet Place 0.4 mg under the tongue as directed.        . NON FORMULARY Guar Gum Powder - Benefiber Sugar Free (guar gum) Powder 30 Milliters by mouth twice daily for constipation in 8 oz water      . omeprazole (PRILOSEC) 40 MG capsule Take 40 mg by mouth daily.        Bertram Gala Glycol-Propyl Glycol (SYSTANE) 0.4-0.3 % SOLN Apply 1 drop to eye 3 (three) times daily.        . polyethylene glycol  powder (GLYCOLAX/MIRALAX) powder Take 17 g by mouth daily.      . potassium chloride (K-DUR) 10 MEQ tablet Take 20 mEq by mouth daily.      . pravastatin (PRAVACHOL) 40 MG tablet Take 40 mg by mouth at bedtime.        . promethazine (PHENERGAN) 25 MG tablet Take 12.5 mg by mouth every 4 (four) hours as needed. For nausea      . traMADol (ULTRAM) 50 MG tablet Take 50 mg by mouth every 6 (six) hours as needed. For pain      . vitamin E 400 UNIT capsule Take 400 Units by mouth daily.         No current facility-administered medications for this visit.     Past Surgical History  Procedure Laterality Date  . Bladder suspension    . Inguinal hernia repair      Left  . Foot surgery      Bilateral  . Ercp  04/18/2012    Procedure: ENDOSCOPIC RETROGRADE CHOLANGIOPANCREATOGRAPHY (ERCP);  Surgeon: Malissa Hippo, MD;  Location: [facility];  Service: Endoscopy;  Laterality: N/A;  Diagnostic     Allergies  Allergen Reactions  . Aspirin     GI symptoms  . Bee Venom   . Morphine And Related   . Sulfa Antibiotics     dyspnea  . Tetracyclines & Related   . Codeine Rash  . Pamelor Rash  . Penicillins Rash      Family History  Problem Relation Age of Onset  . Coronary artery disease    . Stroke       Social History Ms. Depetro reports that she has never smoked. She has never used smokeless tobacco. Ms. Swartzlander reports that she does not drink alcohol.   Review of Systems CONSTITUTIONAL: No weight loss, fever, chills, weakness or fatigue.  HEENT: Eyes: No visual loss, blurred vision, double vision or yellow sclerae.No hearing loss, sneezing, congestion, runny nose or sore throat.  SKIN: No rash or itching.  CARDIOVASCULAR: per HPI RESPIRATORY: per HPI  GASTROINTESTINAL: No anorexia, nausea, vomiting or diarrhea. No abdominal pain or blood.  GENITOURINARY: No burning on urination, no polyuria NEUROLOGICAL: No headache, dizziness, syncope, paralysis, ataxia, numbness or  tingling in the extremities.  No change in bowel or bladder control.  MUSCULOSKELETAL: No muscle, back pain, joint pain or stiffness.  LYMPHATICS: No enlarged nodes. No history of splenectomy.  PSYCHIATRIC: No history of depression or anxiety.  ENDOCRINOLOGIC: No reports of sweating, cold or heat intolerance. No polyuria or polydipsia.  [name]   Physical Examination p 60 bp 114/70 Wt 122 lbs BMI 25 Gen: resting comfortably, no acute distress HEENT: no scleral icterus, pupils equal round and reactive, no palptable cervical adenopathy,  CV: RRR, no m/r/g, no JVD Resp: Clear to auscultation bilaterally GI: abdomen is soft, non-tender, non-distended, normal bowel sounds, no hepatosplenomegaly MSK: extremities are warm, no edema.  Skin: warm, no rash Neuro:  no focal deficits Psych: appropriate affect   Diagnostic Studies 10/29/13 Echo Study Conclusions  - Left ventricle: The cavity size was normal. Wall thickness was increased in a pattern of mild LVH. Systolic function was normal. The estimated ejection fraction was in the range of 55% to 60%. Wall motion was normal; there were no regional wall motion abnormalities. There was an increased relative contribution of atrial contraction to ventricular filling. Doppler parameters are consistent with abnormal left ventricular relaxation (grade 1 diastolic dysfunction). - Aortic valve: Mildly calcified annulus. Trileaflet; mildly thickened, mildly calcified leaflets. Moderate, centralregurgitation. Minimal aortic stenosis. Peak velocity: 205cm/s (S). Mean gradient: 51mm Hg (S). Valve area: 1.85cm^2(VTI). - Mitral valve: Calcified annulus. Mildly thickened leaflets . Mild regurgitation. - Left atrium: The atrium was mildly dilated. - Tricuspid valve: Mildly thickened leaflets. Mild-moderate regurgitation. - Pulmonary arteries: PA peak pressure: 57mm Hg (S). Mild to moderately elevated pulmonary pressures.      Assessment and Plan  1.  CAD  - no current chest pain  - continue current meds   2. HTN  - at goal, continue current meds    3. Hyperlipidemia  - will request labs from her nursing home, continue current statin for now         Antoine Poche, M.D., F.A.C.C.

## 2013-12-09 NOTE — Patient Instructions (Signed)
Continue all current medications. Your physician wants you to follow up in: 6 months.  You will receive a reminder letter in the mail one-two months in advance.  If you don't receive a letter, please call our office to schedule the follow up appointment   

## 2013-12-10 DIAGNOSIS — J189 Pneumonia, unspecified organism: Secondary | ICD-10-CM | POA: Diagnosis not present

## 2013-12-10 DIAGNOSIS — J986 Disorders of diaphragm: Secondary | ICD-10-CM | POA: Diagnosis not present

## 2013-12-20 DIAGNOSIS — F3289 Other specified depressive episodes: Secondary | ICD-10-CM | POA: Diagnosis not present

## 2013-12-20 DIAGNOSIS — F329 Major depressive disorder, single episode, unspecified: Secondary | ICD-10-CM | POA: Diagnosis not present

## 2013-12-24 DIAGNOSIS — F3289 Other specified depressive episodes: Secondary | ICD-10-CM | POA: Diagnosis not present

## 2013-12-24 DIAGNOSIS — F329 Major depressive disorder, single episode, unspecified: Secondary | ICD-10-CM | POA: Diagnosis not present

## 2013-12-29 DIAGNOSIS — R059 Cough, unspecified: Secondary | ICD-10-CM | POA: Diagnosis not present

## 2013-12-29 DIAGNOSIS — R05 Cough: Secondary | ICD-10-CM | POA: Diagnosis not present

## 2013-12-29 DIAGNOSIS — F411 Generalized anxiety disorder: Secondary | ICD-10-CM | POA: Diagnosis not present

## 2013-12-30 DIAGNOSIS — F3289 Other specified depressive episodes: Secondary | ICD-10-CM | POA: Diagnosis not present

## 2013-12-30 DIAGNOSIS — F329 Major depressive disorder, single episode, unspecified: Secondary | ICD-10-CM | POA: Diagnosis not present

## 2014-01-01 DIAGNOSIS — F329 Major depressive disorder, single episode, unspecified: Secondary | ICD-10-CM | POA: Diagnosis not present

## 2014-01-01 DIAGNOSIS — F3289 Other specified depressive episodes: Secondary | ICD-10-CM | POA: Diagnosis not present

## 2014-01-07 DIAGNOSIS — F329 Major depressive disorder, single episode, unspecified: Secondary | ICD-10-CM | POA: Diagnosis not present

## 2014-01-07 DIAGNOSIS — F3289 Other specified depressive episodes: Secondary | ICD-10-CM | POA: Diagnosis not present

## 2014-01-09 DIAGNOSIS — F3289 Other specified depressive episodes: Secondary | ICD-10-CM | POA: Diagnosis not present

## 2014-01-09 DIAGNOSIS — F329 Major depressive disorder, single episode, unspecified: Secondary | ICD-10-CM | POA: Diagnosis not present

## 2014-01-13 DIAGNOSIS — I517 Cardiomegaly: Secondary | ICD-10-CM | POA: Diagnosis not present

## 2014-01-13 DIAGNOSIS — K449 Diaphragmatic hernia without obstruction or gangrene: Secondary | ICD-10-CM | POA: Diagnosis not present

## 2014-01-14 DIAGNOSIS — F3289 Other specified depressive episodes: Secondary | ICD-10-CM | POA: Diagnosis not present

## 2014-01-14 DIAGNOSIS — F329 Major depressive disorder, single episode, unspecified: Secondary | ICD-10-CM | POA: Diagnosis not present

## 2014-01-16 DIAGNOSIS — F329 Major depressive disorder, single episode, unspecified: Secondary | ICD-10-CM | POA: Diagnosis not present

## 2014-01-16 DIAGNOSIS — F3289 Other specified depressive episodes: Secondary | ICD-10-CM | POA: Diagnosis not present

## 2014-01-19 DIAGNOSIS — F329 Major depressive disorder, single episode, unspecified: Secondary | ICD-10-CM | POA: Diagnosis not present

## 2014-01-19 DIAGNOSIS — F3289 Other specified depressive episodes: Secondary | ICD-10-CM | POA: Diagnosis not present

## 2014-01-20 DIAGNOSIS — R269 Unspecified abnormalities of gait and mobility: Secondary | ICD-10-CM | POA: Diagnosis not present

## 2014-01-20 DIAGNOSIS — M6250 Muscle wasting and atrophy, not elsewhere classified, unspecified site: Secondary | ICD-10-CM | POA: Diagnosis not present

## 2014-01-20 DIAGNOSIS — E46 Unspecified protein-calorie malnutrition: Secondary | ICD-10-CM | POA: Diagnosis not present

## 2014-01-20 DIAGNOSIS — J189 Pneumonia, unspecified organism: Secondary | ICD-10-CM | POA: Diagnosis not present

## 2014-01-20 DIAGNOSIS — I259 Chronic ischemic heart disease, unspecified: Secondary | ICD-10-CM | POA: Diagnosis not present

## 2014-01-20 DIAGNOSIS — R1312 Dysphagia, oropharyngeal phase: Secondary | ICD-10-CM | POA: Diagnosis not present

## 2014-01-20 DIAGNOSIS — M069 Rheumatoid arthritis, unspecified: Secondary | ICD-10-CM | POA: Diagnosis not present

## 2014-01-20 DIAGNOSIS — R279 Unspecified lack of coordination: Secondary | ICD-10-CM | POA: Diagnosis not present

## 2014-01-20 DIAGNOSIS — K225 Diverticulum of esophagus, acquired: Secondary | ICD-10-CM | POA: Diagnosis not present

## 2014-01-21 DIAGNOSIS — I259 Chronic ischemic heart disease, unspecified: Secondary | ICD-10-CM | POA: Diagnosis not present

## 2014-01-21 DIAGNOSIS — M069 Rheumatoid arthritis, unspecified: Secondary | ICD-10-CM | POA: Diagnosis not present

## 2014-01-21 DIAGNOSIS — M6250 Muscle wasting and atrophy, not elsewhere classified, unspecified site: Secondary | ICD-10-CM | POA: Diagnosis not present

## 2014-01-21 DIAGNOSIS — E46 Unspecified protein-calorie malnutrition: Secondary | ICD-10-CM | POA: Diagnosis not present

## 2014-01-21 DIAGNOSIS — F3289 Other specified depressive episodes: Secondary | ICD-10-CM | POA: Diagnosis not present

## 2014-01-21 DIAGNOSIS — K225 Diverticulum of esophagus, acquired: Secondary | ICD-10-CM | POA: Diagnosis not present

## 2014-01-21 DIAGNOSIS — J189 Pneumonia, unspecified organism: Secondary | ICD-10-CM | POA: Diagnosis not present

## 2014-01-21 DIAGNOSIS — F329 Major depressive disorder, single episode, unspecified: Secondary | ICD-10-CM | POA: Diagnosis not present

## 2014-01-22 DIAGNOSIS — I259 Chronic ischemic heart disease, unspecified: Secondary | ICD-10-CM | POA: Diagnosis not present

## 2014-01-22 DIAGNOSIS — M6250 Muscle wasting and atrophy, not elsewhere classified, unspecified site: Secondary | ICD-10-CM | POA: Diagnosis not present

## 2014-01-22 DIAGNOSIS — J189 Pneumonia, unspecified organism: Secondary | ICD-10-CM | POA: Diagnosis not present

## 2014-01-22 DIAGNOSIS — E46 Unspecified protein-calorie malnutrition: Secondary | ICD-10-CM | POA: Diagnosis not present

## 2014-01-22 DIAGNOSIS — M069 Rheumatoid arthritis, unspecified: Secondary | ICD-10-CM | POA: Diagnosis not present

## 2014-01-22 DIAGNOSIS — K225 Diverticulum of esophagus, acquired: Secondary | ICD-10-CM | POA: Diagnosis not present

## 2014-01-23 DIAGNOSIS — E46 Unspecified protein-calorie malnutrition: Secondary | ICD-10-CM | POA: Diagnosis not present

## 2014-01-23 DIAGNOSIS — M069 Rheumatoid arthritis, unspecified: Secondary | ICD-10-CM | POA: Diagnosis not present

## 2014-01-23 DIAGNOSIS — I259 Chronic ischemic heart disease, unspecified: Secondary | ICD-10-CM | POA: Diagnosis not present

## 2014-01-23 DIAGNOSIS — Z9849 Cataract extraction status, unspecified eye: Secondary | ICD-10-CM | POA: Diagnosis not present

## 2014-01-23 DIAGNOSIS — M6250 Muscle wasting and atrophy, not elsewhere classified, unspecified site: Secondary | ICD-10-CM | POA: Diagnosis not present

## 2014-01-23 DIAGNOSIS — Z961 Presence of intraocular lens: Secondary | ICD-10-CM | POA: Diagnosis not present

## 2014-01-23 DIAGNOSIS — H31109 Choroidal degeneration, unspecified, unspecified eye: Secondary | ICD-10-CM | POA: Diagnosis not present

## 2014-01-23 DIAGNOSIS — H02409 Unspecified ptosis of unspecified eyelid: Secondary | ICD-10-CM | POA: Diagnosis not present

## 2014-01-23 DIAGNOSIS — K225 Diverticulum of esophagus, acquired: Secondary | ICD-10-CM | POA: Diagnosis not present

## 2014-01-23 DIAGNOSIS — J189 Pneumonia, unspecified organism: Secondary | ICD-10-CM | POA: Diagnosis not present

## 2014-01-24 DIAGNOSIS — M069 Rheumatoid arthritis, unspecified: Secondary | ICD-10-CM | POA: Diagnosis not present

## 2014-01-24 DIAGNOSIS — I259 Chronic ischemic heart disease, unspecified: Secondary | ICD-10-CM | POA: Diagnosis not present

## 2014-01-24 DIAGNOSIS — M6250 Muscle wasting and atrophy, not elsewhere classified, unspecified site: Secondary | ICD-10-CM | POA: Diagnosis not present

## 2014-01-24 DIAGNOSIS — K225 Diverticulum of esophagus, acquired: Secondary | ICD-10-CM | POA: Diagnosis not present

## 2014-01-24 DIAGNOSIS — E46 Unspecified protein-calorie malnutrition: Secondary | ICD-10-CM | POA: Diagnosis not present

## 2014-01-24 DIAGNOSIS — J189 Pneumonia, unspecified organism: Secondary | ICD-10-CM | POA: Diagnosis not present

## 2014-01-26 DIAGNOSIS — F3289 Other specified depressive episodes: Secondary | ICD-10-CM | POA: Diagnosis not present

## 2014-01-26 DIAGNOSIS — F329 Major depressive disorder, single episode, unspecified: Secondary | ICD-10-CM | POA: Diagnosis not present

## 2014-01-27 DIAGNOSIS — E46 Unspecified protein-calorie malnutrition: Secondary | ICD-10-CM | POA: Diagnosis not present

## 2014-01-27 DIAGNOSIS — M069 Rheumatoid arthritis, unspecified: Secondary | ICD-10-CM | POA: Diagnosis not present

## 2014-01-27 DIAGNOSIS — K225 Diverticulum of esophagus, acquired: Secondary | ICD-10-CM | POA: Diagnosis not present

## 2014-01-27 DIAGNOSIS — M6250 Muscle wasting and atrophy, not elsewhere classified, unspecified site: Secondary | ICD-10-CM | POA: Diagnosis not present

## 2014-01-27 DIAGNOSIS — I259 Chronic ischemic heart disease, unspecified: Secondary | ICD-10-CM | POA: Diagnosis not present

## 2014-01-27 DIAGNOSIS — J189 Pneumonia, unspecified organism: Secondary | ICD-10-CM | POA: Diagnosis not present

## 2014-01-28 DIAGNOSIS — M6250 Muscle wasting and atrophy, not elsewhere classified, unspecified site: Secondary | ICD-10-CM | POA: Diagnosis not present

## 2014-01-28 DIAGNOSIS — B351 Tinea unguium: Secondary | ICD-10-CM | POA: Diagnosis not present

## 2014-01-28 DIAGNOSIS — I259 Chronic ischemic heart disease, unspecified: Secondary | ICD-10-CM | POA: Diagnosis not present

## 2014-01-28 DIAGNOSIS — F3289 Other specified depressive episodes: Secondary | ICD-10-CM | POA: Diagnosis not present

## 2014-01-28 DIAGNOSIS — J189 Pneumonia, unspecified organism: Secondary | ICD-10-CM | POA: Diagnosis not present

## 2014-01-28 DIAGNOSIS — M79609 Pain in unspecified limb: Secondary | ICD-10-CM | POA: Diagnosis not present

## 2014-01-28 DIAGNOSIS — E46 Unspecified protein-calorie malnutrition: Secondary | ICD-10-CM | POA: Diagnosis not present

## 2014-01-28 DIAGNOSIS — F329 Major depressive disorder, single episode, unspecified: Secondary | ICD-10-CM | POA: Diagnosis not present

## 2014-01-28 DIAGNOSIS — K225 Diverticulum of esophagus, acquired: Secondary | ICD-10-CM | POA: Diagnosis not present

## 2014-01-28 DIAGNOSIS — M069 Rheumatoid arthritis, unspecified: Secondary | ICD-10-CM | POA: Diagnosis not present

## 2014-01-29 DIAGNOSIS — M6250 Muscle wasting and atrophy, not elsewhere classified, unspecified site: Secondary | ICD-10-CM | POA: Diagnosis not present

## 2014-01-29 DIAGNOSIS — I259 Chronic ischemic heart disease, unspecified: Secondary | ICD-10-CM | POA: Diagnosis not present

## 2014-01-29 DIAGNOSIS — K225 Diverticulum of esophagus, acquired: Secondary | ICD-10-CM | POA: Diagnosis not present

## 2014-01-29 DIAGNOSIS — J189 Pneumonia, unspecified organism: Secondary | ICD-10-CM | POA: Diagnosis not present

## 2014-01-29 DIAGNOSIS — M069 Rheumatoid arthritis, unspecified: Secondary | ICD-10-CM | POA: Diagnosis not present

## 2014-01-29 DIAGNOSIS — E46 Unspecified protein-calorie malnutrition: Secondary | ICD-10-CM | POA: Diagnosis not present

## 2014-01-30 DIAGNOSIS — I259 Chronic ischemic heart disease, unspecified: Secondary | ICD-10-CM | POA: Diagnosis not present

## 2014-01-30 DIAGNOSIS — E46 Unspecified protein-calorie malnutrition: Secondary | ICD-10-CM | POA: Diagnosis not present

## 2014-01-30 DIAGNOSIS — K225 Diverticulum of esophagus, acquired: Secondary | ICD-10-CM | POA: Diagnosis not present

## 2014-01-30 DIAGNOSIS — J189 Pneumonia, unspecified organism: Secondary | ICD-10-CM | POA: Diagnosis not present

## 2014-01-30 DIAGNOSIS — M069 Rheumatoid arthritis, unspecified: Secondary | ICD-10-CM | POA: Diagnosis not present

## 2014-01-30 DIAGNOSIS — M6250 Muscle wasting and atrophy, not elsewhere classified, unspecified site: Secondary | ICD-10-CM | POA: Diagnosis not present

## 2014-01-31 DIAGNOSIS — I259 Chronic ischemic heart disease, unspecified: Secondary | ICD-10-CM | POA: Diagnosis not present

## 2014-01-31 DIAGNOSIS — E46 Unspecified protein-calorie malnutrition: Secondary | ICD-10-CM | POA: Diagnosis not present

## 2014-01-31 DIAGNOSIS — M069 Rheumatoid arthritis, unspecified: Secondary | ICD-10-CM | POA: Diagnosis not present

## 2014-01-31 DIAGNOSIS — K225 Diverticulum of esophagus, acquired: Secondary | ICD-10-CM | POA: Diagnosis not present

## 2014-01-31 DIAGNOSIS — M6250 Muscle wasting and atrophy, not elsewhere classified, unspecified site: Secondary | ICD-10-CM | POA: Diagnosis not present

## 2014-01-31 DIAGNOSIS — J189 Pneumonia, unspecified organism: Secondary | ICD-10-CM | POA: Diagnosis not present

## 2014-02-03 DIAGNOSIS — M6250 Muscle wasting and atrophy, not elsewhere classified, unspecified site: Secondary | ICD-10-CM | POA: Diagnosis not present

## 2014-02-03 DIAGNOSIS — E46 Unspecified protein-calorie malnutrition: Secondary | ICD-10-CM | POA: Diagnosis not present

## 2014-02-03 DIAGNOSIS — I259 Chronic ischemic heart disease, unspecified: Secondary | ICD-10-CM | POA: Diagnosis not present

## 2014-02-03 DIAGNOSIS — R269 Unspecified abnormalities of gait and mobility: Secondary | ICD-10-CM | POA: Diagnosis not present

## 2014-02-03 DIAGNOSIS — K225 Diverticulum of esophagus, acquired: Secondary | ICD-10-CM | POA: Diagnosis not present

## 2014-02-03 DIAGNOSIS — R279 Unspecified lack of coordination: Secondary | ICD-10-CM | POA: Diagnosis not present

## 2014-02-03 DIAGNOSIS — I10 Essential (primary) hypertension: Secondary | ICD-10-CM | POA: Diagnosis not present

## 2014-02-03 DIAGNOSIS — J189 Pneumonia, unspecified organism: Secondary | ICD-10-CM | POA: Diagnosis not present

## 2014-02-03 DIAGNOSIS — M069 Rheumatoid arthritis, unspecified: Secondary | ICD-10-CM | POA: Diagnosis not present

## 2014-02-04 DIAGNOSIS — E46 Unspecified protein-calorie malnutrition: Secondary | ICD-10-CM | POA: Diagnosis not present

## 2014-02-04 DIAGNOSIS — M069 Rheumatoid arthritis, unspecified: Secondary | ICD-10-CM | POA: Diagnosis not present

## 2014-02-04 DIAGNOSIS — J189 Pneumonia, unspecified organism: Secondary | ICD-10-CM | POA: Diagnosis not present

## 2014-02-04 DIAGNOSIS — M6250 Muscle wasting and atrophy, not elsewhere classified, unspecified site: Secondary | ICD-10-CM | POA: Diagnosis not present

## 2014-02-04 DIAGNOSIS — F329 Major depressive disorder, single episode, unspecified: Secondary | ICD-10-CM | POA: Diagnosis not present

## 2014-02-04 DIAGNOSIS — I259 Chronic ischemic heart disease, unspecified: Secondary | ICD-10-CM | POA: Diagnosis not present

## 2014-02-04 DIAGNOSIS — I10 Essential (primary) hypertension: Secondary | ICD-10-CM | POA: Diagnosis not present

## 2014-02-04 DIAGNOSIS — F3289 Other specified depressive episodes: Secondary | ICD-10-CM | POA: Diagnosis not present

## 2014-02-05 DIAGNOSIS — M6250 Muscle wasting and atrophy, not elsewhere classified, unspecified site: Secondary | ICD-10-CM | POA: Diagnosis not present

## 2014-02-05 DIAGNOSIS — M069 Rheumatoid arthritis, unspecified: Secondary | ICD-10-CM | POA: Diagnosis not present

## 2014-02-05 DIAGNOSIS — I10 Essential (primary) hypertension: Secondary | ICD-10-CM | POA: Diagnosis not present

## 2014-02-05 DIAGNOSIS — J189 Pneumonia, unspecified organism: Secondary | ICD-10-CM | POA: Diagnosis not present

## 2014-02-05 DIAGNOSIS — E46 Unspecified protein-calorie malnutrition: Secondary | ICD-10-CM | POA: Diagnosis not present

## 2014-02-05 DIAGNOSIS — I259 Chronic ischemic heart disease, unspecified: Secondary | ICD-10-CM | POA: Diagnosis not present

## 2014-02-06 DIAGNOSIS — J189 Pneumonia, unspecified organism: Secondary | ICD-10-CM | POA: Diagnosis not present

## 2014-02-06 DIAGNOSIS — I10 Essential (primary) hypertension: Secondary | ICD-10-CM | POA: Diagnosis not present

## 2014-02-06 DIAGNOSIS — M069 Rheumatoid arthritis, unspecified: Secondary | ICD-10-CM | POA: Diagnosis not present

## 2014-02-06 DIAGNOSIS — I259 Chronic ischemic heart disease, unspecified: Secondary | ICD-10-CM | POA: Diagnosis not present

## 2014-02-06 DIAGNOSIS — F329 Major depressive disorder, single episode, unspecified: Secondary | ICD-10-CM | POA: Diagnosis not present

## 2014-02-06 DIAGNOSIS — M6250 Muscle wasting and atrophy, not elsewhere classified, unspecified site: Secondary | ICD-10-CM | POA: Diagnosis not present

## 2014-02-06 DIAGNOSIS — F3289 Other specified depressive episodes: Secondary | ICD-10-CM | POA: Diagnosis not present

## 2014-02-06 DIAGNOSIS — E46 Unspecified protein-calorie malnutrition: Secondary | ICD-10-CM | POA: Diagnosis not present

## 2014-02-07 DIAGNOSIS — I259 Chronic ischemic heart disease, unspecified: Secondary | ICD-10-CM | POA: Diagnosis not present

## 2014-02-07 DIAGNOSIS — J189 Pneumonia, unspecified organism: Secondary | ICD-10-CM | POA: Diagnosis not present

## 2014-02-07 DIAGNOSIS — I10 Essential (primary) hypertension: Secondary | ICD-10-CM | POA: Diagnosis not present

## 2014-02-07 DIAGNOSIS — M069 Rheumatoid arthritis, unspecified: Secondary | ICD-10-CM | POA: Diagnosis not present

## 2014-02-07 DIAGNOSIS — M6250 Muscle wasting and atrophy, not elsewhere classified, unspecified site: Secondary | ICD-10-CM | POA: Diagnosis not present

## 2014-02-07 DIAGNOSIS — E46 Unspecified protein-calorie malnutrition: Secondary | ICD-10-CM | POA: Diagnosis not present

## 2014-02-09 DIAGNOSIS — F3289 Other specified depressive episodes: Secondary | ICD-10-CM | POA: Diagnosis not present

## 2014-02-09 DIAGNOSIS — J189 Pneumonia, unspecified organism: Secondary | ICD-10-CM | POA: Diagnosis not present

## 2014-02-09 DIAGNOSIS — G4700 Insomnia, unspecified: Secondary | ICD-10-CM | POA: Diagnosis not present

## 2014-02-09 DIAGNOSIS — F329 Major depressive disorder, single episode, unspecified: Secondary | ICD-10-CM | POA: Diagnosis not present

## 2014-02-09 DIAGNOSIS — M6250 Muscle wasting and atrophy, not elsewhere classified, unspecified site: Secondary | ICD-10-CM | POA: Diagnosis not present

## 2014-02-09 DIAGNOSIS — F411 Generalized anxiety disorder: Secondary | ICD-10-CM | POA: Diagnosis not present

## 2014-02-09 DIAGNOSIS — M069 Rheumatoid arthritis, unspecified: Secondary | ICD-10-CM | POA: Diagnosis not present

## 2014-02-09 DIAGNOSIS — I259 Chronic ischemic heart disease, unspecified: Secondary | ICD-10-CM | POA: Diagnosis not present

## 2014-02-09 DIAGNOSIS — I10 Essential (primary) hypertension: Secondary | ICD-10-CM | POA: Diagnosis not present

## 2014-02-09 DIAGNOSIS — E46 Unspecified protein-calorie malnutrition: Secondary | ICD-10-CM | POA: Diagnosis not present

## 2014-02-10 DIAGNOSIS — I10 Essential (primary) hypertension: Secondary | ICD-10-CM | POA: Diagnosis not present

## 2014-02-10 DIAGNOSIS — E46 Unspecified protein-calorie malnutrition: Secondary | ICD-10-CM | POA: Diagnosis not present

## 2014-02-10 DIAGNOSIS — M6250 Muscle wasting and atrophy, not elsewhere classified, unspecified site: Secondary | ICD-10-CM | POA: Diagnosis not present

## 2014-02-10 DIAGNOSIS — I259 Chronic ischemic heart disease, unspecified: Secondary | ICD-10-CM | POA: Diagnosis not present

## 2014-02-10 DIAGNOSIS — M069 Rheumatoid arthritis, unspecified: Secondary | ICD-10-CM | POA: Diagnosis not present

## 2014-02-10 DIAGNOSIS — J189 Pneumonia, unspecified organism: Secondary | ICD-10-CM | POA: Diagnosis not present

## 2014-02-11 DIAGNOSIS — F3289 Other specified depressive episodes: Secondary | ICD-10-CM | POA: Diagnosis not present

## 2014-02-11 DIAGNOSIS — F329 Major depressive disorder, single episode, unspecified: Secondary | ICD-10-CM | POA: Diagnosis not present

## 2014-02-12 DIAGNOSIS — I10 Essential (primary) hypertension: Secondary | ICD-10-CM | POA: Diagnosis not present

## 2014-02-12 DIAGNOSIS — J189 Pneumonia, unspecified organism: Secondary | ICD-10-CM | POA: Diagnosis not present

## 2014-02-12 DIAGNOSIS — M069 Rheumatoid arthritis, unspecified: Secondary | ICD-10-CM | POA: Diagnosis not present

## 2014-02-12 DIAGNOSIS — E46 Unspecified protein-calorie malnutrition: Secondary | ICD-10-CM | POA: Diagnosis not present

## 2014-02-12 DIAGNOSIS — I259 Chronic ischemic heart disease, unspecified: Secondary | ICD-10-CM | POA: Diagnosis not present

## 2014-02-12 DIAGNOSIS — M6250 Muscle wasting and atrophy, not elsewhere classified, unspecified site: Secondary | ICD-10-CM | POA: Diagnosis not present

## 2014-02-14 DIAGNOSIS — I259 Chronic ischemic heart disease, unspecified: Secondary | ICD-10-CM | POA: Diagnosis not present

## 2014-02-14 DIAGNOSIS — I10 Essential (primary) hypertension: Secondary | ICD-10-CM | POA: Diagnosis not present

## 2014-02-14 DIAGNOSIS — M069 Rheumatoid arthritis, unspecified: Secondary | ICD-10-CM | POA: Diagnosis not present

## 2014-02-14 DIAGNOSIS — J189 Pneumonia, unspecified organism: Secondary | ICD-10-CM | POA: Diagnosis not present

## 2014-02-14 DIAGNOSIS — M6250 Muscle wasting and atrophy, not elsewhere classified, unspecified site: Secondary | ICD-10-CM | POA: Diagnosis not present

## 2014-02-14 DIAGNOSIS — E46 Unspecified protein-calorie malnutrition: Secondary | ICD-10-CM | POA: Diagnosis not present

## 2014-02-15 DIAGNOSIS — I259 Chronic ischemic heart disease, unspecified: Secondary | ICD-10-CM | POA: Diagnosis not present

## 2014-02-15 DIAGNOSIS — J189 Pneumonia, unspecified organism: Secondary | ICD-10-CM | POA: Diagnosis not present

## 2014-02-15 DIAGNOSIS — I10 Essential (primary) hypertension: Secondary | ICD-10-CM | POA: Diagnosis not present

## 2014-02-15 DIAGNOSIS — E46 Unspecified protein-calorie malnutrition: Secondary | ICD-10-CM | POA: Diagnosis not present

## 2014-02-15 DIAGNOSIS — M6250 Muscle wasting and atrophy, not elsewhere classified, unspecified site: Secondary | ICD-10-CM | POA: Diagnosis not present

## 2014-02-15 DIAGNOSIS — M069 Rheumatoid arthritis, unspecified: Secondary | ICD-10-CM | POA: Diagnosis not present

## 2014-02-17 DIAGNOSIS — I259 Chronic ischemic heart disease, unspecified: Secondary | ICD-10-CM | POA: Diagnosis not present

## 2014-02-17 DIAGNOSIS — M069 Rheumatoid arthritis, unspecified: Secondary | ICD-10-CM | POA: Diagnosis not present

## 2014-02-17 DIAGNOSIS — F3289 Other specified depressive episodes: Secondary | ICD-10-CM | POA: Diagnosis not present

## 2014-02-17 DIAGNOSIS — M6250 Muscle wasting and atrophy, not elsewhere classified, unspecified site: Secondary | ICD-10-CM | POA: Diagnosis not present

## 2014-02-17 DIAGNOSIS — E46 Unspecified protein-calorie malnutrition: Secondary | ICD-10-CM | POA: Diagnosis not present

## 2014-02-17 DIAGNOSIS — J189 Pneumonia, unspecified organism: Secondary | ICD-10-CM | POA: Diagnosis not present

## 2014-02-17 DIAGNOSIS — F329 Major depressive disorder, single episode, unspecified: Secondary | ICD-10-CM | POA: Diagnosis not present

## 2014-02-17 DIAGNOSIS — I10 Essential (primary) hypertension: Secondary | ICD-10-CM | POA: Diagnosis not present

## 2014-02-18 DIAGNOSIS — I10 Essential (primary) hypertension: Secondary | ICD-10-CM | POA: Diagnosis not present

## 2014-02-18 DIAGNOSIS — I259 Chronic ischemic heart disease, unspecified: Secondary | ICD-10-CM | POA: Diagnosis not present

## 2014-02-18 DIAGNOSIS — M069 Rheumatoid arthritis, unspecified: Secondary | ICD-10-CM | POA: Diagnosis not present

## 2014-02-18 DIAGNOSIS — E46 Unspecified protein-calorie malnutrition: Secondary | ICD-10-CM | POA: Diagnosis not present

## 2014-02-18 DIAGNOSIS — M6250 Muscle wasting and atrophy, not elsewhere classified, unspecified site: Secondary | ICD-10-CM | POA: Diagnosis not present

## 2014-02-18 DIAGNOSIS — J189 Pneumonia, unspecified organism: Secondary | ICD-10-CM | POA: Diagnosis not present

## 2014-02-19 DIAGNOSIS — M6250 Muscle wasting and atrophy, not elsewhere classified, unspecified site: Secondary | ICD-10-CM | POA: Diagnosis not present

## 2014-02-19 DIAGNOSIS — F3289 Other specified depressive episodes: Secondary | ICD-10-CM | POA: Diagnosis not present

## 2014-02-19 DIAGNOSIS — F329 Major depressive disorder, single episode, unspecified: Secondary | ICD-10-CM | POA: Diagnosis not present

## 2014-02-19 DIAGNOSIS — E46 Unspecified protein-calorie malnutrition: Secondary | ICD-10-CM | POA: Diagnosis not present

## 2014-02-19 DIAGNOSIS — I10 Essential (primary) hypertension: Secondary | ICD-10-CM | POA: Diagnosis not present

## 2014-02-19 DIAGNOSIS — I259 Chronic ischemic heart disease, unspecified: Secondary | ICD-10-CM | POA: Diagnosis not present

## 2014-02-19 DIAGNOSIS — M069 Rheumatoid arthritis, unspecified: Secondary | ICD-10-CM | POA: Diagnosis not present

## 2014-02-19 DIAGNOSIS — J189 Pneumonia, unspecified organism: Secondary | ICD-10-CM | POA: Diagnosis not present

## 2014-02-20 DIAGNOSIS — I259 Chronic ischemic heart disease, unspecified: Secondary | ICD-10-CM | POA: Diagnosis not present

## 2014-02-20 DIAGNOSIS — M6250 Muscle wasting and atrophy, not elsewhere classified, unspecified site: Secondary | ICD-10-CM | POA: Diagnosis not present

## 2014-02-20 DIAGNOSIS — I10 Essential (primary) hypertension: Secondary | ICD-10-CM | POA: Diagnosis not present

## 2014-02-20 DIAGNOSIS — J189 Pneumonia, unspecified organism: Secondary | ICD-10-CM | POA: Diagnosis not present

## 2014-02-20 DIAGNOSIS — E46 Unspecified protein-calorie malnutrition: Secondary | ICD-10-CM | POA: Diagnosis not present

## 2014-02-20 DIAGNOSIS — M069 Rheumatoid arthritis, unspecified: Secondary | ICD-10-CM | POA: Diagnosis not present

## 2014-02-21 DIAGNOSIS — J189 Pneumonia, unspecified organism: Secondary | ICD-10-CM | POA: Diagnosis not present

## 2014-02-21 DIAGNOSIS — M6250 Muscle wasting and atrophy, not elsewhere classified, unspecified site: Secondary | ICD-10-CM | POA: Diagnosis not present

## 2014-02-21 DIAGNOSIS — M069 Rheumatoid arthritis, unspecified: Secondary | ICD-10-CM | POA: Diagnosis not present

## 2014-02-21 DIAGNOSIS — I10 Essential (primary) hypertension: Secondary | ICD-10-CM | POA: Diagnosis not present

## 2014-02-21 DIAGNOSIS — I259 Chronic ischemic heart disease, unspecified: Secondary | ICD-10-CM | POA: Diagnosis not present

## 2014-02-21 DIAGNOSIS — E46 Unspecified protein-calorie malnutrition: Secondary | ICD-10-CM | POA: Diagnosis not present

## 2014-02-23 DIAGNOSIS — G250 Essential tremor: Secondary | ICD-10-CM | POA: Diagnosis not present

## 2014-02-24 DIAGNOSIS — E46 Unspecified protein-calorie malnutrition: Secondary | ICD-10-CM | POA: Diagnosis not present

## 2014-02-24 DIAGNOSIS — M6250 Muscle wasting and atrophy, not elsewhere classified, unspecified site: Secondary | ICD-10-CM | POA: Diagnosis not present

## 2014-02-24 DIAGNOSIS — M069 Rheumatoid arthritis, unspecified: Secondary | ICD-10-CM | POA: Diagnosis not present

## 2014-02-24 DIAGNOSIS — J189 Pneumonia, unspecified organism: Secondary | ICD-10-CM | POA: Diagnosis not present

## 2014-02-24 DIAGNOSIS — I259 Chronic ischemic heart disease, unspecified: Secondary | ICD-10-CM | POA: Diagnosis not present

## 2014-02-24 DIAGNOSIS — I10 Essential (primary) hypertension: Secondary | ICD-10-CM | POA: Diagnosis not present

## 2014-02-25 DIAGNOSIS — J189 Pneumonia, unspecified organism: Secondary | ICD-10-CM | POA: Diagnosis not present

## 2014-02-25 DIAGNOSIS — M069 Rheumatoid arthritis, unspecified: Secondary | ICD-10-CM | POA: Diagnosis not present

## 2014-02-25 DIAGNOSIS — E46 Unspecified protein-calorie malnutrition: Secondary | ICD-10-CM | POA: Diagnosis not present

## 2014-02-25 DIAGNOSIS — M6250 Muscle wasting and atrophy, not elsewhere classified, unspecified site: Secondary | ICD-10-CM | POA: Diagnosis not present

## 2014-02-25 DIAGNOSIS — I259 Chronic ischemic heart disease, unspecified: Secondary | ICD-10-CM | POA: Diagnosis not present

## 2014-02-25 DIAGNOSIS — I10 Essential (primary) hypertension: Secondary | ICD-10-CM | POA: Diagnosis not present

## 2014-02-26 DIAGNOSIS — I10 Essential (primary) hypertension: Secondary | ICD-10-CM | POA: Diagnosis not present

## 2014-02-26 DIAGNOSIS — M6250 Muscle wasting and atrophy, not elsewhere classified, unspecified site: Secondary | ICD-10-CM | POA: Diagnosis not present

## 2014-02-26 DIAGNOSIS — I259 Chronic ischemic heart disease, unspecified: Secondary | ICD-10-CM | POA: Diagnosis not present

## 2014-02-26 DIAGNOSIS — E46 Unspecified protein-calorie malnutrition: Secondary | ICD-10-CM | POA: Diagnosis not present

## 2014-02-26 DIAGNOSIS — J189 Pneumonia, unspecified organism: Secondary | ICD-10-CM | POA: Diagnosis not present

## 2014-02-26 DIAGNOSIS — M069 Rheumatoid arthritis, unspecified: Secondary | ICD-10-CM | POA: Diagnosis not present

## 2014-02-27 DIAGNOSIS — M069 Rheumatoid arthritis, unspecified: Secondary | ICD-10-CM | POA: Diagnosis not present

## 2014-02-27 DIAGNOSIS — M6250 Muscle wasting and atrophy, not elsewhere classified, unspecified site: Secondary | ICD-10-CM | POA: Diagnosis not present

## 2014-02-27 DIAGNOSIS — E46 Unspecified protein-calorie malnutrition: Secondary | ICD-10-CM | POA: Diagnosis not present

## 2014-02-27 DIAGNOSIS — I10 Essential (primary) hypertension: Secondary | ICD-10-CM | POA: Diagnosis not present

## 2014-02-27 DIAGNOSIS — J189 Pneumonia, unspecified organism: Secondary | ICD-10-CM | POA: Diagnosis not present

## 2014-02-27 DIAGNOSIS — I259 Chronic ischemic heart disease, unspecified: Secondary | ICD-10-CM | POA: Diagnosis not present

## 2014-02-28 DIAGNOSIS — I10 Essential (primary) hypertension: Secondary | ICD-10-CM | POA: Diagnosis not present

## 2014-02-28 DIAGNOSIS — E46 Unspecified protein-calorie malnutrition: Secondary | ICD-10-CM | POA: Diagnosis not present

## 2014-02-28 DIAGNOSIS — M069 Rheumatoid arthritis, unspecified: Secondary | ICD-10-CM | POA: Diagnosis not present

## 2014-02-28 DIAGNOSIS — J189 Pneumonia, unspecified organism: Secondary | ICD-10-CM | POA: Diagnosis not present

## 2014-02-28 DIAGNOSIS — I259 Chronic ischemic heart disease, unspecified: Secondary | ICD-10-CM | POA: Diagnosis not present

## 2014-02-28 DIAGNOSIS — M6250 Muscle wasting and atrophy, not elsewhere classified, unspecified site: Secondary | ICD-10-CM | POA: Diagnosis not present

## 2014-03-02 DIAGNOSIS — I10 Essential (primary) hypertension: Secondary | ICD-10-CM | POA: Diagnosis not present

## 2014-03-02 DIAGNOSIS — E46 Unspecified protein-calorie malnutrition: Secondary | ICD-10-CM | POA: Diagnosis not present

## 2014-03-02 DIAGNOSIS — I259 Chronic ischemic heart disease, unspecified: Secondary | ICD-10-CM | POA: Diagnosis not present

## 2014-03-02 DIAGNOSIS — M6250 Muscle wasting and atrophy, not elsewhere classified, unspecified site: Secondary | ICD-10-CM | POA: Diagnosis not present

## 2014-03-02 DIAGNOSIS — M069 Rheumatoid arthritis, unspecified: Secondary | ICD-10-CM | POA: Diagnosis not present

## 2014-03-02 DIAGNOSIS — J189 Pneumonia, unspecified organism: Secondary | ICD-10-CM | POA: Diagnosis not present

## 2014-03-03 DIAGNOSIS — J189 Pneumonia, unspecified organism: Secondary | ICD-10-CM | POA: Diagnosis not present

## 2014-03-03 DIAGNOSIS — I259 Chronic ischemic heart disease, unspecified: Secondary | ICD-10-CM | POA: Diagnosis not present

## 2014-03-03 DIAGNOSIS — M069 Rheumatoid arthritis, unspecified: Secondary | ICD-10-CM | POA: Diagnosis not present

## 2014-03-03 DIAGNOSIS — M6250 Muscle wasting and atrophy, not elsewhere classified, unspecified site: Secondary | ICD-10-CM | POA: Diagnosis not present

## 2014-03-03 DIAGNOSIS — E46 Unspecified protein-calorie malnutrition: Secondary | ICD-10-CM | POA: Diagnosis not present

## 2014-03-03 DIAGNOSIS — I10 Essential (primary) hypertension: Secondary | ICD-10-CM | POA: Diagnosis not present

## 2014-03-04 DIAGNOSIS — J189 Pneumonia, unspecified organism: Secondary | ICD-10-CM | POA: Diagnosis not present

## 2014-03-04 DIAGNOSIS — I10 Essential (primary) hypertension: Secondary | ICD-10-CM | POA: Diagnosis not present

## 2014-03-04 DIAGNOSIS — K225 Diverticulum of esophagus, acquired: Secondary | ICD-10-CM | POA: Diagnosis not present

## 2014-03-04 DIAGNOSIS — M069 Rheumatoid arthritis, unspecified: Secondary | ICD-10-CM | POA: Diagnosis not present

## 2014-03-04 DIAGNOSIS — I259 Chronic ischemic heart disease, unspecified: Secondary | ICD-10-CM | POA: Diagnosis not present

## 2014-03-04 DIAGNOSIS — M6250 Muscle wasting and atrophy, not elsewhere classified, unspecified site: Secondary | ICD-10-CM | POA: Diagnosis not present

## 2014-03-04 DIAGNOSIS — R279 Unspecified lack of coordination: Secondary | ICD-10-CM | POA: Diagnosis not present

## 2014-03-04 DIAGNOSIS — M25569 Pain in unspecified knee: Secondary | ICD-10-CM | POA: Diagnosis not present

## 2014-03-04 DIAGNOSIS — E46 Unspecified protein-calorie malnutrition: Secondary | ICD-10-CM | POA: Diagnosis not present

## 2014-03-05 DIAGNOSIS — I10 Essential (primary) hypertension: Secondary | ICD-10-CM | POA: Diagnosis not present

## 2014-03-05 DIAGNOSIS — I259 Chronic ischemic heart disease, unspecified: Secondary | ICD-10-CM | POA: Diagnosis not present

## 2014-03-05 DIAGNOSIS — E46 Unspecified protein-calorie malnutrition: Secondary | ICD-10-CM | POA: Diagnosis not present

## 2014-03-05 DIAGNOSIS — M6250 Muscle wasting and atrophy, not elsewhere classified, unspecified site: Secondary | ICD-10-CM | POA: Diagnosis not present

## 2014-03-05 DIAGNOSIS — M069 Rheumatoid arthritis, unspecified: Secondary | ICD-10-CM | POA: Diagnosis not present

## 2014-03-05 DIAGNOSIS — J189 Pneumonia, unspecified organism: Secondary | ICD-10-CM | POA: Diagnosis not present

## 2014-03-07 DIAGNOSIS — E785 Hyperlipidemia, unspecified: Secondary | ICD-10-CM | POA: Diagnosis not present

## 2014-03-07 DIAGNOSIS — I10 Essential (primary) hypertension: Secondary | ICD-10-CM | POA: Diagnosis not present

## 2014-03-07 DIAGNOSIS — K7689 Other specified diseases of liver: Secondary | ICD-10-CM | POA: Diagnosis not present

## 2014-03-07 DIAGNOSIS — R7402 Elevation of levels of lactic acid dehydrogenase (LDH): Secondary | ICD-10-CM | POA: Diagnosis not present

## 2014-03-08 DIAGNOSIS — I259 Chronic ischemic heart disease, unspecified: Secondary | ICD-10-CM | POA: Diagnosis not present

## 2014-03-08 DIAGNOSIS — M069 Rheumatoid arthritis, unspecified: Secondary | ICD-10-CM | POA: Diagnosis not present

## 2014-03-08 DIAGNOSIS — E46 Unspecified protein-calorie malnutrition: Secondary | ICD-10-CM | POA: Diagnosis not present

## 2014-03-08 DIAGNOSIS — J189 Pneumonia, unspecified organism: Secondary | ICD-10-CM | POA: Diagnosis not present

## 2014-03-08 DIAGNOSIS — I10 Essential (primary) hypertension: Secondary | ICD-10-CM | POA: Diagnosis not present

## 2014-03-08 DIAGNOSIS — M6250 Muscle wasting and atrophy, not elsewhere classified, unspecified site: Secondary | ICD-10-CM | POA: Diagnosis not present

## 2014-03-10 DIAGNOSIS — E46 Unspecified protein-calorie malnutrition: Secondary | ICD-10-CM | POA: Diagnosis not present

## 2014-03-10 DIAGNOSIS — I259 Chronic ischemic heart disease, unspecified: Secondary | ICD-10-CM | POA: Diagnosis not present

## 2014-03-10 DIAGNOSIS — I10 Essential (primary) hypertension: Secondary | ICD-10-CM | POA: Diagnosis not present

## 2014-03-10 DIAGNOSIS — J189 Pneumonia, unspecified organism: Secondary | ICD-10-CM | POA: Diagnosis not present

## 2014-03-10 DIAGNOSIS — M069 Rheumatoid arthritis, unspecified: Secondary | ICD-10-CM | POA: Diagnosis not present

## 2014-03-10 DIAGNOSIS — M6250 Muscle wasting and atrophy, not elsewhere classified, unspecified site: Secondary | ICD-10-CM | POA: Diagnosis not present

## 2014-03-11 DIAGNOSIS — I10 Essential (primary) hypertension: Secondary | ICD-10-CM | POA: Diagnosis not present

## 2014-03-11 DIAGNOSIS — M6250 Muscle wasting and atrophy, not elsewhere classified, unspecified site: Secondary | ICD-10-CM | POA: Diagnosis not present

## 2014-03-11 DIAGNOSIS — M069 Rheumatoid arthritis, unspecified: Secondary | ICD-10-CM | POA: Diagnosis not present

## 2014-03-11 DIAGNOSIS — J189 Pneumonia, unspecified organism: Secondary | ICD-10-CM | POA: Diagnosis not present

## 2014-03-11 DIAGNOSIS — I259 Chronic ischemic heart disease, unspecified: Secondary | ICD-10-CM | POA: Diagnosis not present

## 2014-03-11 DIAGNOSIS — E46 Unspecified protein-calorie malnutrition: Secondary | ICD-10-CM | POA: Diagnosis not present

## 2014-03-12 DIAGNOSIS — E46 Unspecified protein-calorie malnutrition: Secondary | ICD-10-CM | POA: Diagnosis not present

## 2014-03-12 DIAGNOSIS — M069 Rheumatoid arthritis, unspecified: Secondary | ICD-10-CM | POA: Diagnosis not present

## 2014-03-12 DIAGNOSIS — I259 Chronic ischemic heart disease, unspecified: Secondary | ICD-10-CM | POA: Diagnosis not present

## 2014-03-12 DIAGNOSIS — I10 Essential (primary) hypertension: Secondary | ICD-10-CM | POA: Diagnosis not present

## 2014-03-12 DIAGNOSIS — J189 Pneumonia, unspecified organism: Secondary | ICD-10-CM | POA: Diagnosis not present

## 2014-03-12 DIAGNOSIS — M6250 Muscle wasting and atrophy, not elsewhere classified, unspecified site: Secondary | ICD-10-CM | POA: Diagnosis not present

## 2014-03-13 DIAGNOSIS — E46 Unspecified protein-calorie malnutrition: Secondary | ICD-10-CM | POA: Diagnosis not present

## 2014-03-13 DIAGNOSIS — I259 Chronic ischemic heart disease, unspecified: Secondary | ICD-10-CM | POA: Diagnosis not present

## 2014-03-13 DIAGNOSIS — J189 Pneumonia, unspecified organism: Secondary | ICD-10-CM | POA: Diagnosis not present

## 2014-03-13 DIAGNOSIS — M6250 Muscle wasting and atrophy, not elsewhere classified, unspecified site: Secondary | ICD-10-CM | POA: Diagnosis not present

## 2014-03-13 DIAGNOSIS — I10 Essential (primary) hypertension: Secondary | ICD-10-CM | POA: Diagnosis not present

## 2014-03-13 DIAGNOSIS — M069 Rheumatoid arthritis, unspecified: Secondary | ICD-10-CM | POA: Diagnosis not present

## 2014-03-14 DIAGNOSIS — J189 Pneumonia, unspecified organism: Secondary | ICD-10-CM | POA: Diagnosis not present

## 2014-03-14 DIAGNOSIS — M6250 Muscle wasting and atrophy, not elsewhere classified, unspecified site: Secondary | ICD-10-CM | POA: Diagnosis not present

## 2014-03-14 DIAGNOSIS — M069 Rheumatoid arthritis, unspecified: Secondary | ICD-10-CM | POA: Diagnosis not present

## 2014-03-14 DIAGNOSIS — I10 Essential (primary) hypertension: Secondary | ICD-10-CM | POA: Diagnosis not present

## 2014-03-14 DIAGNOSIS — E46 Unspecified protein-calorie malnutrition: Secondary | ICD-10-CM | POA: Diagnosis not present

## 2014-03-14 DIAGNOSIS — I259 Chronic ischemic heart disease, unspecified: Secondary | ICD-10-CM | POA: Diagnosis not present

## 2014-03-17 DIAGNOSIS — F329 Major depressive disorder, single episode, unspecified: Secondary | ICD-10-CM | POA: Diagnosis not present

## 2014-03-17 DIAGNOSIS — I259 Chronic ischemic heart disease, unspecified: Secondary | ICD-10-CM | POA: Diagnosis not present

## 2014-03-17 DIAGNOSIS — I10 Essential (primary) hypertension: Secondary | ICD-10-CM | POA: Diagnosis not present

## 2014-03-17 DIAGNOSIS — G4700 Insomnia, unspecified: Secondary | ICD-10-CM | POA: Diagnosis not present

## 2014-03-17 DIAGNOSIS — M6250 Muscle wasting and atrophy, not elsewhere classified, unspecified site: Secondary | ICD-10-CM | POA: Diagnosis not present

## 2014-03-17 DIAGNOSIS — J189 Pneumonia, unspecified organism: Secondary | ICD-10-CM | POA: Diagnosis not present

## 2014-03-17 DIAGNOSIS — F411 Generalized anxiety disorder: Secondary | ICD-10-CM | POA: Diagnosis not present

## 2014-03-17 DIAGNOSIS — M069 Rheumatoid arthritis, unspecified: Secondary | ICD-10-CM | POA: Diagnosis not present

## 2014-03-17 DIAGNOSIS — E46 Unspecified protein-calorie malnutrition: Secondary | ICD-10-CM | POA: Diagnosis not present

## 2014-03-17 DIAGNOSIS — F3289 Other specified depressive episodes: Secondary | ICD-10-CM | POA: Diagnosis not present

## 2014-03-18 DIAGNOSIS — F3289 Other specified depressive episodes: Secondary | ICD-10-CM | POA: Diagnosis not present

## 2014-03-18 DIAGNOSIS — F329 Major depressive disorder, single episode, unspecified: Secondary | ICD-10-CM | POA: Diagnosis not present

## 2014-03-20 DIAGNOSIS — F3289 Other specified depressive episodes: Secondary | ICD-10-CM | POA: Diagnosis not present

## 2014-03-20 DIAGNOSIS — F329 Major depressive disorder, single episode, unspecified: Secondary | ICD-10-CM | POA: Diagnosis not present

## 2014-03-24 DIAGNOSIS — F329 Major depressive disorder, single episode, unspecified: Secondary | ICD-10-CM | POA: Diagnosis not present

## 2014-03-24 DIAGNOSIS — F3289 Other specified depressive episodes: Secondary | ICD-10-CM | POA: Diagnosis not present

## 2014-03-26 DIAGNOSIS — F329 Major depressive disorder, single episode, unspecified: Secondary | ICD-10-CM | POA: Diagnosis not present

## 2014-03-26 DIAGNOSIS — F3289 Other specified depressive episodes: Secondary | ICD-10-CM | POA: Diagnosis not present

## 2014-03-31 DIAGNOSIS — F329 Major depressive disorder, single episode, unspecified: Secondary | ICD-10-CM | POA: Diagnosis not present

## 2014-03-31 DIAGNOSIS — F3289 Other specified depressive episodes: Secondary | ICD-10-CM | POA: Diagnosis not present

## 2014-04-02 DIAGNOSIS — F329 Major depressive disorder, single episode, unspecified: Secondary | ICD-10-CM | POA: Diagnosis not present

## 2014-04-02 DIAGNOSIS — F3289 Other specified depressive episodes: Secondary | ICD-10-CM | POA: Diagnosis not present

## 2014-04-07 DIAGNOSIS — F411 Generalized anxiety disorder: Secondary | ICD-10-CM | POA: Diagnosis not present

## 2014-04-07 DIAGNOSIS — G4700 Insomnia, unspecified: Secondary | ICD-10-CM | POA: Diagnosis not present

## 2014-04-07 DIAGNOSIS — F329 Major depressive disorder, single episode, unspecified: Secondary | ICD-10-CM | POA: Diagnosis not present

## 2014-04-09 DIAGNOSIS — F329 Major depressive disorder, single episode, unspecified: Secondary | ICD-10-CM | POA: Diagnosis not present

## 2014-04-13 DIAGNOSIS — R42 Dizziness and giddiness: Secondary | ICD-10-CM | POA: Diagnosis not present

## 2014-04-15 DIAGNOSIS — F329 Major depressive disorder, single episode, unspecified: Secondary | ICD-10-CM | POA: Diagnosis not present

## 2014-04-17 DIAGNOSIS — F329 Major depressive disorder, single episode, unspecified: Secondary | ICD-10-CM | POA: Diagnosis not present

## 2014-04-19 DIAGNOSIS — Z23 Encounter for immunization: Secondary | ICD-10-CM | POA: Diagnosis not present

## 2014-04-22 DIAGNOSIS — J189 Pneumonia, unspecified organism: Secondary | ICD-10-CM | POA: Diagnosis not present

## 2014-04-22 DIAGNOSIS — F329 Major depressive disorder, single episode, unspecified: Secondary | ICD-10-CM | POA: Diagnosis not present

## 2014-04-24 DIAGNOSIS — M79675 Pain in left toe(s): Secondary | ICD-10-CM | POA: Diagnosis not present

## 2014-04-24 DIAGNOSIS — B351 Tinea unguium: Secondary | ICD-10-CM | POA: Diagnosis not present

## 2014-04-24 DIAGNOSIS — F329 Major depressive disorder, single episode, unspecified: Secondary | ICD-10-CM | POA: Diagnosis not present

## 2014-04-30 DIAGNOSIS — F329 Major depressive disorder, single episode, unspecified: Secondary | ICD-10-CM | POA: Diagnosis not present

## 2014-05-02 DIAGNOSIS — F329 Major depressive disorder, single episode, unspecified: Secondary | ICD-10-CM | POA: Diagnosis not present

## 2014-05-06 DIAGNOSIS — F329 Major depressive disorder, single episode, unspecified: Secondary | ICD-10-CM | POA: Diagnosis not present

## 2014-05-08 DIAGNOSIS — F329 Major depressive disorder, single episode, unspecified: Secondary | ICD-10-CM | POA: Diagnosis not present

## 2014-05-14 DIAGNOSIS — F329 Major depressive disorder, single episode, unspecified: Secondary | ICD-10-CM | POA: Diagnosis not present

## 2014-05-16 DIAGNOSIS — F329 Major depressive disorder, single episode, unspecified: Secondary | ICD-10-CM | POA: Diagnosis not present

## 2014-05-19 DIAGNOSIS — F329 Major depressive disorder, single episode, unspecified: Secondary | ICD-10-CM | POA: Diagnosis not present

## 2014-05-21 DIAGNOSIS — F329 Major depressive disorder, single episode, unspecified: Secondary | ICD-10-CM | POA: Diagnosis not present

## 2014-05-25 DIAGNOSIS — F329 Major depressive disorder, single episode, unspecified: Secondary | ICD-10-CM | POA: Diagnosis not present

## 2014-05-27 DIAGNOSIS — F329 Major depressive disorder, single episode, unspecified: Secondary | ICD-10-CM | POA: Diagnosis not present

## 2014-06-02 DIAGNOSIS — I4891 Unspecified atrial fibrillation: Secondary | ICD-10-CM | POA: Diagnosis not present

## 2014-06-02 DIAGNOSIS — F329 Major depressive disorder, single episode, unspecified: Secondary | ICD-10-CM | POA: Diagnosis not present

## 2014-06-02 DIAGNOSIS — K839 Disease of biliary tract, unspecified: Secondary | ICD-10-CM | POA: Diagnosis not present

## 2014-06-02 DIAGNOSIS — M069 Rheumatoid arthritis, unspecified: Secondary | ICD-10-CM | POA: Diagnosis not present

## 2014-06-02 DIAGNOSIS — I259 Chronic ischemic heart disease, unspecified: Secondary | ICD-10-CM | POA: Diagnosis not present

## 2014-06-02 DIAGNOSIS — I214 Non-ST elevation (NSTEMI) myocardial infarction: Secondary | ICD-10-CM | POA: Diagnosis not present

## 2014-06-02 DIAGNOSIS — I10 Essential (primary) hypertension: Secondary | ICD-10-CM | POA: Diagnosis not present

## 2014-06-02 DIAGNOSIS — K210 Gastro-esophageal reflux disease with esophagitis: Secondary | ICD-10-CM | POA: Diagnosis not present

## 2014-06-02 DIAGNOSIS — M159 Polyosteoarthritis, unspecified: Secondary | ICD-10-CM | POA: Diagnosis not present

## 2014-06-03 DIAGNOSIS — F329 Major depressive disorder, single episode, unspecified: Secondary | ICD-10-CM | POA: Diagnosis not present

## 2014-06-03 DIAGNOSIS — R1314 Dysphagia, pharyngoesophageal phase: Secondary | ICD-10-CM | POA: Diagnosis not present

## 2014-06-03 DIAGNOSIS — F411 Generalized anxiety disorder: Secondary | ICD-10-CM | POA: Diagnosis not present

## 2014-06-03 DIAGNOSIS — G4700 Insomnia, unspecified: Secondary | ICD-10-CM | POA: Diagnosis not present

## 2014-06-04 DIAGNOSIS — R1314 Dysphagia, pharyngoesophageal phase: Secondary | ICD-10-CM | POA: Diagnosis not present

## 2014-06-05 DIAGNOSIS — F329 Major depressive disorder, single episode, unspecified: Secondary | ICD-10-CM | POA: Diagnosis not present

## 2014-06-05 DIAGNOSIS — R1314 Dysphagia, pharyngoesophageal phase: Secondary | ICD-10-CM | POA: Diagnosis not present

## 2014-06-06 DIAGNOSIS — R1314 Dysphagia, pharyngoesophageal phase: Secondary | ICD-10-CM | POA: Diagnosis not present

## 2014-06-08 DIAGNOSIS — F329 Major depressive disorder, single episode, unspecified: Secondary | ICD-10-CM | POA: Diagnosis not present

## 2014-06-09 DIAGNOSIS — R1314 Dysphagia, pharyngoesophageal phase: Secondary | ICD-10-CM | POA: Diagnosis not present

## 2014-06-10 DIAGNOSIS — F329 Major depressive disorder, single episode, unspecified: Secondary | ICD-10-CM | POA: Diagnosis not present

## 2014-06-10 DIAGNOSIS — R1314 Dysphagia, pharyngoesophageal phase: Secondary | ICD-10-CM | POA: Diagnosis not present

## 2014-06-11 DIAGNOSIS — R1314 Dysphagia, pharyngoesophageal phase: Secondary | ICD-10-CM | POA: Diagnosis not present

## 2014-06-13 DIAGNOSIS — R1314 Dysphagia, pharyngoesophageal phase: Secondary | ICD-10-CM | POA: Diagnosis not present

## 2014-06-13 DIAGNOSIS — J189 Pneumonia, unspecified organism: Secondary | ICD-10-CM | POA: Diagnosis not present

## 2014-06-14 DIAGNOSIS — R1314 Dysphagia, pharyngoesophageal phase: Secondary | ICD-10-CM | POA: Diagnosis not present

## 2014-06-16 DIAGNOSIS — R1314 Dysphagia, pharyngoesophageal phase: Secondary | ICD-10-CM | POA: Diagnosis not present

## 2014-06-16 DIAGNOSIS — F329 Major depressive disorder, single episode, unspecified: Secondary | ICD-10-CM | POA: Diagnosis not present

## 2014-06-16 DIAGNOSIS — R05 Cough: Secondary | ICD-10-CM | POA: Diagnosis not present

## 2014-06-16 DIAGNOSIS — R0989 Other specified symptoms and signs involving the circulatory and respiratory systems: Secondary | ICD-10-CM | POA: Diagnosis not present

## 2014-06-16 DIAGNOSIS — Z08 Encounter for follow-up examination after completed treatment for malignant neoplasm: Secondary | ICD-10-CM | POA: Diagnosis not present

## 2014-06-17 DIAGNOSIS — R1314 Dysphagia, pharyngoesophageal phase: Secondary | ICD-10-CM | POA: Diagnosis not present

## 2014-06-18 DIAGNOSIS — F329 Major depressive disorder, single episode, unspecified: Secondary | ICD-10-CM | POA: Diagnosis not present

## 2014-06-18 DIAGNOSIS — R1314 Dysphagia, pharyngoesophageal phase: Secondary | ICD-10-CM | POA: Diagnosis not present

## 2014-06-19 ENCOUNTER — Encounter: Payer: Self-pay | Admitting: Cardiology

## 2014-06-19 ENCOUNTER — Ambulatory Visit (INDEPENDENT_AMBULATORY_CARE_PROVIDER_SITE_OTHER): Payer: Medicare Other | Admitting: Cardiology

## 2014-06-19 VITALS — BP 103/59 | HR 64 | Ht 59.0 in | Wt 118.0 lb

## 2014-06-19 DIAGNOSIS — I2510 Atherosclerotic heart disease of native coronary artery without angina pectoris: Secondary | ICD-10-CM

## 2014-06-19 DIAGNOSIS — R1314 Dysphagia, pharyngoesophageal phase: Secondary | ICD-10-CM | POA: Diagnosis not present

## 2014-06-19 DIAGNOSIS — I10 Essential (primary) hypertension: Secondary | ICD-10-CM | POA: Diagnosis not present

## 2014-06-19 DIAGNOSIS — E785 Hyperlipidemia, unspecified: Secondary | ICD-10-CM

## 2014-06-19 NOTE — Progress Notes (Signed)
Clinical Summary Candice Meza is a 80 y.o.female seen today for follow up of the following medical problems.   1. CAD  - prior MI in 1997, non-obstructive disease by cath at that time.  - denies any chest pain  - DOE walking down hall at nursing home which is stable. Occas LE edema - echo 2008 LVEF 60%, mild AI   2. HTN  - compliant with meds   3. Hyperlipidemia  - compliant statin  - no recent panel in our system, she reports fairly recent labs at her nursing home      Past Medical History  Diagnosis Date  . MI, acute, non ST segment elevation 1997    Questionable lesion in the circumflex at catheterization; normal EF; chronic dyspnea without specific cause identified  . Hyperlipidemia   . Hypertension   . Postmenopausal     Estrogen replacement therapy  . Cough     Prolonged, possible asthmatic bronchitis  . Benign essential tremor   . Osteopenia     Versus osteoporosis  . Macular degeneration   . Degenerative joint disease     versus rheumatoid arthritis  . Repeated falls      Allergies  Allergen Reactions  . Aspirin     GI symptoms  . Bee Venom   . Morphine And Related   . Sulfa Antibiotics     dyspnea  . Tetracyclines & Related   . Codeine Rash  . Pamelor Rash  . Penicillins Rash     Current Outpatient Prescriptions  Medication Sig Dispense Refill  . acetaminophen (TYLENOL) 325 MG tablet Take 650 mg by mouth every 6 (six) hours as needed.    [name] acetaminophen (TYLENOL) 650 MG CR tablet Take 1,300 mg by mouth 2 (two) times daily as needed. For pain    . albuterol (PROVENTIL) (2.5 MG/3ML) 0.083% nebulizer solution Take 2.5 mg by nebulization every 8 (eight) hours.     . ALPRAZolam (XANAX) 0.25 MG tablet Take 0.25 mg by mouth every 8 (eight) hours as needed. For anxiety    . aluminum & magnesium hydroxide-simethicone (MYLANTA) 500-450-40 MG/5ML suspension Take 15 mLs by mouth at bedtime.    [name] aspirin 81 MG tablet Take 81 mg by mouth daily.       . beclomethasone (QVAR) 80 MCG/ACT inhaler Inhale 1 puff into the lungs 2 (two) times daily.      . Calcium Carb-Cholecalciferol (CALCIUM 500 +D) 500-400 MG-UNIT TABS Take 1 tablet by mouth daily.    . cetirizine (ZYRTEC) 10 MG tablet Take 10 mg by mouth daily.      . citalopram (CELEXA) 40 MG tablet Take 40 mg by mouth daily.    [name] dextromethorphan (DELSYM) 30 MG/5ML liquid Take 60 mg by mouth 2 (two) times daily as needed. For  cough    . fluticasone (FLONASE) 50 MCG/ACT nasal spray Place 2 sprays into the nose daily.      . furosemide (LASIX) 40 MG tablet Take 40 mg by mouth as needed.    . furosemide (LASIX) 40 MG tablet Take 1 tablet (40 mg total) by mouth daily. 90 tablet 3  . GUAIFENESIN PO Take 600 mg by mouth 2 (two) times daily.    [name] ipratropium-albuterol (DUONEB) 0.5-2.5 (3) MG/3ML SOLN Take 3 mLs by nebulization every 2 (two) hours as needed.    . Multiple Vitamin (MULTIVITAMIN) tablet Take 1 tablet by mouth daily.      . nabumetone (RELAFEN) 500 MG tablet  Take 500 mg by mouth daily.     . nitroGLYCERIN (NITROSTAT) 0.4 MG SL tablet Place 0.4 mg under the tongue as directed.      . NON FORMULARY Guar Gum Powder - Benefiber Sugar Free (guar gum) Powder 30 Milliters by mouth twice daily for constipation in 8 oz water    . omeprazole (PRILOSEC) 40 MG capsule Take 40 mg by mouth daily.      Bertram Gala Glycol-Propyl Glycol (SYSTANE) 0.4-0.3 % SOLN Apply 1 drop to eye 3 (three) times daily.      . polyethylene glycol powder (GLYCOLAX/MIRALAX) powder Take 17 g by mouth daily.    . potassium chloride (K-DUR) 10 MEQ tablet Take 20 mEq by mouth daily.    . pravastatin (PRAVACHOL) 40 MG tablet Take 40 mg by mouth at bedtime.      . promethazine (PHENERGAN) 25 MG tablet Take 12.5 mg by mouth every 4 (four) hours as needed. For nausea    . traMADol (ULTRAM) 50 MG tablet Take 50 mg by mouth every 6 (six) hours as needed. For pain    . vitamin E 400 UNIT capsule Take 400 Units by mouth daily.        No current facility-administered medications for this visit.     Past Surgical History  Procedure Laterality Date  . Bladder suspension    . Inguinal hernia repair      Left  . Foot surgery      Bilateral  . Ercp  04/18/2012    Procedure: ENDOSCOPIC RETROGRADE CHOLANGIOPANCREATOGRAPHY (ERCP);  Surgeon: Malissa Hippo, MD;  Location: [facility];  Service: Endoscopy;  Laterality: N/A;  Diagnostic     Allergies  Allergen Reactions  . Aspirin     GI symptoms  . Bee Venom   . Morphine And Related   . Sulfa Antibiotics     dyspnea  . Tetracyclines & Related   . Codeine Rash  . Pamelor Rash  . Penicillins Rash      Family History  Problem Relation Age of Onset  . Coronary artery disease    . Stroke       Social History [name] reports that she has never smoked. She has never used smokeless tobacco. [name] reports that she does not drink alcohol.   Review of Systems CONSTITUTIONAL: No weight loss, fever, chills, weakness or fatigue.  HEENT: Eyes: No visual loss, blurred vision, double vision or yellow sclerae.No hearing loss, sneezing, congestion, runny nose or sore throat.  SKIN: No rash or itching.  CARDIOVASCULAR: per HPI RESPIRATORY: No shortness of breath, cough or sputum.  GASTROINTESTINAL: No anorexia, nausea, vomiting or diarrhea. No abdominal pain or blood.  GENITOURINARY: No burning on urination, no polyuria NEUROLOGICAL: No headache, dizziness, syncope, paralysis, ataxia, numbness or tingling in the extremities. No change in bowel or bladder control.  MUSCULOSKELETAL: No muscle, back pain, joint pain or stiffness.  LYMPHATICS: No enlarged nodes. No history of splenectomy.  PSYCHIATRIC: No history of depression or anxiety.  ENDOCRINOLOGIC: No reports of sweating, cold or heat intolerance. No polyuria or polydipsia.  [name]   Physical Examination p 64 bp 103/59 Wt 118 lbs BMI 24 Gen: resting comfortably, no acute distress HEENT: no scleral  icterus, pupils equal round and reactive, no palptable cervical adenopathy,  CV: RRR, 2/6 systolic murmur at apex, no JVD Resp: Clear to auscultation bilaterally GI: abdomen is soft, non-tender, non-distended, normal bowel sounds, no hepatosplenomegaly MSK: extremities are warm, no edema.  Skin: warm,  no rash Neuro:  no focal deficits Psych: appropriate affect   Diagnostic Studies 10/29/13 Echo Study Conclusions  - Left ventricle: The cavity size was normal. Wall thickness was increased in a pattern of mild LVH. Systolic function was normal. The estimated ejection fraction was in the range of 55% to 60%. Wall motion was normal; there were no regional wall motion abnormalities. There was an increased relative contribution of atrial contraction to ventricular filling. Doppler parameters are consistent with abnormal left ventricular relaxation (grade 1 diastolic dysfunction). - Aortic valve: Mildly calcified annulus. Trileaflet; mildly thickened, mildly calcified leaflets. Moderate, centralregurgitation. Minimal aortic stenosis. Peak velocity: 205cm/s (S). Mean gradient: 72mm Hg (S). Valve area: 1.85cm^2(VTI). - Mitral valve: Calcified annulus. Mildly thickened leaflets . Mild regurgitation. - Left atrium: The atrium was mildly dilated. - Tricuspid valve: Mildly thickened leaflets. Mild-moderate regurgitation. - Pulmonary arteries: PA peak pressure: 28mm Hg (S). Mild to moderately elevated pulmonary pressures.    Assessment and Plan  1. CAD  - no current chest pain  - continue secondary prevention and risk factor modification.   2. HTN  - at goal, continue current meds    3. Hyperlipidemia  - continue current statin, request labs from pcp    F/u 1 year   Antoine Poche, M.D.

## 2014-06-19 NOTE — Patient Instructions (Signed)
Continue all current medications. Your physician wants you to follow up in: 6 months.  You will receive a reminder letter in the mail one-two months in advance.  If you don't receive a letter, please call our office to schedule the follow up appointment   

## 2014-06-21 DIAGNOSIS — R1314 Dysphagia, pharyngoesophageal phase: Secondary | ICD-10-CM | POA: Diagnosis not present

## 2014-06-23 DIAGNOSIS — R1314 Dysphagia, pharyngoesophageal phase: Secondary | ICD-10-CM | POA: Diagnosis not present

## 2014-06-23 DIAGNOSIS — F329 Major depressive disorder, single episode, unspecified: Secondary | ICD-10-CM | POA: Diagnosis not present

## 2014-06-24 DIAGNOSIS — R1314 Dysphagia, pharyngoesophageal phase: Secondary | ICD-10-CM | POA: Diagnosis not present

## 2014-06-25 DIAGNOSIS — F329 Major depressive disorder, single episode, unspecified: Secondary | ICD-10-CM | POA: Diagnosis not present

## 2014-06-25 DIAGNOSIS — R1314 Dysphagia, pharyngoesophageal phase: Secondary | ICD-10-CM | POA: Diagnosis not present

## 2014-06-26 DIAGNOSIS — R1314 Dysphagia, pharyngoesophageal phase: Secondary | ICD-10-CM | POA: Diagnosis not present

## 2014-06-28 DIAGNOSIS — R1314 Dysphagia, pharyngoesophageal phase: Secondary | ICD-10-CM | POA: Diagnosis not present

## 2014-06-29 DIAGNOSIS — R1314 Dysphagia, pharyngoesophageal phase: Secondary | ICD-10-CM | POA: Diagnosis not present

## 2014-07-01 DIAGNOSIS — F331 Major depressive disorder, recurrent, moderate: Secondary | ICD-10-CM | POA: Diagnosis not present

## 2014-07-02 DIAGNOSIS — R1314 Dysphagia, pharyngoesophageal phase: Secondary | ICD-10-CM | POA: Diagnosis not present

## 2014-07-03 DIAGNOSIS — R1314 Dysphagia, pharyngoesophageal phase: Secondary | ICD-10-CM | POA: Diagnosis not present

## 2014-07-03 DIAGNOSIS — F331 Major depressive disorder, recurrent, moderate: Secondary | ICD-10-CM | POA: Diagnosis not present

## 2014-07-04 DIAGNOSIS — R1314 Dysphagia, pharyngoesophageal phase: Secondary | ICD-10-CM | POA: Diagnosis not present

## 2014-07-07 DIAGNOSIS — F331 Major depressive disorder, recurrent, moderate: Secondary | ICD-10-CM | POA: Diagnosis not present

## 2014-07-07 DIAGNOSIS — R1314 Dysphagia, pharyngoesophageal phase: Secondary | ICD-10-CM | POA: Diagnosis not present

## 2014-07-08 DIAGNOSIS — R1314 Dysphagia, pharyngoesophageal phase: Secondary | ICD-10-CM | POA: Diagnosis not present

## 2014-07-09 DIAGNOSIS — F331 Major depressive disorder, recurrent, moderate: Secondary | ICD-10-CM | POA: Diagnosis not present

## 2014-07-15 DIAGNOSIS — F331 Major depressive disorder, recurrent, moderate: Secondary | ICD-10-CM | POA: Diagnosis not present

## 2014-07-17 DIAGNOSIS — M79675 Pain in left toe(s): Secondary | ICD-10-CM | POA: Diagnosis not present

## 2014-07-17 DIAGNOSIS — B351 Tinea unguium: Secondary | ICD-10-CM | POA: Diagnosis not present

## 2014-07-17 DIAGNOSIS — F331 Major depressive disorder, recurrent, moderate: Secondary | ICD-10-CM | POA: Diagnosis not present

## 2014-07-22 DIAGNOSIS — F331 Major depressive disorder, recurrent, moderate: Secondary | ICD-10-CM | POA: Diagnosis not present

## 2014-07-24 DIAGNOSIS — F331 Major depressive disorder, recurrent, moderate: Secondary | ICD-10-CM | POA: Diagnosis not present

## 2014-07-28 DIAGNOSIS — F331 Major depressive disorder, recurrent, moderate: Secondary | ICD-10-CM | POA: Diagnosis not present

## 2014-07-30 DIAGNOSIS — F331 Major depressive disorder, recurrent, moderate: Secondary | ICD-10-CM | POA: Diagnosis not present

## 2014-08-04 DIAGNOSIS — F332 Major depressive disorder, recurrent severe without psychotic features: Secondary | ICD-10-CM | POA: Diagnosis not present

## 2014-08-06 DIAGNOSIS — F332 Major depressive disorder, recurrent severe without psychotic features: Secondary | ICD-10-CM | POA: Diagnosis not present

## 2014-08-09 DIAGNOSIS — I2510 Atherosclerotic heart disease of native coronary artery without angina pectoris: Secondary | ICD-10-CM | POA: Diagnosis not present

## 2014-08-12 DIAGNOSIS — F332 Major depressive disorder, recurrent severe without psychotic features: Secondary | ICD-10-CM | POA: Diagnosis not present

## 2014-08-14 DIAGNOSIS — F332 Major depressive disorder, recurrent severe without psychotic features: Secondary | ICD-10-CM | POA: Diagnosis not present

## 2014-08-19 DIAGNOSIS — F332 Major depressive disorder, recurrent severe without psychotic features: Secondary | ICD-10-CM | POA: Diagnosis not present

## 2014-08-20 DIAGNOSIS — R1314 Dysphagia, pharyngoesophageal phase: Secondary | ICD-10-CM | POA: Diagnosis not present

## 2014-08-21 DIAGNOSIS — F332 Major depressive disorder, recurrent severe without psychotic features: Secondary | ICD-10-CM | POA: Diagnosis not present

## 2014-08-21 DIAGNOSIS — R1314 Dysphagia, pharyngoesophageal phase: Secondary | ICD-10-CM | POA: Diagnosis not present

## 2014-08-22 DIAGNOSIS — R1314 Dysphagia, pharyngoesophageal phase: Secondary | ICD-10-CM | POA: Diagnosis not present

## 2014-08-25 DIAGNOSIS — R1314 Dysphagia, pharyngoesophageal phase: Secondary | ICD-10-CM | POA: Diagnosis not present

## 2014-08-26 DIAGNOSIS — F332 Major depressive disorder, recurrent severe without psychotic features: Secondary | ICD-10-CM | POA: Diagnosis not present

## 2014-08-26 DIAGNOSIS — R1314 Dysphagia, pharyngoesophageal phase: Secondary | ICD-10-CM | POA: Diagnosis not present

## 2014-08-27 DIAGNOSIS — R1314 Dysphagia, pharyngoesophageal phase: Secondary | ICD-10-CM | POA: Diagnosis not present

## 2014-08-28 DIAGNOSIS — F332 Major depressive disorder, recurrent severe without psychotic features: Secondary | ICD-10-CM | POA: Diagnosis not present

## 2014-08-28 DIAGNOSIS — R1314 Dysphagia, pharyngoesophageal phase: Secondary | ICD-10-CM | POA: Diagnosis not present

## 2014-08-29 DIAGNOSIS — R1314 Dysphagia, pharyngoesophageal phase: Secondary | ICD-10-CM | POA: Diagnosis not present

## 2014-09-01 DIAGNOSIS — R1314 Dysphagia, pharyngoesophageal phase: Secondary | ICD-10-CM | POA: Diagnosis not present

## 2014-09-02 DIAGNOSIS — F332 Major depressive disorder, recurrent severe without psychotic features: Secondary | ICD-10-CM | POA: Diagnosis not present

## 2014-09-02 DIAGNOSIS — M6281 Muscle weakness (generalized): Secondary | ICD-10-CM | POA: Diagnosis not present

## 2014-09-02 DIAGNOSIS — J189 Pneumonia, unspecified organism: Secondary | ICD-10-CM | POA: Diagnosis not present

## 2014-09-02 DIAGNOSIS — R1314 Dysphagia, pharyngoesophageal phase: Secondary | ICD-10-CM | POA: Diagnosis not present

## 2014-09-03 DIAGNOSIS — R1314 Dysphagia, pharyngoesophageal phase: Secondary | ICD-10-CM | POA: Diagnosis not present

## 2014-09-03 DIAGNOSIS — M6281 Muscle weakness (generalized): Secondary | ICD-10-CM | POA: Diagnosis not present

## 2014-09-03 DIAGNOSIS — J189 Pneumonia, unspecified organism: Secondary | ICD-10-CM | POA: Diagnosis not present

## 2014-09-04 DIAGNOSIS — R1314 Dysphagia, pharyngoesophageal phase: Secondary | ICD-10-CM | POA: Diagnosis not present

## 2014-09-04 DIAGNOSIS — M6281 Muscle weakness (generalized): Secondary | ICD-10-CM | POA: Diagnosis not present

## 2014-09-04 DIAGNOSIS — J189 Pneumonia, unspecified organism: Secondary | ICD-10-CM | POA: Diagnosis not present

## 2014-09-04 DIAGNOSIS — F332 Major depressive disorder, recurrent severe without psychotic features: Secondary | ICD-10-CM | POA: Diagnosis not present

## 2014-09-05 DIAGNOSIS — J189 Pneumonia, unspecified organism: Secondary | ICD-10-CM | POA: Diagnosis not present

## 2014-09-05 DIAGNOSIS — M6281 Muscle weakness (generalized): Secondary | ICD-10-CM | POA: Diagnosis not present

## 2014-09-05 DIAGNOSIS — R1314 Dysphagia, pharyngoesophageal phase: Secondary | ICD-10-CM | POA: Diagnosis not present

## 2014-09-08 DIAGNOSIS — M6281 Muscle weakness (generalized): Secondary | ICD-10-CM | POA: Diagnosis not present

## 2014-09-08 DIAGNOSIS — J189 Pneumonia, unspecified organism: Secondary | ICD-10-CM | POA: Diagnosis not present

## 2014-09-08 DIAGNOSIS — R1314 Dysphagia, pharyngoesophageal phase: Secondary | ICD-10-CM | POA: Diagnosis not present

## 2014-09-09 DIAGNOSIS — R1314 Dysphagia, pharyngoesophageal phase: Secondary | ICD-10-CM | POA: Diagnosis not present

## 2014-09-09 DIAGNOSIS — M6281 Muscle weakness (generalized): Secondary | ICD-10-CM | POA: Diagnosis not present

## 2014-09-09 DIAGNOSIS — F332 Major depressive disorder, recurrent severe without psychotic features: Secondary | ICD-10-CM | POA: Diagnosis not present

## 2014-09-09 DIAGNOSIS — J189 Pneumonia, unspecified organism: Secondary | ICD-10-CM | POA: Diagnosis not present

## 2014-09-10 DIAGNOSIS — R1314 Dysphagia, pharyngoesophageal phase: Secondary | ICD-10-CM | POA: Diagnosis not present

## 2014-09-10 DIAGNOSIS — I10 Essential (primary) hypertension: Secondary | ICD-10-CM | POA: Diagnosis not present

## 2014-09-10 DIAGNOSIS — R740 Nonspecific elevation of levels of transaminase and lactic acid dehydrogenase [LDH]: Secondary | ICD-10-CM | POA: Diagnosis not present

## 2014-09-10 DIAGNOSIS — M6281 Muscle weakness (generalized): Secondary | ICD-10-CM | POA: Diagnosis not present

## 2014-09-10 DIAGNOSIS — J189 Pneumonia, unspecified organism: Secondary | ICD-10-CM | POA: Diagnosis not present

## 2014-09-10 DIAGNOSIS — E785 Hyperlipidemia, unspecified: Secondary | ICD-10-CM | POA: Diagnosis not present

## 2014-09-11 DIAGNOSIS — R1314 Dysphagia, pharyngoesophageal phase: Secondary | ICD-10-CM | POA: Diagnosis not present

## 2014-09-11 DIAGNOSIS — M6281 Muscle weakness (generalized): Secondary | ICD-10-CM | POA: Diagnosis not present

## 2014-09-11 DIAGNOSIS — F332 Major depressive disorder, recurrent severe without psychotic features: Secondary | ICD-10-CM | POA: Diagnosis not present

## 2014-09-11 DIAGNOSIS — J189 Pneumonia, unspecified organism: Secondary | ICD-10-CM | POA: Diagnosis not present

## 2014-09-12 DIAGNOSIS — R1314 Dysphagia, pharyngoesophageal phase: Secondary | ICD-10-CM | POA: Diagnosis not present

## 2014-09-12 DIAGNOSIS — M6281 Muscle weakness (generalized): Secondary | ICD-10-CM | POA: Diagnosis not present

## 2014-09-12 DIAGNOSIS — J189 Pneumonia, unspecified organism: Secondary | ICD-10-CM | POA: Diagnosis not present

## 2014-09-15 DIAGNOSIS — R1314 Dysphagia, pharyngoesophageal phase: Secondary | ICD-10-CM | POA: Diagnosis not present

## 2014-09-15 DIAGNOSIS — J189 Pneumonia, unspecified organism: Secondary | ICD-10-CM | POA: Diagnosis not present

## 2014-09-15 DIAGNOSIS — M6281 Muscle weakness (generalized): Secondary | ICD-10-CM | POA: Diagnosis not present

## 2014-09-16 DIAGNOSIS — J189 Pneumonia, unspecified organism: Secondary | ICD-10-CM | POA: Diagnosis not present

## 2014-09-16 DIAGNOSIS — M6281 Muscle weakness (generalized): Secondary | ICD-10-CM | POA: Diagnosis not present

## 2014-09-16 DIAGNOSIS — R1314 Dysphagia, pharyngoesophageal phase: Secondary | ICD-10-CM | POA: Diagnosis not present

## 2014-09-17 DIAGNOSIS — J189 Pneumonia, unspecified organism: Secondary | ICD-10-CM | POA: Diagnosis not present

## 2014-09-17 DIAGNOSIS — G4700 Insomnia, unspecified: Secondary | ICD-10-CM | POA: Diagnosis not present

## 2014-09-17 DIAGNOSIS — F411 Generalized anxiety disorder: Secondary | ICD-10-CM | POA: Diagnosis not present

## 2014-09-17 DIAGNOSIS — M6281 Muscle weakness (generalized): Secondary | ICD-10-CM | POA: Diagnosis not present

## 2014-09-17 DIAGNOSIS — R1314 Dysphagia, pharyngoesophageal phase: Secondary | ICD-10-CM | POA: Diagnosis not present

## 2014-09-17 DIAGNOSIS — F332 Major depressive disorder, recurrent severe without psychotic features: Secondary | ICD-10-CM | POA: Diagnosis not present

## 2014-09-17 DIAGNOSIS — F329 Major depressive disorder, single episode, unspecified: Secondary | ICD-10-CM | POA: Diagnosis not present

## 2014-09-18 DIAGNOSIS — M6281 Muscle weakness (generalized): Secondary | ICD-10-CM | POA: Diagnosis not present

## 2014-09-18 DIAGNOSIS — R1314 Dysphagia, pharyngoesophageal phase: Secondary | ICD-10-CM | POA: Diagnosis not present

## 2014-09-18 DIAGNOSIS — J189 Pneumonia, unspecified organism: Secondary | ICD-10-CM | POA: Diagnosis not present

## 2014-09-19 DIAGNOSIS — M6281 Muscle weakness (generalized): Secondary | ICD-10-CM | POA: Diagnosis not present

## 2014-09-19 DIAGNOSIS — R1314 Dysphagia, pharyngoesophageal phase: Secondary | ICD-10-CM | POA: Diagnosis not present

## 2014-09-19 DIAGNOSIS — J189 Pneumonia, unspecified organism: Secondary | ICD-10-CM | POA: Diagnosis not present

## 2014-09-19 DIAGNOSIS — F332 Major depressive disorder, recurrent severe without psychotic features: Secondary | ICD-10-CM | POA: Diagnosis not present

## 2014-09-22 DIAGNOSIS — J189 Pneumonia, unspecified organism: Secondary | ICD-10-CM | POA: Diagnosis not present

## 2014-09-22 DIAGNOSIS — M6281 Muscle weakness (generalized): Secondary | ICD-10-CM | POA: Diagnosis not present

## 2014-09-22 DIAGNOSIS — R1314 Dysphagia, pharyngoesophageal phase: Secondary | ICD-10-CM | POA: Diagnosis not present

## 2014-09-23 DIAGNOSIS — F332 Major depressive disorder, recurrent severe without psychotic features: Secondary | ICD-10-CM | POA: Diagnosis not present

## 2014-09-23 DIAGNOSIS — J189 Pneumonia, unspecified organism: Secondary | ICD-10-CM | POA: Diagnosis not present

## 2014-09-23 DIAGNOSIS — R1314 Dysphagia, pharyngoesophageal phase: Secondary | ICD-10-CM | POA: Diagnosis not present

## 2014-09-23 DIAGNOSIS — M6281 Muscle weakness (generalized): Secondary | ICD-10-CM | POA: Diagnosis not present

## 2014-09-24 DIAGNOSIS — M6281 Muscle weakness (generalized): Secondary | ICD-10-CM | POA: Diagnosis not present

## 2014-09-24 DIAGNOSIS — J189 Pneumonia, unspecified organism: Secondary | ICD-10-CM | POA: Diagnosis not present

## 2014-09-24 DIAGNOSIS — R1314 Dysphagia, pharyngoesophageal phase: Secondary | ICD-10-CM | POA: Diagnosis not present

## 2014-09-25 DIAGNOSIS — R1314 Dysphagia, pharyngoesophageal phase: Secondary | ICD-10-CM | POA: Diagnosis not present

## 2014-09-25 DIAGNOSIS — F332 Major depressive disorder, recurrent severe without psychotic features: Secondary | ICD-10-CM | POA: Diagnosis not present

## 2014-09-25 DIAGNOSIS — J189 Pneumonia, unspecified organism: Secondary | ICD-10-CM | POA: Diagnosis not present

## 2014-09-25 DIAGNOSIS — M6281 Muscle weakness (generalized): Secondary | ICD-10-CM | POA: Diagnosis not present

## 2014-09-26 DIAGNOSIS — M6281 Muscle weakness (generalized): Secondary | ICD-10-CM | POA: Diagnosis not present

## 2014-09-26 DIAGNOSIS — R1314 Dysphagia, pharyngoesophageal phase: Secondary | ICD-10-CM | POA: Diagnosis not present

## 2014-09-26 DIAGNOSIS — J189 Pneumonia, unspecified organism: Secondary | ICD-10-CM | POA: Diagnosis not present

## 2014-09-29 DIAGNOSIS — M6281 Muscle weakness (generalized): Secondary | ICD-10-CM | POA: Diagnosis not present

## 2014-09-29 DIAGNOSIS — R1314 Dysphagia, pharyngoesophageal phase: Secondary | ICD-10-CM | POA: Diagnosis not present

## 2014-09-29 DIAGNOSIS — J189 Pneumonia, unspecified organism: Secondary | ICD-10-CM | POA: Diagnosis not present

## 2014-09-30 DIAGNOSIS — M6281 Muscle weakness (generalized): Secondary | ICD-10-CM | POA: Diagnosis not present

## 2014-09-30 DIAGNOSIS — R1314 Dysphagia, pharyngoesophageal phase: Secondary | ICD-10-CM | POA: Diagnosis not present

## 2014-09-30 DIAGNOSIS — J189 Pneumonia, unspecified organism: Secondary | ICD-10-CM | POA: Diagnosis not present

## 2014-09-30 DIAGNOSIS — F332 Major depressive disorder, recurrent severe without psychotic features: Secondary | ICD-10-CM | POA: Diagnosis not present

## 2014-10-01 DIAGNOSIS — R1314 Dysphagia, pharyngoesophageal phase: Secondary | ICD-10-CM | POA: Diagnosis not present

## 2014-10-01 DIAGNOSIS — J189 Pneumonia, unspecified organism: Secondary | ICD-10-CM | POA: Diagnosis not present

## 2014-10-01 DIAGNOSIS — M6281 Muscle weakness (generalized): Secondary | ICD-10-CM | POA: Diagnosis not present

## 2014-10-02 DIAGNOSIS — G4700 Insomnia, unspecified: Secondary | ICD-10-CM | POA: Diagnosis not present

## 2014-10-02 DIAGNOSIS — F332 Major depressive disorder, recurrent severe without psychotic features: Secondary | ICD-10-CM | POA: Diagnosis not present

## 2014-10-02 DIAGNOSIS — M6281 Muscle weakness (generalized): Secondary | ICD-10-CM | POA: Diagnosis not present

## 2014-10-02 DIAGNOSIS — F411 Generalized anxiety disorder: Secondary | ICD-10-CM | POA: Diagnosis not present

## 2014-10-02 DIAGNOSIS — F329 Major depressive disorder, single episode, unspecified: Secondary | ICD-10-CM | POA: Diagnosis not present

## 2014-10-02 DIAGNOSIS — J189 Pneumonia, unspecified organism: Secondary | ICD-10-CM | POA: Diagnosis not present

## 2014-10-02 DIAGNOSIS — R1314 Dysphagia, pharyngoesophageal phase: Secondary | ICD-10-CM | POA: Diagnosis not present

## 2014-10-03 DIAGNOSIS — R1314 Dysphagia, pharyngoesophageal phase: Secondary | ICD-10-CM | POA: Diagnosis not present

## 2014-10-03 DIAGNOSIS — M6281 Muscle weakness (generalized): Secondary | ICD-10-CM | POA: Diagnosis not present

## 2014-10-03 DIAGNOSIS — J189 Pneumonia, unspecified organism: Secondary | ICD-10-CM | POA: Diagnosis not present

## 2014-10-05 DIAGNOSIS — R05 Cough: Secondary | ICD-10-CM | POA: Diagnosis not present

## 2014-10-06 DIAGNOSIS — M6281 Muscle weakness (generalized): Secondary | ICD-10-CM | POA: Diagnosis not present

## 2014-10-06 DIAGNOSIS — J189 Pneumonia, unspecified organism: Secondary | ICD-10-CM | POA: Diagnosis not present

## 2014-10-06 DIAGNOSIS — R1314 Dysphagia, pharyngoesophageal phase: Secondary | ICD-10-CM | POA: Diagnosis not present

## 2014-10-07 DIAGNOSIS — B351 Tinea unguium: Secondary | ICD-10-CM | POA: Diagnosis not present

## 2014-10-07 DIAGNOSIS — M79675 Pain in left toe(s): Secondary | ICD-10-CM | POA: Diagnosis not present

## 2014-10-07 DIAGNOSIS — F332 Major depressive disorder, recurrent severe without psychotic features: Secondary | ICD-10-CM | POA: Diagnosis not present

## 2014-10-09 DIAGNOSIS — F332 Major depressive disorder, recurrent severe without psychotic features: Secondary | ICD-10-CM | POA: Diagnosis not present

## 2014-10-15 DIAGNOSIS — G4700 Insomnia, unspecified: Secondary | ICD-10-CM | POA: Diagnosis not present

## 2014-10-15 DIAGNOSIS — F411 Generalized anxiety disorder: Secondary | ICD-10-CM | POA: Diagnosis not present

## 2014-10-15 DIAGNOSIS — F329 Major depressive disorder, single episode, unspecified: Secondary | ICD-10-CM | POA: Diagnosis not present

## 2014-10-16 DIAGNOSIS — F332 Major depressive disorder, recurrent severe without psychotic features: Secondary | ICD-10-CM | POA: Diagnosis not present

## 2014-10-18 DIAGNOSIS — F332 Major depressive disorder, recurrent severe without psychotic features: Secondary | ICD-10-CM | POA: Diagnosis not present

## 2014-10-22 DIAGNOSIS — F331 Major depressive disorder, recurrent, moderate: Secondary | ICD-10-CM | POA: Diagnosis not present

## 2014-10-25 DIAGNOSIS — F331 Major depressive disorder, recurrent, moderate: Secondary | ICD-10-CM | POA: Diagnosis not present

## 2014-10-27 DIAGNOSIS — F331 Major depressive disorder, recurrent, moderate: Secondary | ICD-10-CM | POA: Diagnosis not present

## 2014-10-30 DIAGNOSIS — F331 Major depressive disorder, recurrent, moderate: Secondary | ICD-10-CM | POA: Diagnosis not present

## 2014-11-05 DIAGNOSIS — F331 Major depressive disorder, recurrent, moderate: Secondary | ICD-10-CM | POA: Diagnosis not present

## 2014-11-07 DIAGNOSIS — F331 Major depressive disorder, recurrent, moderate: Secondary | ICD-10-CM | POA: Diagnosis not present

## 2014-11-10 DIAGNOSIS — F331 Major depressive disorder, recurrent, moderate: Secondary | ICD-10-CM | POA: Diagnosis not present

## 2014-11-14 DIAGNOSIS — F332 Major depressive disorder, recurrent severe without psychotic features: Secondary | ICD-10-CM | POA: Diagnosis not present

## 2014-11-17 DIAGNOSIS — F331 Major depressive disorder, recurrent, moderate: Secondary | ICD-10-CM | POA: Diagnosis not present

## 2014-11-18 DIAGNOSIS — R1314 Dysphagia, pharyngoesophageal phase: Secondary | ICD-10-CM | POA: Diagnosis not present

## 2014-11-19 DIAGNOSIS — R1314 Dysphagia, pharyngoesophageal phase: Secondary | ICD-10-CM | POA: Diagnosis not present

## 2014-11-20 DIAGNOSIS — R1314 Dysphagia, pharyngoesophageal phase: Secondary | ICD-10-CM | POA: Diagnosis not present

## 2014-11-21 DIAGNOSIS — F331 Major depressive disorder, recurrent, moderate: Secondary | ICD-10-CM | POA: Diagnosis not present

## 2014-11-21 DIAGNOSIS — R1314 Dysphagia, pharyngoesophageal phase: Secondary | ICD-10-CM | POA: Diagnosis not present

## 2014-11-24 DIAGNOSIS — G4700 Insomnia, unspecified: Secondary | ICD-10-CM | POA: Diagnosis not present

## 2014-11-24 DIAGNOSIS — R1314 Dysphagia, pharyngoesophageal phase: Secondary | ICD-10-CM | POA: Diagnosis not present

## 2014-11-24 DIAGNOSIS — F411 Generalized anxiety disorder: Secondary | ICD-10-CM | POA: Diagnosis not present

## 2014-11-24 DIAGNOSIS — F329 Major depressive disorder, single episode, unspecified: Secondary | ICD-10-CM | POA: Diagnosis not present

## 2014-11-24 DIAGNOSIS — F331 Major depressive disorder, recurrent, moderate: Secondary | ICD-10-CM | POA: Diagnosis not present

## 2014-11-25 DIAGNOSIS — R1314 Dysphagia, pharyngoesophageal phase: Secondary | ICD-10-CM | POA: Diagnosis not present

## 2014-11-26 DIAGNOSIS — R1314 Dysphagia, pharyngoesophageal phase: Secondary | ICD-10-CM | POA: Diagnosis not present

## 2014-11-26 DIAGNOSIS — F331 Major depressive disorder, recurrent, moderate: Secondary | ICD-10-CM | POA: Diagnosis not present

## 2014-11-27 DIAGNOSIS — R1314 Dysphagia, pharyngoesophageal phase: Secondary | ICD-10-CM | POA: Diagnosis not present

## 2014-11-28 DIAGNOSIS — R1314 Dysphagia, pharyngoesophageal phase: Secondary | ICD-10-CM | POA: Diagnosis not present

## 2014-11-30 DIAGNOSIS — R05 Cough: Secondary | ICD-10-CM | POA: Diagnosis not present

## 2014-12-01 DIAGNOSIS — R1314 Dysphagia, pharyngoesophageal phase: Secondary | ICD-10-CM | POA: Diagnosis not present

## 2014-12-01 DIAGNOSIS — F331 Major depressive disorder, recurrent, moderate: Secondary | ICD-10-CM | POA: Diagnosis not present

## 2014-12-02 DIAGNOSIS — R1314 Dysphagia, pharyngoesophageal phase: Secondary | ICD-10-CM | POA: Diagnosis not present

## 2014-12-03 DIAGNOSIS — M6281 Muscle weakness (generalized): Secondary | ICD-10-CM | POA: Diagnosis not present

## 2014-12-03 DIAGNOSIS — R1314 Dysphagia, pharyngoesophageal phase: Secondary | ICD-10-CM | POA: Diagnosis not present

## 2014-12-03 DIAGNOSIS — F332 Major depressive disorder, recurrent severe without psychotic features: Secondary | ICD-10-CM | POA: Diagnosis not present

## 2014-12-03 DIAGNOSIS — R531 Weakness: Secondary | ICD-10-CM | POA: Diagnosis not present

## 2014-12-03 DIAGNOSIS — R278 Other lack of coordination: Secondary | ICD-10-CM | POA: Diagnosis not present

## 2014-12-03 DIAGNOSIS — J189 Pneumonia, unspecified organism: Secondary | ICD-10-CM | POA: Diagnosis not present

## 2014-12-04 DIAGNOSIS — J189 Pneumonia, unspecified organism: Secondary | ICD-10-CM | POA: Diagnosis not present

## 2014-12-04 DIAGNOSIS — R1314 Dysphagia, pharyngoesophageal phase: Secondary | ICD-10-CM | POA: Diagnosis not present

## 2014-12-04 DIAGNOSIS — R531 Weakness: Secondary | ICD-10-CM | POA: Diagnosis not present

## 2014-12-04 DIAGNOSIS — R278 Other lack of coordination: Secondary | ICD-10-CM | POA: Diagnosis not present

## 2014-12-04 DIAGNOSIS — M6281 Muscle weakness (generalized): Secondary | ICD-10-CM | POA: Diagnosis not present

## 2014-12-05 DIAGNOSIS — M6281 Muscle weakness (generalized): Secondary | ICD-10-CM | POA: Diagnosis not present

## 2014-12-05 DIAGNOSIS — R278 Other lack of coordination: Secondary | ICD-10-CM | POA: Diagnosis not present

## 2014-12-05 DIAGNOSIS — R1314 Dysphagia, pharyngoesophageal phase: Secondary | ICD-10-CM | POA: Diagnosis not present

## 2014-12-05 DIAGNOSIS — R531 Weakness: Secondary | ICD-10-CM | POA: Diagnosis not present

## 2014-12-05 DIAGNOSIS — J189 Pneumonia, unspecified organism: Secondary | ICD-10-CM | POA: Diagnosis not present

## 2014-12-08 DIAGNOSIS — R1314 Dysphagia, pharyngoesophageal phase: Secondary | ICD-10-CM | POA: Diagnosis not present

## 2014-12-08 DIAGNOSIS — R278 Other lack of coordination: Secondary | ICD-10-CM | POA: Diagnosis not present

## 2014-12-08 DIAGNOSIS — J189 Pneumonia, unspecified organism: Secondary | ICD-10-CM | POA: Diagnosis not present

## 2014-12-08 DIAGNOSIS — M6281 Muscle weakness (generalized): Secondary | ICD-10-CM | POA: Diagnosis not present

## 2014-12-08 DIAGNOSIS — R531 Weakness: Secondary | ICD-10-CM | POA: Diagnosis not present

## 2014-12-08 DIAGNOSIS — F332 Major depressive disorder, recurrent severe without psychotic features: Secondary | ICD-10-CM | POA: Diagnosis not present

## 2014-12-09 DIAGNOSIS — J189 Pneumonia, unspecified organism: Secondary | ICD-10-CM | POA: Diagnosis not present

## 2014-12-09 DIAGNOSIS — R278 Other lack of coordination: Secondary | ICD-10-CM | POA: Diagnosis not present

## 2014-12-09 DIAGNOSIS — R1314 Dysphagia, pharyngoesophageal phase: Secondary | ICD-10-CM | POA: Diagnosis not present

## 2014-12-09 DIAGNOSIS — M6281 Muscle weakness (generalized): Secondary | ICD-10-CM | POA: Diagnosis not present

## 2014-12-09 DIAGNOSIS — R531 Weakness: Secondary | ICD-10-CM | POA: Diagnosis not present

## 2014-12-10 DIAGNOSIS — M6281 Muscle weakness (generalized): Secondary | ICD-10-CM | POA: Diagnosis not present

## 2014-12-10 DIAGNOSIS — J189 Pneumonia, unspecified organism: Secondary | ICD-10-CM | POA: Diagnosis not present

## 2014-12-10 DIAGNOSIS — R531 Weakness: Secondary | ICD-10-CM | POA: Diagnosis not present

## 2014-12-10 DIAGNOSIS — R278 Other lack of coordination: Secondary | ICD-10-CM | POA: Diagnosis not present

## 2014-12-10 DIAGNOSIS — R1314 Dysphagia, pharyngoesophageal phase: Secondary | ICD-10-CM | POA: Diagnosis not present

## 2014-12-11 DIAGNOSIS — R278 Other lack of coordination: Secondary | ICD-10-CM | POA: Diagnosis not present

## 2014-12-11 DIAGNOSIS — J189 Pneumonia, unspecified organism: Secondary | ICD-10-CM | POA: Diagnosis not present

## 2014-12-11 DIAGNOSIS — R1314 Dysphagia, pharyngoesophageal phase: Secondary | ICD-10-CM | POA: Diagnosis not present

## 2014-12-11 DIAGNOSIS — R531 Weakness: Secondary | ICD-10-CM | POA: Diagnosis not present

## 2014-12-11 DIAGNOSIS — M6281 Muscle weakness (generalized): Secondary | ICD-10-CM | POA: Diagnosis not present

## 2014-12-12 DIAGNOSIS — M6281 Muscle weakness (generalized): Secondary | ICD-10-CM | POA: Diagnosis not present

## 2014-12-12 DIAGNOSIS — R278 Other lack of coordination: Secondary | ICD-10-CM | POA: Diagnosis not present

## 2014-12-12 DIAGNOSIS — J189 Pneumonia, unspecified organism: Secondary | ICD-10-CM | POA: Diagnosis not present

## 2014-12-12 DIAGNOSIS — R1314 Dysphagia, pharyngoesophageal phase: Secondary | ICD-10-CM | POA: Diagnosis not present

## 2014-12-12 DIAGNOSIS — R531 Weakness: Secondary | ICD-10-CM | POA: Diagnosis not present

## 2014-12-12 DIAGNOSIS — F332 Major depressive disorder, recurrent severe without psychotic features: Secondary | ICD-10-CM | POA: Diagnosis not present

## 2014-12-13 DIAGNOSIS — R1314 Dysphagia, pharyngoesophageal phase: Secondary | ICD-10-CM | POA: Diagnosis not present

## 2014-12-13 DIAGNOSIS — J189 Pneumonia, unspecified organism: Secondary | ICD-10-CM | POA: Diagnosis not present

## 2014-12-13 DIAGNOSIS — R531 Weakness: Secondary | ICD-10-CM | POA: Diagnosis not present

## 2014-12-13 DIAGNOSIS — M6281 Muscle weakness (generalized): Secondary | ICD-10-CM | POA: Diagnosis not present

## 2014-12-13 DIAGNOSIS — R278 Other lack of coordination: Secondary | ICD-10-CM | POA: Diagnosis not present

## 2014-12-15 DIAGNOSIS — J189 Pneumonia, unspecified organism: Secondary | ICD-10-CM | POA: Diagnosis not present

## 2014-12-15 DIAGNOSIS — R1314 Dysphagia, pharyngoesophageal phase: Secondary | ICD-10-CM | POA: Diagnosis not present

## 2014-12-15 DIAGNOSIS — R278 Other lack of coordination: Secondary | ICD-10-CM | POA: Diagnosis not present

## 2014-12-15 DIAGNOSIS — R531 Weakness: Secondary | ICD-10-CM | POA: Diagnosis not present

## 2014-12-15 DIAGNOSIS — M6281 Muscle weakness (generalized): Secondary | ICD-10-CM | POA: Diagnosis not present

## 2014-12-16 DIAGNOSIS — R531 Weakness: Secondary | ICD-10-CM | POA: Diagnosis not present

## 2014-12-16 DIAGNOSIS — J189 Pneumonia, unspecified organism: Secondary | ICD-10-CM | POA: Diagnosis not present

## 2014-12-16 DIAGNOSIS — R278 Other lack of coordination: Secondary | ICD-10-CM | POA: Diagnosis not present

## 2014-12-16 DIAGNOSIS — R1314 Dysphagia, pharyngoesophageal phase: Secondary | ICD-10-CM | POA: Diagnosis not present

## 2014-12-16 DIAGNOSIS — M6281 Muscle weakness (generalized): Secondary | ICD-10-CM | POA: Diagnosis not present

## 2014-12-17 DIAGNOSIS — M6281 Muscle weakness (generalized): Secondary | ICD-10-CM | POA: Diagnosis not present

## 2014-12-17 DIAGNOSIS — R531 Weakness: Secondary | ICD-10-CM | POA: Diagnosis not present

## 2014-12-17 DIAGNOSIS — R1314 Dysphagia, pharyngoesophageal phase: Secondary | ICD-10-CM | POA: Diagnosis not present

## 2014-12-17 DIAGNOSIS — F332 Major depressive disorder, recurrent severe without psychotic features: Secondary | ICD-10-CM | POA: Diagnosis not present

## 2014-12-17 DIAGNOSIS — J189 Pneumonia, unspecified organism: Secondary | ICD-10-CM | POA: Diagnosis not present

## 2014-12-17 DIAGNOSIS — R278 Other lack of coordination: Secondary | ICD-10-CM | POA: Diagnosis not present

## 2014-12-18 DIAGNOSIS — J189 Pneumonia, unspecified organism: Secondary | ICD-10-CM | POA: Diagnosis not present

## 2014-12-18 DIAGNOSIS — M6281 Muscle weakness (generalized): Secondary | ICD-10-CM | POA: Diagnosis not present

## 2014-12-18 DIAGNOSIS — R278 Other lack of coordination: Secondary | ICD-10-CM | POA: Diagnosis not present

## 2014-12-18 DIAGNOSIS — R531 Weakness: Secondary | ICD-10-CM | POA: Diagnosis not present

## 2014-12-18 DIAGNOSIS — R1314 Dysphagia, pharyngoesophageal phase: Secondary | ICD-10-CM | POA: Diagnosis not present

## 2014-12-19 DIAGNOSIS — M6281 Muscle weakness (generalized): Secondary | ICD-10-CM | POA: Diagnosis not present

## 2014-12-19 DIAGNOSIS — R278 Other lack of coordination: Secondary | ICD-10-CM | POA: Diagnosis not present

## 2014-12-19 DIAGNOSIS — F332 Major depressive disorder, recurrent severe without psychotic features: Secondary | ICD-10-CM | POA: Diagnosis not present

## 2014-12-19 DIAGNOSIS — J189 Pneumonia, unspecified organism: Secondary | ICD-10-CM | POA: Diagnosis not present

## 2014-12-19 DIAGNOSIS — R1314 Dysphagia, pharyngoesophageal phase: Secondary | ICD-10-CM | POA: Diagnosis not present

## 2014-12-19 DIAGNOSIS — R531 Weakness: Secondary | ICD-10-CM | POA: Diagnosis not present

## 2014-12-22 DIAGNOSIS — M6281 Muscle weakness (generalized): Secondary | ICD-10-CM | POA: Diagnosis not present

## 2014-12-22 DIAGNOSIS — J189 Pneumonia, unspecified organism: Secondary | ICD-10-CM | POA: Diagnosis not present

## 2014-12-22 DIAGNOSIS — R531 Weakness: Secondary | ICD-10-CM | POA: Diagnosis not present

## 2014-12-22 DIAGNOSIS — R1314 Dysphagia, pharyngoesophageal phase: Secondary | ICD-10-CM | POA: Diagnosis not present

## 2014-12-22 DIAGNOSIS — R278 Other lack of coordination: Secondary | ICD-10-CM | POA: Diagnosis not present

## 2014-12-23 ENCOUNTER — Encounter: Payer: Self-pay | Admitting: Cardiology

## 2014-12-23 ENCOUNTER — Ambulatory Visit (INDEPENDENT_AMBULATORY_CARE_PROVIDER_SITE_OTHER): Payer: Medicare Other | Admitting: Cardiology

## 2014-12-23 ENCOUNTER — Encounter: Payer: Self-pay | Admitting: *Deleted

## 2014-12-23 VITALS — BP 100/60 | HR 65 | Ht 59.0 in | Wt 117.0 lb

## 2014-12-23 DIAGNOSIS — E785 Hyperlipidemia, unspecified: Secondary | ICD-10-CM

## 2014-12-23 DIAGNOSIS — R1314 Dysphagia, pharyngoesophageal phase: Secondary | ICD-10-CM | POA: Diagnosis not present

## 2014-12-23 DIAGNOSIS — I10 Essential (primary) hypertension: Secondary | ICD-10-CM

## 2014-12-23 DIAGNOSIS — I2510 Atherosclerotic heart disease of native coronary artery without angina pectoris: Secondary | ICD-10-CM | POA: Diagnosis not present

## 2014-12-23 DIAGNOSIS — R531 Weakness: Secondary | ICD-10-CM | POA: Diagnosis not present

## 2014-12-23 DIAGNOSIS — M6281 Muscle weakness (generalized): Secondary | ICD-10-CM | POA: Diagnosis not present

## 2014-12-23 DIAGNOSIS — R278 Other lack of coordination: Secondary | ICD-10-CM | POA: Diagnosis not present

## 2014-12-23 DIAGNOSIS — J189 Pneumonia, unspecified organism: Secondary | ICD-10-CM | POA: Diagnosis not present

## 2014-12-23 NOTE — Patient Instructions (Signed)
Your physician wants you to follow-up in: 6 months with Dr. Lurena Joiner will receive a reminder letter in the mail two months in advance. If you don't receive a letter, please call our office to schedule the follow-up appointment.  Your physician recommends that you continue on your current medications as directed. Please refer to the Current Medication list given to you today.  WE WILL REQUEST LABS FROM DR. SASSER  Thank you for choosing [facility]!!

## 2014-12-23 NOTE — Progress Notes (Signed)
Clinical Summary Ms. Bachmann is a 81 y.o.female seen today for follow up of the following medical problems.   1. CAD  - prior MI in 1997, non-obstructive disease by cath at that time.  - echo 2008 LVEF 60%, mild AI  - denies any exertionalchest pain  - history of ASA allergy, has not been taking - denies any DOE. Notes some LE edema at times.  2. HTN  - compliant with meds   3. Hyperlipidemia  - compliant statin, no recent panel in our system. Has typically been checked by Dr Almira Bar    Past Medical History  Diagnosis Date  . MI, acute, non ST segment elevation 1997    Questionable lesion in the circumflex at catheterization; normal EF; chronic dyspnea without specific cause identified  . Hyperlipidemia   . Hypertension   . Postmenopausal     Estrogen replacement therapy  . Cough     Prolonged, possible asthmatic bronchitis  . Benign essential tremor   . Osteopenia     Versus osteoporosis  . Macular degeneration   . Degenerative joint disease     versus rheumatoid arthritis  . Repeated falls      Allergies  Allergen Reactions  . Aspirin     GI symptoms  . Bee Venom   . Morphine And Related   . Sulfa Antibiotics     dyspnea  . Tetracyclines & Related   . Codeine Rash  . Pamelor Rash  . Penicillins Rash     Current Outpatient Prescriptions  Medication Sig Dispense Refill  . acetaminophen (TYLENOL) 325 MG tablet Take 650 mg by mouth 2 (two) times daily as needed.     [name] albuterol (ACCUNEB) 1.25 MG/3ML nebulizer solution Take 1 ampule by nebulization every 4 (four) hours as needed for wheezing.    [name] ALPRAZolam (XANAX) 0.25 MG tablet Take 0.25 mg by mouth daily. For anxiety    . alum & mag hydroxide-simeth (MYLANTA) 200-200-20 MG/5ML suspension Take 15 mLs by mouth at bedtime.    [name] aspirin 81 MG tablet Take 81 mg by mouth daily.      . beclomethasone (QVAR) 80 MCG/ACT inhaler Inhale 1 puff into the lungs 2 (two) times daily.      . Calcium  Carb-Cholecalciferol (CALCIUM 500 +D) 500-400 MG-UNIT TABS Take 1 tablet by mouth daily.    . citalopram (CELEXA) 20 MG tablet Take 1 tablet by mouth daily.    [name] dextromethorphan (DELSYM) 30 MG/5ML liquid Take 60 mg by mouth 2 (two) times daily as needed. For  cough    . fluticasone (FLONASE) 50 MCG/ACT nasal spray Place 2 sprays into the nose daily.      . furosemide (LASIX) 40 MG tablet Take 40 mg by mouth daily.     . furosemide (LASIX) 40 MG tablet Take 1 tablet (40 mg total) by mouth daily. (Patient taking differently: Take 40 mg by mouth as needed. ) 90 tablet 3  . GUAIFENESIN PO Take 600 mg by mouth 2 (two) times daily.    [name] ipratropium-albuterol (DUONEB) 0.5-2.5 (3) MG/3ML SOLN Take 3 mLs by nebulization at bedtime.     [name] KLOR-CON M20 20 MEQ tablet Take 1 tablet by mouth daily.    [name] loratadine (CLARITIN) 10 MG tablet Take 10 mg by mouth daily.    . Multiple Vitamin (MULTIVITAMIN) tablet Take 1 tablet by mouth daily.      . nabumetone (RELAFEN) 500 MG tablet Take 500 mg by  mouth daily.     . nitroGLYCERIN (NITROSTAT) 0.4 MG SL tablet Place 0.4 mg under the tongue as directed.      [name] omeprazole (PRILOSEC) 20 MG capsule Take 1 capsule by mouth daily.    Bertram Gala Glycol-Propyl Glycol (SYSTANE) 0.4-0.3 % SOLN Apply 1 drop to eye 3 (three) times daily.      . polyethylene glycol powder (GLYCOLAX/MIRALAX) powder Take 17 g by mouth daily.    . pravastatin (PRAVACHOL) 40 MG tablet Take 40 mg by mouth at bedtime.      . promethazine (PHENERGAN) 25 MG tablet Take 12.5 mg by mouth every 4 (four) hours as needed. For nausea    . traMADol (ULTRAM) 50 MG tablet Take 50 mg by mouth every 6 (six) hours as needed. For pain     No current facility-administered medications for this visit.     Past Surgical History  Procedure Laterality Date  . Bladder suspension    . Inguinal hernia repair      Left  . Foot surgery      Bilateral  . Ercp  04/18/2012    Procedure: ENDOSCOPIC RETROGRADE  CHOLANGIOPANCREATOGRAPHY (ERCP);  Surgeon: Malissa Hippo, MD;  Location: [facility];  Service: Endoscopy;  Laterality: N/A;  Diagnostic     Allergies  Allergen Reactions  . Aspirin     GI symptoms  . Bee Venom   . Morphine And Related   . Sulfa Antibiotics     dyspnea  . Tetracyclines & Related   . Codeine Rash  . Pamelor Rash  . Penicillins Rash      Family History  Problem Relation Age of Onset  . Coronary artery disease    . Stroke       Social History Ms. Dohner reports that she has never smoked. She has never used smokeless tobacco. Ms. Casteneda reports that she does not drink alcohol.   Review of Systems CONSTITUTIONAL: No weight loss, fever, chills, weakness or fatigue.  HEENT: Eyes: No visual loss, blurred vision, double vision or yellow sclerae.No hearing loss, sneezing, congestion, runny nose or sore throat.  SKIN: No rash or itching.  CARDIOVASCULAR: per HPI RESPIRATORY: No shortness of breath, cough or sputum.  GASTROINTESTINAL: No anorexia, nausea, vomiting or diarrhea. No abdominal pain or blood.  GENITOURINARY: No burning on urination, no polyuria NEUROLOGICAL: No headache, dizziness, syncope, paralysis, ataxia, numbness or tingling in the extremities. No change in bowel or bladder control.  MUSCULOSKELETAL: No muscle, back pain, joint pain or stiffness.  LYMPHATICS: No enlarged nodes. No history of splenectomy.  PSYCHIATRIC: No history of depression or anxiety.  ENDOCRINOLOGIC: No reports of sweating, cold or heat intolerance. No polyuria or polydipsia.  [name]   Physical Examination Filed Vitals:   12/23/14 0850  BP: 100/60  Pulse: 65   Filed Vitals:   12/23/14 0850  Height: 4\' 11"  (1.499 m)  Weight: 117 lb (53.071 kg)    Gen: resting comfortably, no acute distress HEENT: no scleral icterus, pupils equal round and reactive, no palptable cervical adenopathy,  CV: RRR, no m/r/g, no JVD, no carotid bruits Resp: Clear to auscultation  bilaterally GI: abdomen is soft, non-tender, non-distended, normal bowel sounds, no hepatosplenomegaly MSK: extremities are warm, no edema.  Skin: warm, no rash Neuro:  no focal deficits Psych: appropriate affect   Diagnostic Studies 10/29/13 Echo Study Conclusions  - Left ventricle: The cavity size was normal. Wall thickness was increased in a pattern of mild LVH. Systolic function was normal.  The estimated ejection fraction was in the range of 55% to 60%. Wall motion was normal; there were no regional wall motion abnormalities. There was an increased relative contribution of atrial contraction to ventricular filling. Doppler parameters are consistent with abnormal left ventricular relaxation (grade 1 diastolic dysfunction). - Aortic valve: Mildly calcified annulus. Trileaflet; mildly thickened, mildly calcified leaflets. Moderate, centralregurgitation. Minimal aortic stenosis. Peak velocity: 205cm/s (S). Mean gradient: 43mm Hg (S). Valve area: 1.85cm^2(VTI). - Mitral valve: Calcified annulus. Mildly thickened leaflets . Mild regurgitation. - Left atrium: The atrium was mildly dilated. - Tricuspid valve: Mildly thickened leaflets. Mild-moderate regurgitation. - Pulmonary arteries: PA peak pressure: 73mm Hg (S). Mild to moderately elevated pulmonary pressures.   EKG reviewed today in clinic, NSR, low voltage, probable old anteroseptal infarct  Assessment and Plan   1. CAD  - no current chest pain  - continue secondary prevention and risk factor modification.   2. HTN  - at goal, continue current meds    3. Hyperlipidemia  - continue current statin, will request most recent labs from Dr Neita Carp   F/u 6 months     Antoine Poche, M.D.

## 2014-12-24 DIAGNOSIS — R1314 Dysphagia, pharyngoesophageal phase: Secondary | ICD-10-CM | POA: Diagnosis not present

## 2014-12-24 DIAGNOSIS — R278 Other lack of coordination: Secondary | ICD-10-CM | POA: Diagnosis not present

## 2014-12-24 DIAGNOSIS — M6281 Muscle weakness (generalized): Secondary | ICD-10-CM | POA: Diagnosis not present

## 2014-12-24 DIAGNOSIS — F332 Major depressive disorder, recurrent severe without psychotic features: Secondary | ICD-10-CM | POA: Diagnosis not present

## 2014-12-24 DIAGNOSIS — J189 Pneumonia, unspecified organism: Secondary | ICD-10-CM | POA: Diagnosis not present

## 2014-12-24 DIAGNOSIS — R531 Weakness: Secondary | ICD-10-CM | POA: Diagnosis not present

## 2014-12-25 DIAGNOSIS — R531 Weakness: Secondary | ICD-10-CM | POA: Diagnosis not present

## 2014-12-25 DIAGNOSIS — R278 Other lack of coordination: Secondary | ICD-10-CM | POA: Diagnosis not present

## 2014-12-25 DIAGNOSIS — J189 Pneumonia, unspecified organism: Secondary | ICD-10-CM | POA: Diagnosis not present

## 2014-12-25 DIAGNOSIS — R1314 Dysphagia, pharyngoesophageal phase: Secondary | ICD-10-CM | POA: Diagnosis not present

## 2014-12-25 DIAGNOSIS — M6281 Muscle weakness (generalized): Secondary | ICD-10-CM | POA: Diagnosis not present

## 2014-12-26 DIAGNOSIS — R531 Weakness: Secondary | ICD-10-CM | POA: Diagnosis not present

## 2014-12-26 DIAGNOSIS — J189 Pneumonia, unspecified organism: Secondary | ICD-10-CM | POA: Diagnosis not present

## 2014-12-26 DIAGNOSIS — R278 Other lack of coordination: Secondary | ICD-10-CM | POA: Diagnosis not present

## 2014-12-26 DIAGNOSIS — M6281 Muscle weakness (generalized): Secondary | ICD-10-CM | POA: Diagnosis not present

## 2014-12-26 DIAGNOSIS — R1314 Dysphagia, pharyngoesophageal phase: Secondary | ICD-10-CM | POA: Diagnosis not present

## 2014-12-26 DIAGNOSIS — F332 Major depressive disorder, recurrent severe without psychotic features: Secondary | ICD-10-CM | POA: Diagnosis not present

## 2014-12-29 DIAGNOSIS — M6281 Muscle weakness (generalized): Secondary | ICD-10-CM | POA: Diagnosis not present

## 2014-12-29 DIAGNOSIS — R531 Weakness: Secondary | ICD-10-CM | POA: Diagnosis not present

## 2014-12-29 DIAGNOSIS — R278 Other lack of coordination: Secondary | ICD-10-CM | POA: Diagnosis not present

## 2014-12-29 DIAGNOSIS — R1314 Dysphagia, pharyngoesophageal phase: Secondary | ICD-10-CM | POA: Diagnosis not present

## 2014-12-29 DIAGNOSIS — J189 Pneumonia, unspecified organism: Secondary | ICD-10-CM | POA: Diagnosis not present

## 2014-12-30 DIAGNOSIS — R278 Other lack of coordination: Secondary | ICD-10-CM | POA: Diagnosis not present

## 2014-12-30 DIAGNOSIS — J189 Pneumonia, unspecified organism: Secondary | ICD-10-CM | POA: Diagnosis not present

## 2014-12-30 DIAGNOSIS — R531 Weakness: Secondary | ICD-10-CM | POA: Diagnosis not present

## 2014-12-30 DIAGNOSIS — R1314 Dysphagia, pharyngoesophageal phase: Secondary | ICD-10-CM | POA: Diagnosis not present

## 2014-12-30 DIAGNOSIS — M6281 Muscle weakness (generalized): Secondary | ICD-10-CM | POA: Diagnosis not present

## 2014-12-31 DIAGNOSIS — R1314 Dysphagia, pharyngoesophageal phase: Secondary | ICD-10-CM | POA: Diagnosis not present

## 2014-12-31 DIAGNOSIS — J189 Pneumonia, unspecified organism: Secondary | ICD-10-CM | POA: Diagnosis not present

## 2014-12-31 DIAGNOSIS — F332 Major depressive disorder, recurrent severe without psychotic features: Secondary | ICD-10-CM | POA: Diagnosis not present

## 2014-12-31 DIAGNOSIS — R531 Weakness: Secondary | ICD-10-CM | POA: Diagnosis not present

## 2014-12-31 DIAGNOSIS — R278 Other lack of coordination: Secondary | ICD-10-CM | POA: Diagnosis not present

## 2014-12-31 DIAGNOSIS — M6281 Muscle weakness (generalized): Secondary | ICD-10-CM | POA: Diagnosis not present

## 2015-01-01 DIAGNOSIS — R1314 Dysphagia, pharyngoesophageal phase: Secondary | ICD-10-CM | POA: Diagnosis not present

## 2015-01-01 DIAGNOSIS — R600 Localized edema: Secondary | ICD-10-CM | POA: Diagnosis not present

## 2015-01-01 DIAGNOSIS — J189 Pneumonia, unspecified organism: Secondary | ICD-10-CM | POA: Diagnosis not present

## 2015-01-01 DIAGNOSIS — R531 Weakness: Secondary | ICD-10-CM | POA: Diagnosis not present

## 2015-01-01 DIAGNOSIS — R278 Other lack of coordination: Secondary | ICD-10-CM | POA: Diagnosis not present

## 2015-01-01 DIAGNOSIS — M6281 Muscle weakness (generalized): Secondary | ICD-10-CM | POA: Diagnosis not present

## 2015-01-02 DIAGNOSIS — K839 Disease of biliary tract, unspecified: Secondary | ICD-10-CM | POA: Diagnosis not present

## 2015-01-02 DIAGNOSIS — I4891 Unspecified atrial fibrillation: Secondary | ICD-10-CM | POA: Diagnosis not present

## 2015-01-02 DIAGNOSIS — I10 Essential (primary) hypertension: Secondary | ICD-10-CM | POA: Diagnosis not present

## 2015-01-02 DIAGNOSIS — R1314 Dysphagia, pharyngoesophageal phase: Secondary | ICD-10-CM | POA: Diagnosis not present

## 2015-01-02 DIAGNOSIS — F332 Major depressive disorder, recurrent severe without psychotic features: Secondary | ICD-10-CM | POA: Diagnosis not present

## 2015-01-02 DIAGNOSIS — E784 Other hyperlipidemia: Secondary | ICD-10-CM | POA: Diagnosis not present

## 2015-01-02 DIAGNOSIS — M069 Rheumatoid arthritis, unspecified: Secondary | ICD-10-CM | POA: Diagnosis not present

## 2015-01-02 DIAGNOSIS — R279 Unspecified lack of coordination: Secondary | ICD-10-CM | POA: Diagnosis not present

## 2015-01-02 DIAGNOSIS — H548 Legal blindness, as defined in USA: Secondary | ICD-10-CM | POA: Diagnosis not present

## 2015-01-02 DIAGNOSIS — I2510 Atherosclerotic heart disease of native coronary artery without angina pectoris: Secondary | ICD-10-CM | POA: Diagnosis not present

## 2015-01-02 DIAGNOSIS — I214 Non-ST elevation (NSTEMI) myocardial infarction: Secondary | ICD-10-CM | POA: Diagnosis not present

## 2015-01-02 DIAGNOSIS — H3530 Unspecified macular degeneration: Secondary | ICD-10-CM | POA: Diagnosis not present

## 2015-01-02 DIAGNOSIS — K210 Gastro-esophageal reflux disease with esophagitis: Secondary | ICD-10-CM | POA: Diagnosis not present

## 2015-01-02 DIAGNOSIS — F329 Major depressive disorder, single episode, unspecified: Secondary | ICD-10-CM | POA: Diagnosis not present

## 2015-01-02 DIAGNOSIS — J449 Chronic obstructive pulmonary disease, unspecified: Secondary | ICD-10-CM | POA: Diagnosis not present

## 2015-01-02 DIAGNOSIS — M159 Polyosteoarthritis, unspecified: Secondary | ICD-10-CM | POA: Diagnosis not present

## 2015-01-05 DIAGNOSIS — I4891 Unspecified atrial fibrillation: Secondary | ICD-10-CM | POA: Diagnosis not present

## 2015-01-05 DIAGNOSIS — M069 Rheumatoid arthritis, unspecified: Secondary | ICD-10-CM | POA: Diagnosis not present

## 2015-01-05 DIAGNOSIS — K839 Disease of biliary tract, unspecified: Secondary | ICD-10-CM | POA: Diagnosis not present

## 2015-01-05 DIAGNOSIS — M159 Polyosteoarthritis, unspecified: Secondary | ICD-10-CM | POA: Diagnosis not present

## 2015-01-05 DIAGNOSIS — I214 Non-ST elevation (NSTEMI) myocardial infarction: Secondary | ICD-10-CM | POA: Diagnosis not present

## 2015-01-05 DIAGNOSIS — I2510 Atherosclerotic heart disease of native coronary artery without angina pectoris: Secondary | ICD-10-CM | POA: Diagnosis not present

## 2015-01-06 DIAGNOSIS — I214 Non-ST elevation (NSTEMI) myocardial infarction: Secondary | ICD-10-CM | POA: Diagnosis not present

## 2015-01-06 DIAGNOSIS — I2510 Atherosclerotic heart disease of native coronary artery without angina pectoris: Secondary | ICD-10-CM | POA: Diagnosis not present

## 2015-01-06 DIAGNOSIS — I4891 Unspecified atrial fibrillation: Secondary | ICD-10-CM | POA: Diagnosis not present

## 2015-01-06 DIAGNOSIS — M069 Rheumatoid arthritis, unspecified: Secondary | ICD-10-CM | POA: Diagnosis not present

## 2015-01-06 DIAGNOSIS — F332 Major depressive disorder, recurrent severe without psychotic features: Secondary | ICD-10-CM | POA: Diagnosis not present

## 2015-01-06 DIAGNOSIS — M159 Polyosteoarthritis, unspecified: Secondary | ICD-10-CM | POA: Diagnosis not present

## 2015-01-06 DIAGNOSIS — K839 Disease of biliary tract, unspecified: Secondary | ICD-10-CM | POA: Diagnosis not present

## 2015-01-07 DIAGNOSIS — M159 Polyosteoarthritis, unspecified: Secondary | ICD-10-CM | POA: Diagnosis not present

## 2015-01-07 DIAGNOSIS — I214 Non-ST elevation (NSTEMI) myocardial infarction: Secondary | ICD-10-CM | POA: Diagnosis not present

## 2015-01-07 DIAGNOSIS — M069 Rheumatoid arthritis, unspecified: Secondary | ICD-10-CM | POA: Diagnosis not present

## 2015-01-07 DIAGNOSIS — I2510 Atherosclerotic heart disease of native coronary artery without angina pectoris: Secondary | ICD-10-CM | POA: Diagnosis not present

## 2015-01-07 DIAGNOSIS — K839 Disease of biliary tract, unspecified: Secondary | ICD-10-CM | POA: Diagnosis not present

## 2015-01-07 DIAGNOSIS — I4891 Unspecified atrial fibrillation: Secondary | ICD-10-CM | POA: Diagnosis not present

## 2015-01-08 DIAGNOSIS — M159 Polyosteoarthritis, unspecified: Secondary | ICD-10-CM | POA: Diagnosis not present

## 2015-01-08 DIAGNOSIS — K839 Disease of biliary tract, unspecified: Secondary | ICD-10-CM | POA: Diagnosis not present

## 2015-01-08 DIAGNOSIS — I214 Non-ST elevation (NSTEMI) myocardial infarction: Secondary | ICD-10-CM | POA: Diagnosis not present

## 2015-01-08 DIAGNOSIS — I4891 Unspecified atrial fibrillation: Secondary | ICD-10-CM | POA: Diagnosis not present

## 2015-01-08 DIAGNOSIS — M069 Rheumatoid arthritis, unspecified: Secondary | ICD-10-CM | POA: Diagnosis not present

## 2015-01-08 DIAGNOSIS — I2510 Atherosclerotic heart disease of native coronary artery without angina pectoris: Secondary | ICD-10-CM | POA: Diagnosis not present

## 2015-01-09 DIAGNOSIS — M069 Rheumatoid arthritis, unspecified: Secondary | ICD-10-CM | POA: Diagnosis not present

## 2015-01-09 DIAGNOSIS — F332 Major depressive disorder, recurrent severe without psychotic features: Secondary | ICD-10-CM | POA: Diagnosis not present

## 2015-01-09 DIAGNOSIS — I2510 Atherosclerotic heart disease of native coronary artery without angina pectoris: Secondary | ICD-10-CM | POA: Diagnosis not present

## 2015-01-09 DIAGNOSIS — I4891 Unspecified atrial fibrillation: Secondary | ICD-10-CM | POA: Diagnosis not present

## 2015-01-09 DIAGNOSIS — K839 Disease of biliary tract, unspecified: Secondary | ICD-10-CM | POA: Diagnosis not present

## 2015-01-09 DIAGNOSIS — I214 Non-ST elevation (NSTEMI) myocardial infarction: Secondary | ICD-10-CM | POA: Diagnosis not present

## 2015-01-09 DIAGNOSIS — M159 Polyosteoarthritis, unspecified: Secondary | ICD-10-CM | POA: Diagnosis not present

## 2015-01-13 DIAGNOSIS — B351 Tinea unguium: Secondary | ICD-10-CM | POA: Diagnosis not present

## 2015-01-13 DIAGNOSIS — M79675 Pain in left toe(s): Secondary | ICD-10-CM | POA: Diagnosis not present

## 2015-01-14 DIAGNOSIS — F332 Major depressive disorder, recurrent severe without psychotic features: Secondary | ICD-10-CM | POA: Diagnosis not present

## 2015-01-16 DIAGNOSIS — J189 Pneumonia, unspecified organism: Secondary | ICD-10-CM | POA: Diagnosis not present

## 2015-01-16 DIAGNOSIS — F332 Major depressive disorder, recurrent severe without psychotic features: Secondary | ICD-10-CM | POA: Diagnosis not present

## 2015-01-16 DIAGNOSIS — J90 Pleural effusion, not elsewhere classified: Secondary | ICD-10-CM | POA: Diagnosis not present

## 2015-01-19 DIAGNOSIS — M7989 Other specified soft tissue disorders: Secondary | ICD-10-CM | POA: Diagnosis not present

## 2015-01-19 DIAGNOSIS — F332 Major depressive disorder, recurrent severe without psychotic features: Secondary | ICD-10-CM | POA: Diagnosis not present

## 2015-01-20 DIAGNOSIS — I10 Essential (primary) hypertension: Secondary | ICD-10-CM | POA: Diagnosis not present

## 2015-01-20 DIAGNOSIS — R05 Cough: Secondary | ICD-10-CM | POA: Diagnosis not present

## 2015-01-20 DIAGNOSIS — J449 Chronic obstructive pulmonary disease, unspecified: Secondary | ICD-10-CM | POA: Diagnosis not present

## 2015-01-20 DIAGNOSIS — M069 Rheumatoid arthritis, unspecified: Secondary | ICD-10-CM | POA: Diagnosis not present

## 2015-01-21 DIAGNOSIS — F332 Major depressive disorder, recurrent severe without psychotic features: Secondary | ICD-10-CM | POA: Diagnosis not present

## 2015-01-25 DIAGNOSIS — J189 Pneumonia, unspecified organism: Secondary | ICD-10-CM | POA: Diagnosis not present

## 2015-01-25 DIAGNOSIS — F332 Major depressive disorder, recurrent severe without psychotic features: Secondary | ICD-10-CM | POA: Diagnosis not present

## 2015-01-30 DIAGNOSIS — F332 Major depressive disorder, recurrent severe without psychotic features: Secondary | ICD-10-CM | POA: Diagnosis not present

## 2015-02-04 DIAGNOSIS — F332 Major depressive disorder, recurrent severe without psychotic features: Secondary | ICD-10-CM | POA: Diagnosis not present

## 2015-02-06 DIAGNOSIS — E785 Hyperlipidemia, unspecified: Secondary | ICD-10-CM | POA: Diagnosis not present

## 2015-02-06 DIAGNOSIS — F332 Major depressive disorder, recurrent severe without psychotic features: Secondary | ICD-10-CM | POA: Diagnosis not present

## 2015-02-06 DIAGNOSIS — I10 Essential (primary) hypertension: Secondary | ICD-10-CM | POA: Diagnosis not present

## 2015-02-06 DIAGNOSIS — R740 Nonspecific elevation of levels of transaminase and lactic acid dehydrogenase [LDH]: Secondary | ICD-10-CM | POA: Diagnosis not present

## 2015-02-06 DIAGNOSIS — R770 Abnormality of albumin: Secondary | ICD-10-CM | POA: Diagnosis not present

## 2015-02-11 DIAGNOSIS — F332 Major depressive disorder, recurrent severe without psychotic features: Secondary | ICD-10-CM | POA: Diagnosis not present

## 2015-02-13 DIAGNOSIS — F332 Major depressive disorder, recurrent severe without psychotic features: Secondary | ICD-10-CM | POA: Diagnosis not present

## 2015-02-18 DIAGNOSIS — F332 Major depressive disorder, recurrent severe without psychotic features: Secondary | ICD-10-CM | POA: Diagnosis not present

## 2015-02-20 DIAGNOSIS — F332 Major depressive disorder, recurrent severe without psychotic features: Secondary | ICD-10-CM | POA: Diagnosis not present

## 2015-02-24 DIAGNOSIS — F332 Major depressive disorder, recurrent severe without psychotic features: Secondary | ICD-10-CM | POA: Diagnosis not present

## 2015-02-26 DIAGNOSIS — F329 Major depressive disorder, single episode, unspecified: Secondary | ICD-10-CM | POA: Diagnosis not present

## 2015-02-26 DIAGNOSIS — G4700 Insomnia, unspecified: Secondary | ICD-10-CM | POA: Diagnosis not present

## 2015-02-26 DIAGNOSIS — F411 Generalized anxiety disorder: Secondary | ICD-10-CM | POA: Diagnosis not present

## 2015-02-26 DIAGNOSIS — F332 Major depressive disorder, recurrent severe without psychotic features: Secondary | ICD-10-CM | POA: Diagnosis not present

## 2015-03-03 DIAGNOSIS — J449 Chronic obstructive pulmonary disease, unspecified: Secondary | ICD-10-CM | POA: Diagnosis not present

## 2015-03-03 DIAGNOSIS — J209 Acute bronchitis, unspecified: Secondary | ICD-10-CM | POA: Diagnosis not present

## 2015-03-03 DIAGNOSIS — M069 Rheumatoid arthritis, unspecified: Secondary | ICD-10-CM | POA: Diagnosis not present

## 2015-03-03 DIAGNOSIS — I10 Essential (primary) hypertension: Secondary | ICD-10-CM | POA: Diagnosis not present

## 2015-03-09 DIAGNOSIS — F332 Major depressive disorder, recurrent severe without psychotic features: Secondary | ICD-10-CM | POA: Diagnosis not present

## 2015-03-10 DIAGNOSIS — H3530 Unspecified macular degeneration: Secondary | ICD-10-CM | POA: Diagnosis not present

## 2015-03-10 DIAGNOSIS — Z9849 Cataract extraction status, unspecified eye: Secondary | ICD-10-CM | POA: Diagnosis not present

## 2015-03-10 DIAGNOSIS — H02402 Unspecified ptosis of left eyelid: Secondary | ICD-10-CM | POA: Diagnosis not present

## 2015-03-10 DIAGNOSIS — Z961 Presence of intraocular lens: Secondary | ICD-10-CM | POA: Diagnosis not present

## 2015-03-10 DIAGNOSIS — H3122 Choroidal dystrophy (central areolar) (generalized) (peripapillary): Secondary | ICD-10-CM | POA: Diagnosis not present

## 2015-03-11 DIAGNOSIS — I10 Essential (primary) hypertension: Secondary | ICD-10-CM | POA: Diagnosis not present

## 2015-03-11 DIAGNOSIS — R748 Abnormal levels of other serum enzymes: Secondary | ICD-10-CM | POA: Diagnosis not present

## 2015-03-11 DIAGNOSIS — F332 Major depressive disorder, recurrent severe without psychotic features: Secondary | ICD-10-CM | POA: Diagnosis not present

## 2015-03-11 DIAGNOSIS — K7290 Hepatic failure, unspecified without coma: Secondary | ICD-10-CM | POA: Diagnosis not present

## 2015-03-17 DIAGNOSIS — F332 Major depressive disorder, recurrent severe without psychotic features: Secondary | ICD-10-CM | POA: Diagnosis not present

## 2015-03-19 DIAGNOSIS — F332 Major depressive disorder, recurrent severe without psychotic features: Secondary | ICD-10-CM | POA: Diagnosis not present

## 2015-03-22 DIAGNOSIS — M069 Rheumatoid arthritis, unspecified: Secondary | ICD-10-CM | POA: Diagnosis not present

## 2015-03-22 DIAGNOSIS — M00849 Arthritis due to other bacteria, unspecified hand: Secondary | ICD-10-CM | POA: Diagnosis not present

## 2015-03-24 DIAGNOSIS — F332 Major depressive disorder, recurrent severe without psychotic features: Secondary | ICD-10-CM | POA: Diagnosis not present

## 2015-03-26 DIAGNOSIS — F332 Major depressive disorder, recurrent severe without psychotic features: Secondary | ICD-10-CM | POA: Diagnosis not present

## 2015-03-31 DIAGNOSIS — R260 Ataxic gait: Secondary | ICD-10-CM | POA: Diagnosis not present

## 2015-03-31 DIAGNOSIS — R278 Other lack of coordination: Secondary | ICD-10-CM | POA: Diagnosis not present

## 2015-04-01 DIAGNOSIS — R260 Ataxic gait: Secondary | ICD-10-CM | POA: Diagnosis not present

## 2015-04-01 DIAGNOSIS — R278 Other lack of coordination: Secondary | ICD-10-CM | POA: Diagnosis not present

## 2015-04-02 DIAGNOSIS — F332 Major depressive disorder, recurrent severe without psychotic features: Secondary | ICD-10-CM | POA: Diagnosis not present

## 2015-04-02 DIAGNOSIS — R278 Other lack of coordination: Secondary | ICD-10-CM | POA: Diagnosis not present

## 2015-04-02 DIAGNOSIS — R260 Ataxic gait: Secondary | ICD-10-CM | POA: Diagnosis not present

## 2015-04-03 DIAGNOSIS — R260 Ataxic gait: Secondary | ICD-10-CM | POA: Diagnosis not present

## 2015-04-03 DIAGNOSIS — R278 Other lack of coordination: Secondary | ICD-10-CM | POA: Diagnosis not present

## 2015-04-04 DIAGNOSIS — F332 Major depressive disorder, recurrent severe without psychotic features: Secondary | ICD-10-CM | POA: Diagnosis not present

## 2015-04-05 DIAGNOSIS — R278 Other lack of coordination: Secondary | ICD-10-CM | POA: Diagnosis not present

## 2015-04-05 DIAGNOSIS — R260 Ataxic gait: Secondary | ICD-10-CM | POA: Diagnosis not present

## 2015-04-06 DIAGNOSIS — R278 Other lack of coordination: Secondary | ICD-10-CM | POA: Diagnosis not present

## 2015-04-06 DIAGNOSIS — R260 Ataxic gait: Secondary | ICD-10-CM | POA: Diagnosis not present

## 2015-04-07 DIAGNOSIS — M79675 Pain in left toe(s): Secondary | ICD-10-CM | POA: Diagnosis not present

## 2015-04-07 DIAGNOSIS — R260 Ataxic gait: Secondary | ICD-10-CM | POA: Diagnosis not present

## 2015-04-07 DIAGNOSIS — R278 Other lack of coordination: Secondary | ICD-10-CM | POA: Diagnosis not present

## 2015-04-07 DIAGNOSIS — B351 Tinea unguium: Secondary | ICD-10-CM | POA: Diagnosis not present

## 2015-04-08 DIAGNOSIS — R260 Ataxic gait: Secondary | ICD-10-CM | POA: Diagnosis not present

## 2015-04-08 DIAGNOSIS — R278 Other lack of coordination: Secondary | ICD-10-CM | POA: Diagnosis not present

## 2015-04-09 DIAGNOSIS — R260 Ataxic gait: Secondary | ICD-10-CM | POA: Diagnosis not present

## 2015-04-09 DIAGNOSIS — R278 Other lack of coordination: Secondary | ICD-10-CM | POA: Diagnosis not present

## 2015-04-10 DIAGNOSIS — F332 Major depressive disorder, recurrent severe without psychotic features: Secondary | ICD-10-CM | POA: Diagnosis not present

## 2015-04-13 DIAGNOSIS — R278 Other lack of coordination: Secondary | ICD-10-CM | POA: Diagnosis not present

## 2015-04-13 DIAGNOSIS — R260 Ataxic gait: Secondary | ICD-10-CM | POA: Diagnosis not present

## 2015-04-14 DIAGNOSIS — R278 Other lack of coordination: Secondary | ICD-10-CM | POA: Diagnosis not present

## 2015-04-14 DIAGNOSIS — R260 Ataxic gait: Secondary | ICD-10-CM | POA: Diagnosis not present

## 2015-04-15 DIAGNOSIS — R278 Other lack of coordination: Secondary | ICD-10-CM | POA: Diagnosis not present

## 2015-04-15 DIAGNOSIS — R260 Ataxic gait: Secondary | ICD-10-CM | POA: Diagnosis not present

## 2015-04-16 DIAGNOSIS — Z23 Encounter for immunization: Secondary | ICD-10-CM | POA: Diagnosis not present

## 2015-04-16 DIAGNOSIS — R278 Other lack of coordination: Secondary | ICD-10-CM | POA: Diagnosis not present

## 2015-04-16 DIAGNOSIS — R260 Ataxic gait: Secondary | ICD-10-CM | POA: Diagnosis not present

## 2015-04-17 DIAGNOSIS — R260 Ataxic gait: Secondary | ICD-10-CM | POA: Diagnosis not present

## 2015-04-17 DIAGNOSIS — R278 Other lack of coordination: Secondary | ICD-10-CM | POA: Diagnosis not present

## 2015-04-21 DIAGNOSIS — F332 Major depressive disorder, recurrent severe without psychotic features: Secondary | ICD-10-CM | POA: Diagnosis not present

## 2015-04-23 DIAGNOSIS — F332 Major depressive disorder, recurrent severe without psychotic features: Secondary | ICD-10-CM | POA: Diagnosis not present

## 2015-04-28 DIAGNOSIS — F332 Major depressive disorder, recurrent severe without psychotic features: Secondary | ICD-10-CM | POA: Diagnosis not present

## 2015-04-30 DIAGNOSIS — F332 Major depressive disorder, recurrent severe without psychotic features: Secondary | ICD-10-CM | POA: Diagnosis not present

## 2015-05-05 DIAGNOSIS — F332 Major depressive disorder, recurrent severe without psychotic features: Secondary | ICD-10-CM | POA: Diagnosis not present

## 2015-05-07 DIAGNOSIS — F332 Major depressive disorder, recurrent severe without psychotic features: Secondary | ICD-10-CM | POA: Diagnosis not present

## 2015-05-12 DIAGNOSIS — F332 Major depressive disorder, recurrent severe without psychotic features: Secondary | ICD-10-CM | POA: Diagnosis not present

## 2015-05-14 DIAGNOSIS — F332 Major depressive disorder, recurrent severe without psychotic features: Secondary | ICD-10-CM | POA: Diagnosis not present

## 2015-05-17 DIAGNOSIS — F332 Major depressive disorder, recurrent severe without psychotic features: Secondary | ICD-10-CM | POA: Diagnosis not present

## 2015-05-17 DIAGNOSIS — M00849 Arthritis due to other bacteria, unspecified hand: Secondary | ICD-10-CM | POA: Diagnosis not present

## 2015-05-17 DIAGNOSIS — M069 Rheumatoid arthritis, unspecified: Secondary | ICD-10-CM | POA: Diagnosis not present

## 2015-05-26 DIAGNOSIS — F332 Major depressive disorder, recurrent severe without psychotic features: Secondary | ICD-10-CM | POA: Diagnosis not present

## 2015-05-29 DIAGNOSIS — F332 Major depressive disorder, recurrent severe without psychotic features: Secondary | ICD-10-CM | POA: Diagnosis not present

## 2015-06-02 DIAGNOSIS — F332 Major depressive disorder, recurrent severe without psychotic features: Secondary | ICD-10-CM | POA: Diagnosis not present

## 2015-06-04 DIAGNOSIS — F332 Major depressive disorder, recurrent severe without psychotic features: Secondary | ICD-10-CM | POA: Diagnosis not present

## 2015-06-09 DIAGNOSIS — F332 Major depressive disorder, recurrent severe without psychotic features: Secondary | ICD-10-CM | POA: Diagnosis not present

## 2015-06-13 DIAGNOSIS — F332 Major depressive disorder, recurrent severe without psychotic features: Secondary | ICD-10-CM | POA: Diagnosis not present

## 2015-06-17 DIAGNOSIS — F332 Major depressive disorder, recurrent severe without psychotic features: Secondary | ICD-10-CM | POA: Diagnosis not present

## 2015-06-19 DIAGNOSIS — G4700 Insomnia, unspecified: Secondary | ICD-10-CM | POA: Diagnosis not present

## 2015-06-19 DIAGNOSIS — F411 Generalized anxiety disorder: Secondary | ICD-10-CM | POA: Diagnosis not present

## 2015-06-19 DIAGNOSIS — F329 Major depressive disorder, single episode, unspecified: Secondary | ICD-10-CM | POA: Diagnosis not present

## 2015-06-20 DIAGNOSIS — F332 Major depressive disorder, recurrent severe without psychotic features: Secondary | ICD-10-CM | POA: Diagnosis not present

## 2015-06-23 ENCOUNTER — Encounter: Payer: Self-pay | Admitting: Cardiology

## 2015-06-23 ENCOUNTER — Ambulatory Visit (INDEPENDENT_AMBULATORY_CARE_PROVIDER_SITE_OTHER): Payer: Medicare Other | Admitting: Cardiology

## 2015-06-23 VITALS — BP 121/62 | HR 62 | Ht 59.0 in | Wt 113.0 lb

## 2015-06-23 DIAGNOSIS — E785 Hyperlipidemia, unspecified: Secondary | ICD-10-CM | POA: Diagnosis not present

## 2015-06-23 DIAGNOSIS — F332 Major depressive disorder, recurrent severe without psychotic features: Secondary | ICD-10-CM | POA: Diagnosis not present

## 2015-06-23 DIAGNOSIS — I2510 Atherosclerotic heart disease of native coronary artery without angina pectoris: Secondary | ICD-10-CM | POA: Diagnosis not present

## 2015-06-23 DIAGNOSIS — I10 Essential (primary) hypertension: Secondary | ICD-10-CM

## 2015-06-23 NOTE — Progress Notes (Signed)
Patient ID: Candice Meza, female   DOB: [birth year], 81 y.o.   MRN: [id number]     Clinical Summary Candice Meza is a 81 y.o.female seen today for follow up of the following medical problems.  1. CAD  - prior MI in 1997, non-obstructive disease by cath at that time.  - echo 2008 LVEF 60%, mild AI  - history of ASA allergy, has not been taking  - denies any chest pain. Stable SOB with walking, though limited also due to chronic leg pain. - compliant with meds  2. HTN  - compliant with meds   3. Hyperlipidemia  - compliant statin - last lipid panel 09/2014 TC 126 TG 96 HDL 44 LDL 63    Past Medical History  Diagnosis Date  . MI, acute, non ST segment elevation (HCC) 1997    Questionable lesion in the circumflex at catheterization; normal EF; chronic dyspnea without specific cause identified  . Hyperlipidemia   . Hypertension   . Postmenopausal     Estrogen replacement therapy  . Cough     Prolonged, possible asthmatic bronchitis  . Benign essential tremor   . Osteopenia     Versus osteoporosis  . Macular degeneration   . Degenerative joint disease     versus rheumatoid arthritis  . Repeated falls      Allergies  Allergen Reactions  . Aspirin     GI symptoms  . Bee Venom   . Morphine And Related   . Sulfa Antibiotics     dyspnea  . Tetracyclines & Related   . Codeine Rash  . Pamelor Rash  . Penicillins Rash     Current Outpatient Prescriptions  Medication Sig Dispense Refill  . acetaminophen (TYLENOL) 500 MG tablet Take 500 mg by mouth every 6 (six) hours as needed.    [name] albuterol (ACCUNEB) 1.25 MG/3ML nebulizer solution Take 1 ampule by nebulization every 4 (four) hours as needed for wheezing.    [name] ALPRAZolam (XANAX) 0.25 MG tablet Take 0.25 mg by mouth 2 (two) times daily. For anxiety    . alum & mag hydroxide-simeth (MYLANTA) 200-200-20 MG/5ML suspension Take 15 mLs by mouth at bedtime.    . calcium-vitamin D (OSCAL WITH D) 500-200 MG-UNIT  tablet Take 1 tablet by mouth daily.    [name] dextromethorphan (DELSYM) 30 MG/5ML liquid Take 60 mg by mouth 2 (two) times daily as needed. For  cough    . fluticasone (FLONASE) 50 MCG/ACT nasal spray Place 2 sprays into the nose daily.      . furosemide (LASIX) 40 MG tablet Take 40 mg by mouth daily.     [name] guaifenesin (HUMIBID E) 400 MG TABS tablet Take 600 mg by mouth 2 (two) times daily.     [name] guar gum powder Take 12 g by mouth 2 (two) times daily. In 8 oz of water    . KLOR-CON M20 20 MEQ tablet Take 1 tablet by mouth daily.    [name] loratadine (CLARITIN) 10 MG tablet Take 10 mg by mouth daily.    [name] losartan (COZAAR) 25 MG tablet Take 1 tablet by mouth daily.    . Multiple Vitamin (MULTIVITAMIN) tablet Take 1 tablet by mouth daily.      . nabumetone (RELAFEN) 500 MG tablet Take 500 mg by mouth daily.     . nitroGLYCERIN (NITROSTAT) 0.4 MG SL tablet Place 0.4 mg under the tongue as directed.      [name] omeprazole (PRILOSEC) 20 MG capsule Take 1  capsule by mouth daily.    Bertram Gala Glycol-Propyl Glycol (SYSTANE) 0.4-0.3 % SOLN Apply 1 drop to eye 3 (three) times daily.      . polyethylene glycol powder (GLYCOLAX/MIRALAX) powder Take 17 g by mouth daily.    . pravastatin (PRAVACHOL) 40 MG tablet Take 40 mg by mouth at bedtime.      . promethazine (PHENERGAN) 25 MG tablet Take 12.5 mg by mouth every 4 (four) hours as needed. For nausea    . SPIRIVA HANDIHALER 18 MCG inhalation capsule Place 1 puff into inhaler and inhale at bedtime.    . SYMBICORT 160-4.5 MCG/ACT inhaler Inhale 2 Inhalers into the lungs 2 (two) times daily.    . traMADol (ULTRAM) 50 MG tablet Take 50 mg by mouth every 6 (six) hours as needed. For pain    . venlafaxine XR (EFFEXOR-XR) 150 MG 24 hr capsule Take 1 capsule by mouth daily.    [name] venlafaxine XR (EFFEXOR-XR) 37.5 MG 24 hr capsule Take 1 capsule by mouth daily.     No current facility-administered medications for this visit.     Past Surgical History  Procedure Laterality  Date  . Bladder suspension    . Inguinal hernia repair      Left  . Foot surgery      Bilateral  . Ercp  04/18/2012    Procedure: ENDOSCOPIC RETROGRADE CHOLANGIOPANCREATOGRAPHY (ERCP);  Surgeon: Malissa Hippo, MD;  Location: [facility];  Service: Endoscopy;  Laterality: N/A;  Diagnostic     Allergies  Allergen Reactions  . Aspirin     GI symptoms  . Bee Venom   . Morphine And Related   . Sulfa Antibiotics     dyspnea  . Tetracyclines & Related   . Codeine Rash  . Pamelor Rash  . Penicillins Rash      Family History  Problem Relation Age of Onset  . Coronary artery disease    . Stroke       Social History Candice Meza reports that she has never smoked. She has never used smokeless tobacco. Candice Meza reports that she does not drink alcohol.   Review of Systems CONSTITUTIONAL: No weight loss, fever, chills, weakness or fatigue.  HEENT: Eyes: No visual loss, blurred vision, double vision or yellow sclerae.No hearing loss, sneezing, congestion, runny nose or sore throat.  SKIN: No rash or itching.  CARDIOVASCULAR: per HPI RESPIRATORY: No shortness of breath, cough or sputum.  GASTROINTESTINAL: No anorexia, nausea, vomiting or diarrhea. No abdominal pain or blood.  GENITOURINARY: No burning on urination, no polyuria NEUROLOGICAL: No headache, dizziness, syncope, paralysis, ataxia, numbness or tingling in the extremities. No change in bowel or bladder control.  MUSCULOSKELETAL: No muscle, back pain, joint pain or stiffness.  LYMPHATICS: No enlarged nodes. No history of splenectomy.  PSYCHIATRIC: No history of depression or anxiety.  ENDOCRINOLOGIC: No reports of sweating, cold or heat intolerance. No polyuria or polydipsia.  [name]   Physical Examination Filed Vitals:   06/23/15 0808  BP: 121/62  Pulse: 62   Filed Weights   06/23/15 0808  Weight: 113 lb (51.256 kg)    Gen: resting comfortably, no acute distress HEENT: no scleral icterus, pupils equal round  and reactive, no palptable cervical adenopathy,  CV: RRR, no m/r/g, no jvd Resp: Clear to auscultation bilaterally GI: abdomen is soft, non-tender, non-distended, normal bowel sounds, no hepatosplenomegaly MSK: extremities are warm, no edema.  Skin: warm, no rash Neuro:  no focal deficits Psych: appropriate affect  Diagnostic Studies  10/29/13 Echo Study Conclusions  - Left ventricle: The cavity size was normal. Wall thickness was increased in a pattern of mild LVH. Systolic function was normal. The estimated ejection fraction was in the range of 55% to 60%. Wall motion was normal; there were no regional wall motion abnormalities. There was an increased relative contribution of atrial contraction to ventricular filling. Doppler parameters are consistent with abnormal left ventricular relaxation (grade 1 diastolic dysfunction). - Aortic valve: Mildly calcified annulus. Trileaflet; mildly thickened, mildly calcified leaflets. Moderate, centralregurgitation. Minimal aortic stenosis. Peak velocity: 205cm/s (S). Mean gradient: 54mm Hg (S). Valve area: 1.85cm^2(VTI). - Mitral valve: Calcified annulus. Mildly thickened leaflets . Mild regurgitation. - Left atrium: The atrium was mildly dilated. - Tricuspid valve: Mildly thickened leaflets. Mild-moderate regurgitation. - Pulmonary arteries: PA peak pressure: 12mm Hg (S). Mild to moderately elevated pulmonary pressures.    Assessment and Plan  1. CAD  - no current symptoms - continue secondary prevention and risk factor modification.   2. HTN  - at goal, continue current meds    3. Hyperlipidemia  - at goal - continue current statin  F/u 1 year     Antoine Poche, M.D

## 2015-06-23 NOTE — Patient Instructions (Signed)

## 2015-06-25 DIAGNOSIS — F332 Major depressive disorder, recurrent severe without psychotic features: Secondary | ICD-10-CM | POA: Diagnosis not present

## 2015-06-30 DIAGNOSIS — F332 Major depressive disorder, recurrent severe without psychotic features: Secondary | ICD-10-CM | POA: Diagnosis not present

## 2015-07-02 DIAGNOSIS — Z79899 Other long term (current) drug therapy: Secondary | ICD-10-CM | POA: Diagnosis not present

## 2015-07-02 DIAGNOSIS — K449 Diaphragmatic hernia without obstruction or gangrene: Secondary | ICD-10-CM | POA: Diagnosis not present

## 2015-07-02 DIAGNOSIS — I10 Essential (primary) hypertension: Secondary | ICD-10-CM | POA: Diagnosis not present

## 2015-07-02 DIAGNOSIS — K219 Gastro-esophageal reflux disease without esophagitis: Secondary | ICD-10-CM | POA: Diagnosis not present

## 2015-07-02 DIAGNOSIS — M25552 Pain in left hip: Secondary | ICD-10-CM | POA: Diagnosis not present

## 2015-07-02 DIAGNOSIS — R51 Headache: Secondary | ICD-10-CM | POA: Diagnosis not present

## 2015-07-02 DIAGNOSIS — S79912A Unspecified injury of left hip, initial encounter: Secondary | ICD-10-CM | POA: Diagnosis not present

## 2015-07-02 DIAGNOSIS — T148 Other injury of unspecified body region: Secondary | ICD-10-CM | POA: Diagnosis not present

## 2015-07-02 DIAGNOSIS — R531 Weakness: Secondary | ICD-10-CM | POA: Diagnosis not present

## 2015-07-02 DIAGNOSIS — R42 Dizziness and giddiness: Secondary | ICD-10-CM | POA: Diagnosis not present

## 2015-07-02 DIAGNOSIS — W1839XA Other fall on same level, initial encounter: Secondary | ICD-10-CM | POA: Diagnosis not present

## 2015-07-02 DIAGNOSIS — M542 Cervicalgia: Secondary | ICD-10-CM | POA: Diagnosis not present

## 2015-07-02 DIAGNOSIS — M160 Bilateral primary osteoarthritis of hip: Secondary | ICD-10-CM | POA: Diagnosis not present

## 2015-07-02 DIAGNOSIS — R251 Tremor, unspecified: Secondary | ICD-10-CM | POA: Diagnosis not present

## 2015-07-02 DIAGNOSIS — M47812 Spondylosis without myelopathy or radiculopathy, cervical region: Secondary | ICD-10-CM | POA: Diagnosis not present

## 2015-07-02 DIAGNOSIS — J449 Chronic obstructive pulmonary disease, unspecified: Secondary | ICD-10-CM | POA: Diagnosis not present

## 2015-07-02 DIAGNOSIS — Z743 Need for continuous supervision: Secondary | ICD-10-CM | POA: Diagnosis not present

## 2015-07-02 DIAGNOSIS — S0990XA Unspecified injury of head, initial encounter: Secondary | ICD-10-CM | POA: Diagnosis not present

## 2015-07-02 DIAGNOSIS — M5030 Other cervical disc degeneration, unspecified cervical region: Secondary | ICD-10-CM | POA: Diagnosis not present

## 2015-07-02 DIAGNOSIS — S79911A Unspecified injury of right hip, initial encounter: Secondary | ICD-10-CM | POA: Diagnosis not present

## 2015-07-02 DIAGNOSIS — I482 Chronic atrial fibrillation: Secondary | ICD-10-CM | POA: Diagnosis not present

## 2015-07-02 DIAGNOSIS — F329 Major depressive disorder, single episode, unspecified: Secondary | ICD-10-CM | POA: Diagnosis not present

## 2015-07-02 DIAGNOSIS — M791 Myalgia: Secondary | ICD-10-CM | POA: Diagnosis not present

## 2015-07-02 DIAGNOSIS — S199XXA Unspecified injury of neck, initial encounter: Secondary | ICD-10-CM | POA: Diagnosis not present

## 2015-07-02 DIAGNOSIS — M069 Rheumatoid arthritis, unspecified: Secondary | ICD-10-CM | POA: Diagnosis not present

## 2015-07-02 DIAGNOSIS — I252 Old myocardial infarction: Secondary | ICD-10-CM | POA: Diagnosis not present

## 2015-07-02 DIAGNOSIS — I509 Heart failure, unspecified: Secondary | ICD-10-CM | POA: Diagnosis not present

## 2015-07-02 DIAGNOSIS — R05 Cough: Secondary | ICD-10-CM | POA: Diagnosis not present

## 2015-07-02 DIAGNOSIS — Z981 Arthrodesis status: Secondary | ICD-10-CM | POA: Diagnosis not present

## 2015-07-02 DIAGNOSIS — M25551 Pain in right hip: Secondary | ICD-10-CM | POA: Diagnosis not present

## 2015-07-04 DIAGNOSIS — F332 Major depressive disorder, recurrent severe without psychotic features: Secondary | ICD-10-CM | POA: Diagnosis not present

## 2015-07-06 DIAGNOSIS — F332 Major depressive disorder, recurrent severe without psychotic features: Secondary | ICD-10-CM | POA: Diagnosis not present

## 2015-07-07 DIAGNOSIS — R531 Weakness: Secondary | ICD-10-CM | POA: Diagnosis not present

## 2015-07-07 DIAGNOSIS — R278 Other lack of coordination: Secondary | ICD-10-CM | POA: Diagnosis not present

## 2015-07-07 DIAGNOSIS — W010XXD Fall on same level from slipping, tripping and stumbling without subsequent striking against object, subsequent encounter: Secondary | ICD-10-CM | POA: Diagnosis not present

## 2015-07-07 DIAGNOSIS — I4891 Unspecified atrial fibrillation: Secondary | ICD-10-CM | POA: Diagnosis not present

## 2015-07-07 DIAGNOSIS — R260 Ataxic gait: Secondary | ICD-10-CM | POA: Diagnosis not present

## 2015-07-08 DIAGNOSIS — W010XXD Fall on same level from slipping, tripping and stumbling without subsequent striking against object, subsequent encounter: Secondary | ICD-10-CM | POA: Diagnosis not present

## 2015-07-08 DIAGNOSIS — R260 Ataxic gait: Secondary | ICD-10-CM | POA: Diagnosis not present

## 2015-07-08 DIAGNOSIS — R278 Other lack of coordination: Secondary | ICD-10-CM | POA: Diagnosis not present

## 2015-07-08 DIAGNOSIS — R531 Weakness: Secondary | ICD-10-CM | POA: Diagnosis not present

## 2015-07-08 DIAGNOSIS — I4891 Unspecified atrial fibrillation: Secondary | ICD-10-CM | POA: Diagnosis not present

## 2015-07-09 DIAGNOSIS — R260 Ataxic gait: Secondary | ICD-10-CM | POA: Diagnosis not present

## 2015-07-09 DIAGNOSIS — I4891 Unspecified atrial fibrillation: Secondary | ICD-10-CM | POA: Diagnosis not present

## 2015-07-09 DIAGNOSIS — R531 Weakness: Secondary | ICD-10-CM | POA: Diagnosis not present

## 2015-07-09 DIAGNOSIS — R278 Other lack of coordination: Secondary | ICD-10-CM | POA: Diagnosis not present

## 2015-07-09 DIAGNOSIS — W010XXD Fall on same level from slipping, tripping and stumbling without subsequent striking against object, subsequent encounter: Secondary | ICD-10-CM | POA: Diagnosis not present

## 2015-07-09 DIAGNOSIS — F332 Major depressive disorder, recurrent severe without psychotic features: Secondary | ICD-10-CM | POA: Diagnosis not present

## 2015-07-10 DIAGNOSIS — W010XXD Fall on same level from slipping, tripping and stumbling without subsequent striking against object, subsequent encounter: Secondary | ICD-10-CM | POA: Diagnosis not present

## 2015-07-10 DIAGNOSIS — R278 Other lack of coordination: Secondary | ICD-10-CM | POA: Diagnosis not present

## 2015-07-10 DIAGNOSIS — I4891 Unspecified atrial fibrillation: Secondary | ICD-10-CM | POA: Diagnosis not present

## 2015-07-10 DIAGNOSIS — R531 Weakness: Secondary | ICD-10-CM | POA: Diagnosis not present

## 2015-07-10 DIAGNOSIS — R260 Ataxic gait: Secondary | ICD-10-CM | POA: Diagnosis not present

## 2015-07-12 DIAGNOSIS — M069 Rheumatoid arthritis, unspecified: Secondary | ICD-10-CM | POA: Diagnosis not present

## 2015-07-13 DIAGNOSIS — R260 Ataxic gait: Secondary | ICD-10-CM | POA: Diagnosis not present

## 2015-07-13 DIAGNOSIS — I4891 Unspecified atrial fibrillation: Secondary | ICD-10-CM | POA: Diagnosis not present

## 2015-07-13 DIAGNOSIS — R278 Other lack of coordination: Secondary | ICD-10-CM | POA: Diagnosis not present

## 2015-07-13 DIAGNOSIS — R531 Weakness: Secondary | ICD-10-CM | POA: Diagnosis not present

## 2015-07-13 DIAGNOSIS — W010XXD Fall on same level from slipping, tripping and stumbling without subsequent striking against object, subsequent encounter: Secondary | ICD-10-CM | POA: Diagnosis not present

## 2015-07-14 DIAGNOSIS — I4891 Unspecified atrial fibrillation: Secondary | ICD-10-CM | POA: Diagnosis not present

## 2015-07-14 DIAGNOSIS — R278 Other lack of coordination: Secondary | ICD-10-CM | POA: Diagnosis not present

## 2015-07-14 DIAGNOSIS — R531 Weakness: Secondary | ICD-10-CM | POA: Diagnosis not present

## 2015-07-14 DIAGNOSIS — R260 Ataxic gait: Secondary | ICD-10-CM | POA: Diagnosis not present

## 2015-07-14 DIAGNOSIS — W010XXD Fall on same level from slipping, tripping and stumbling without subsequent striking against object, subsequent encounter: Secondary | ICD-10-CM | POA: Diagnosis not present

## 2015-07-14 DIAGNOSIS — F332 Major depressive disorder, recurrent severe without psychotic features: Secondary | ICD-10-CM | POA: Diagnosis not present

## 2015-07-15 DIAGNOSIS — R278 Other lack of coordination: Secondary | ICD-10-CM | POA: Diagnosis not present

## 2015-07-15 DIAGNOSIS — R531 Weakness: Secondary | ICD-10-CM | POA: Diagnosis not present

## 2015-07-15 DIAGNOSIS — I4891 Unspecified atrial fibrillation: Secondary | ICD-10-CM | POA: Diagnosis not present

## 2015-07-15 DIAGNOSIS — R260 Ataxic gait: Secondary | ICD-10-CM | POA: Diagnosis not present

## 2015-07-15 DIAGNOSIS — W010XXD Fall on same level from slipping, tripping and stumbling without subsequent striking against object, subsequent encounter: Secondary | ICD-10-CM | POA: Diagnosis not present

## 2015-07-16 DIAGNOSIS — F332 Major depressive disorder, recurrent severe without psychotic features: Secondary | ICD-10-CM | POA: Diagnosis not present

## 2015-07-17 DIAGNOSIS — I4891 Unspecified atrial fibrillation: Secondary | ICD-10-CM | POA: Diagnosis not present

## 2015-07-17 DIAGNOSIS — R278 Other lack of coordination: Secondary | ICD-10-CM | POA: Diagnosis not present

## 2015-07-17 DIAGNOSIS — W010XXD Fall on same level from slipping, tripping and stumbling without subsequent striking against object, subsequent encounter: Secondary | ICD-10-CM | POA: Diagnosis not present

## 2015-07-17 DIAGNOSIS — R531 Weakness: Secondary | ICD-10-CM | POA: Diagnosis not present

## 2015-07-17 DIAGNOSIS — R260 Ataxic gait: Secondary | ICD-10-CM | POA: Diagnosis not present

## 2015-07-18 DIAGNOSIS — R278 Other lack of coordination: Secondary | ICD-10-CM | POA: Diagnosis not present

## 2015-07-18 DIAGNOSIS — W010XXD Fall on same level from slipping, tripping and stumbling without subsequent striking against object, subsequent encounter: Secondary | ICD-10-CM | POA: Diagnosis not present

## 2015-07-18 DIAGNOSIS — I4891 Unspecified atrial fibrillation: Secondary | ICD-10-CM | POA: Diagnosis not present

## 2015-07-18 DIAGNOSIS — R260 Ataxic gait: Secondary | ICD-10-CM | POA: Diagnosis not present

## 2015-07-18 DIAGNOSIS — R531 Weakness: Secondary | ICD-10-CM | POA: Diagnosis not present

## 2015-07-20 DIAGNOSIS — W010XXD Fall on same level from slipping, tripping and stumbling without subsequent striking against object, subsequent encounter: Secondary | ICD-10-CM | POA: Diagnosis not present

## 2015-07-20 DIAGNOSIS — R278 Other lack of coordination: Secondary | ICD-10-CM | POA: Diagnosis not present

## 2015-07-20 DIAGNOSIS — I4891 Unspecified atrial fibrillation: Secondary | ICD-10-CM | POA: Diagnosis not present

## 2015-07-20 DIAGNOSIS — R531 Weakness: Secondary | ICD-10-CM | POA: Diagnosis not present

## 2015-07-20 DIAGNOSIS — R260 Ataxic gait: Secondary | ICD-10-CM | POA: Diagnosis not present

## 2015-07-21 DIAGNOSIS — F332 Major depressive disorder, recurrent severe without psychotic features: Secondary | ICD-10-CM | POA: Diagnosis not present

## 2015-07-21 DIAGNOSIS — W010XXD Fall on same level from slipping, tripping and stumbling without subsequent striking against object, subsequent encounter: Secondary | ICD-10-CM | POA: Diagnosis not present

## 2015-07-21 DIAGNOSIS — R278 Other lack of coordination: Secondary | ICD-10-CM | POA: Diagnosis not present

## 2015-07-21 DIAGNOSIS — I4891 Unspecified atrial fibrillation: Secondary | ICD-10-CM | POA: Diagnosis not present

## 2015-07-21 DIAGNOSIS — R531 Weakness: Secondary | ICD-10-CM | POA: Diagnosis not present

## 2015-07-21 DIAGNOSIS — R260 Ataxic gait: Secondary | ICD-10-CM | POA: Diagnosis not present

## 2015-07-22 DIAGNOSIS — I4891 Unspecified atrial fibrillation: Secondary | ICD-10-CM | POA: Diagnosis not present

## 2015-07-22 DIAGNOSIS — R260 Ataxic gait: Secondary | ICD-10-CM | POA: Diagnosis not present

## 2015-07-22 DIAGNOSIS — R278 Other lack of coordination: Secondary | ICD-10-CM | POA: Diagnosis not present

## 2015-07-22 DIAGNOSIS — W010XXD Fall on same level from slipping, tripping and stumbling without subsequent striking against object, subsequent encounter: Secondary | ICD-10-CM | POA: Diagnosis not present

## 2015-07-22 DIAGNOSIS — R531 Weakness: Secondary | ICD-10-CM | POA: Diagnosis not present

## 2015-07-23 DIAGNOSIS — F332 Major depressive disorder, recurrent severe without psychotic features: Secondary | ICD-10-CM | POA: Diagnosis not present

## 2015-07-23 DIAGNOSIS — W010XXD Fall on same level from slipping, tripping and stumbling without subsequent striking against object, subsequent encounter: Secondary | ICD-10-CM | POA: Diagnosis not present

## 2015-07-23 DIAGNOSIS — R278 Other lack of coordination: Secondary | ICD-10-CM | POA: Diagnosis not present

## 2015-07-23 DIAGNOSIS — I4891 Unspecified atrial fibrillation: Secondary | ICD-10-CM | POA: Diagnosis not present

## 2015-07-23 DIAGNOSIS — R531 Weakness: Secondary | ICD-10-CM | POA: Diagnosis not present

## 2015-07-23 DIAGNOSIS — R260 Ataxic gait: Secondary | ICD-10-CM | POA: Diagnosis not present

## 2015-07-24 DIAGNOSIS — W010XXD Fall on same level from slipping, tripping and stumbling without subsequent striking against object, subsequent encounter: Secondary | ICD-10-CM | POA: Diagnosis not present

## 2015-07-24 DIAGNOSIS — R531 Weakness: Secondary | ICD-10-CM | POA: Diagnosis not present

## 2015-07-24 DIAGNOSIS — I4891 Unspecified atrial fibrillation: Secondary | ICD-10-CM | POA: Diagnosis not present

## 2015-07-24 DIAGNOSIS — R278 Other lack of coordination: Secondary | ICD-10-CM | POA: Diagnosis not present

## 2015-07-24 DIAGNOSIS — R260 Ataxic gait: Secondary | ICD-10-CM | POA: Diagnosis not present

## 2015-07-27 DIAGNOSIS — W010XXD Fall on same level from slipping, tripping and stumbling without subsequent striking against object, subsequent encounter: Secondary | ICD-10-CM | POA: Diagnosis not present

## 2015-07-27 DIAGNOSIS — R260 Ataxic gait: Secondary | ICD-10-CM | POA: Diagnosis not present

## 2015-07-27 DIAGNOSIS — I4891 Unspecified atrial fibrillation: Secondary | ICD-10-CM | POA: Diagnosis not present

## 2015-07-27 DIAGNOSIS — R531 Weakness: Secondary | ICD-10-CM | POA: Diagnosis not present

## 2015-07-27 DIAGNOSIS — R278 Other lack of coordination: Secondary | ICD-10-CM | POA: Diagnosis not present

## 2015-07-28 DIAGNOSIS — R278 Other lack of coordination: Secondary | ICD-10-CM | POA: Diagnosis not present

## 2015-07-28 DIAGNOSIS — R531 Weakness: Secondary | ICD-10-CM | POA: Diagnosis not present

## 2015-07-28 DIAGNOSIS — R260 Ataxic gait: Secondary | ICD-10-CM | POA: Diagnosis not present

## 2015-07-28 DIAGNOSIS — I4891 Unspecified atrial fibrillation: Secondary | ICD-10-CM | POA: Diagnosis not present

## 2015-07-28 DIAGNOSIS — W010XXD Fall on same level from slipping, tripping and stumbling without subsequent striking against object, subsequent encounter: Secondary | ICD-10-CM | POA: Diagnosis not present

## 2015-07-28 DIAGNOSIS — F332 Major depressive disorder, recurrent severe without psychotic features: Secondary | ICD-10-CM | POA: Diagnosis not present

## 2015-07-29 DIAGNOSIS — R260 Ataxic gait: Secondary | ICD-10-CM | POA: Diagnosis not present

## 2015-07-29 DIAGNOSIS — W010XXD Fall on same level from slipping, tripping and stumbling without subsequent striking against object, subsequent encounter: Secondary | ICD-10-CM | POA: Diagnosis not present

## 2015-07-29 DIAGNOSIS — I4891 Unspecified atrial fibrillation: Secondary | ICD-10-CM | POA: Diagnosis not present

## 2015-07-29 DIAGNOSIS — R531 Weakness: Secondary | ICD-10-CM | POA: Diagnosis not present

## 2015-07-29 DIAGNOSIS — R278 Other lack of coordination: Secondary | ICD-10-CM | POA: Diagnosis not present

## 2015-07-30 DIAGNOSIS — W010XXD Fall on same level from slipping, tripping and stumbling without subsequent striking against object, subsequent encounter: Secondary | ICD-10-CM | POA: Diagnosis not present

## 2015-07-30 DIAGNOSIS — R531 Weakness: Secondary | ICD-10-CM | POA: Diagnosis not present

## 2015-07-30 DIAGNOSIS — F332 Major depressive disorder, recurrent severe without psychotic features: Secondary | ICD-10-CM | POA: Diagnosis not present

## 2015-07-30 DIAGNOSIS — I4891 Unspecified atrial fibrillation: Secondary | ICD-10-CM | POA: Diagnosis not present

## 2015-07-30 DIAGNOSIS — R260 Ataxic gait: Secondary | ICD-10-CM | POA: Diagnosis not present

## 2015-07-30 DIAGNOSIS — R278 Other lack of coordination: Secondary | ICD-10-CM | POA: Diagnosis not present

## 2015-07-31 DIAGNOSIS — W010XXD Fall on same level from slipping, tripping and stumbling without subsequent striking against object, subsequent encounter: Secondary | ICD-10-CM | POA: Diagnosis not present

## 2015-07-31 DIAGNOSIS — R531 Weakness: Secondary | ICD-10-CM | POA: Diagnosis not present

## 2015-07-31 DIAGNOSIS — I4891 Unspecified atrial fibrillation: Secondary | ICD-10-CM | POA: Diagnosis not present

## 2015-07-31 DIAGNOSIS — R278 Other lack of coordination: Secondary | ICD-10-CM | POA: Diagnosis not present

## 2015-07-31 DIAGNOSIS — R260 Ataxic gait: Secondary | ICD-10-CM | POA: Diagnosis not present

## 2015-08-02 DIAGNOSIS — F332 Major depressive disorder, recurrent severe without psychotic features: Secondary | ICD-10-CM | POA: Diagnosis not present

## 2015-08-03 DIAGNOSIS — W010XXD Fall on same level from slipping, tripping and stumbling without subsequent striking against object, subsequent encounter: Secondary | ICD-10-CM | POA: Diagnosis not present

## 2015-08-03 DIAGNOSIS — R278 Other lack of coordination: Secondary | ICD-10-CM | POA: Diagnosis not present

## 2015-08-03 DIAGNOSIS — R531 Weakness: Secondary | ICD-10-CM | POA: Diagnosis not present

## 2015-08-03 DIAGNOSIS — R260 Ataxic gait: Secondary | ICD-10-CM | POA: Diagnosis not present

## 2015-08-03 DIAGNOSIS — I4891 Unspecified atrial fibrillation: Secondary | ICD-10-CM | POA: Diagnosis not present

## 2015-08-04 DIAGNOSIS — W010XXD Fall on same level from slipping, tripping and stumbling without subsequent striking against object, subsequent encounter: Secondary | ICD-10-CM | POA: Diagnosis not present

## 2015-08-04 DIAGNOSIS — R278 Other lack of coordination: Secondary | ICD-10-CM | POA: Diagnosis not present

## 2015-08-04 DIAGNOSIS — I4891 Unspecified atrial fibrillation: Secondary | ICD-10-CM | POA: Diagnosis not present

## 2015-08-04 DIAGNOSIS — R531 Weakness: Secondary | ICD-10-CM | POA: Diagnosis not present

## 2015-08-04 DIAGNOSIS — R260 Ataxic gait: Secondary | ICD-10-CM | POA: Diagnosis not present

## 2015-08-05 DIAGNOSIS — I4891 Unspecified atrial fibrillation: Secondary | ICD-10-CM | POA: Diagnosis not present

## 2015-08-05 DIAGNOSIS — W010XXD Fall on same level from slipping, tripping and stumbling without subsequent striking against object, subsequent encounter: Secondary | ICD-10-CM | POA: Diagnosis not present

## 2015-08-05 DIAGNOSIS — R531 Weakness: Secondary | ICD-10-CM | POA: Diagnosis not present

## 2015-08-05 DIAGNOSIS — R278 Other lack of coordination: Secondary | ICD-10-CM | POA: Diagnosis not present

## 2015-08-05 DIAGNOSIS — R260 Ataxic gait: Secondary | ICD-10-CM | POA: Diagnosis not present

## 2015-08-06 DIAGNOSIS — W010XXD Fall on same level from slipping, tripping and stumbling without subsequent striking against object, subsequent encounter: Secondary | ICD-10-CM | POA: Diagnosis not present

## 2015-08-06 DIAGNOSIS — R278 Other lack of coordination: Secondary | ICD-10-CM | POA: Diagnosis not present

## 2015-08-06 DIAGNOSIS — R531 Weakness: Secondary | ICD-10-CM | POA: Diagnosis not present

## 2015-08-06 DIAGNOSIS — F332 Major depressive disorder, recurrent severe without psychotic features: Secondary | ICD-10-CM | POA: Diagnosis not present

## 2015-08-06 DIAGNOSIS — R260 Ataxic gait: Secondary | ICD-10-CM | POA: Diagnosis not present

## 2015-08-06 DIAGNOSIS — I4891 Unspecified atrial fibrillation: Secondary | ICD-10-CM | POA: Diagnosis not present

## 2015-08-07 DIAGNOSIS — W010XXD Fall on same level from slipping, tripping and stumbling without subsequent striking against object, subsequent encounter: Secondary | ICD-10-CM | POA: Diagnosis not present

## 2015-08-07 DIAGNOSIS — R531 Weakness: Secondary | ICD-10-CM | POA: Diagnosis not present

## 2015-08-07 DIAGNOSIS — R278 Other lack of coordination: Secondary | ICD-10-CM | POA: Diagnosis not present

## 2015-08-07 DIAGNOSIS — I4891 Unspecified atrial fibrillation: Secondary | ICD-10-CM | POA: Diagnosis not present

## 2015-08-07 DIAGNOSIS — R260 Ataxic gait: Secondary | ICD-10-CM | POA: Diagnosis not present

## 2015-08-08 DIAGNOSIS — R260 Ataxic gait: Secondary | ICD-10-CM | POA: Diagnosis not present

## 2015-08-08 DIAGNOSIS — W010XXD Fall on same level from slipping, tripping and stumbling without subsequent striking against object, subsequent encounter: Secondary | ICD-10-CM | POA: Diagnosis not present

## 2015-08-08 DIAGNOSIS — I4891 Unspecified atrial fibrillation: Secondary | ICD-10-CM | POA: Diagnosis not present

## 2015-08-08 DIAGNOSIS — R531 Weakness: Secondary | ICD-10-CM | POA: Diagnosis not present

## 2015-08-08 DIAGNOSIS — R278 Other lack of coordination: Secondary | ICD-10-CM | POA: Diagnosis not present

## 2015-08-10 DIAGNOSIS — W010XXD Fall on same level from slipping, tripping and stumbling without subsequent striking against object, subsequent encounter: Secondary | ICD-10-CM | POA: Diagnosis not present

## 2015-08-10 DIAGNOSIS — I4891 Unspecified atrial fibrillation: Secondary | ICD-10-CM | POA: Diagnosis not present

## 2015-08-10 DIAGNOSIS — R260 Ataxic gait: Secondary | ICD-10-CM | POA: Diagnosis not present

## 2015-08-10 DIAGNOSIS — R278 Other lack of coordination: Secondary | ICD-10-CM | POA: Diagnosis not present

## 2015-08-10 DIAGNOSIS — R531 Weakness: Secondary | ICD-10-CM | POA: Diagnosis not present

## 2015-08-11 DIAGNOSIS — R260 Ataxic gait: Secondary | ICD-10-CM | POA: Diagnosis not present

## 2015-08-11 DIAGNOSIS — F332 Major depressive disorder, recurrent severe without psychotic features: Secondary | ICD-10-CM | POA: Diagnosis not present

## 2015-08-11 DIAGNOSIS — W010XXD Fall on same level from slipping, tripping and stumbling without subsequent striking against object, subsequent encounter: Secondary | ICD-10-CM | POA: Diagnosis not present

## 2015-08-11 DIAGNOSIS — R278 Other lack of coordination: Secondary | ICD-10-CM | POA: Diagnosis not present

## 2015-08-11 DIAGNOSIS — R531 Weakness: Secondary | ICD-10-CM | POA: Diagnosis not present

## 2015-08-11 DIAGNOSIS — I4891 Unspecified atrial fibrillation: Secondary | ICD-10-CM | POA: Diagnosis not present

## 2015-08-12 DIAGNOSIS — R531 Weakness: Secondary | ICD-10-CM | POA: Diagnosis not present

## 2015-08-12 DIAGNOSIS — R278 Other lack of coordination: Secondary | ICD-10-CM | POA: Diagnosis not present

## 2015-08-12 DIAGNOSIS — W010XXD Fall on same level from slipping, tripping and stumbling without subsequent striking against object, subsequent encounter: Secondary | ICD-10-CM | POA: Diagnosis not present

## 2015-08-12 DIAGNOSIS — R260 Ataxic gait: Secondary | ICD-10-CM | POA: Diagnosis not present

## 2015-08-12 DIAGNOSIS — I4891 Unspecified atrial fibrillation: Secondary | ICD-10-CM | POA: Diagnosis not present

## 2015-08-13 DIAGNOSIS — R278 Other lack of coordination: Secondary | ICD-10-CM | POA: Diagnosis not present

## 2015-08-13 DIAGNOSIS — I4891 Unspecified atrial fibrillation: Secondary | ICD-10-CM | POA: Diagnosis not present

## 2015-08-13 DIAGNOSIS — R260 Ataxic gait: Secondary | ICD-10-CM | POA: Diagnosis not present

## 2015-08-13 DIAGNOSIS — W010XXD Fall on same level from slipping, tripping and stumbling without subsequent striking against object, subsequent encounter: Secondary | ICD-10-CM | POA: Diagnosis not present

## 2015-08-13 DIAGNOSIS — R531 Weakness: Secondary | ICD-10-CM | POA: Diagnosis not present

## 2015-08-14 DIAGNOSIS — R278 Other lack of coordination: Secondary | ICD-10-CM | POA: Diagnosis not present

## 2015-08-14 DIAGNOSIS — R260 Ataxic gait: Secondary | ICD-10-CM | POA: Diagnosis not present

## 2015-08-14 DIAGNOSIS — I4891 Unspecified atrial fibrillation: Secondary | ICD-10-CM | POA: Diagnosis not present

## 2015-08-14 DIAGNOSIS — R531 Weakness: Secondary | ICD-10-CM | POA: Diagnosis not present

## 2015-08-14 DIAGNOSIS — W010XXD Fall on same level from slipping, tripping and stumbling without subsequent striking against object, subsequent encounter: Secondary | ICD-10-CM | POA: Diagnosis not present

## 2015-08-15 DIAGNOSIS — W010XXD Fall on same level from slipping, tripping and stumbling without subsequent striking against object, subsequent encounter: Secondary | ICD-10-CM | POA: Diagnosis not present

## 2015-08-15 DIAGNOSIS — F332 Major depressive disorder, recurrent severe without psychotic features: Secondary | ICD-10-CM | POA: Diagnosis not present

## 2015-08-15 DIAGNOSIS — R278 Other lack of coordination: Secondary | ICD-10-CM | POA: Diagnosis not present

## 2015-08-15 DIAGNOSIS — I4891 Unspecified atrial fibrillation: Secondary | ICD-10-CM | POA: Diagnosis not present

## 2015-08-15 DIAGNOSIS — R531 Weakness: Secondary | ICD-10-CM | POA: Diagnosis not present

## 2015-08-15 DIAGNOSIS — R260 Ataxic gait: Secondary | ICD-10-CM | POA: Diagnosis not present

## 2015-08-17 DIAGNOSIS — W010XXD Fall on same level from slipping, tripping and stumbling without subsequent striking against object, subsequent encounter: Secondary | ICD-10-CM | POA: Diagnosis not present

## 2015-08-17 DIAGNOSIS — R531 Weakness: Secondary | ICD-10-CM | POA: Diagnosis not present

## 2015-08-17 DIAGNOSIS — R260 Ataxic gait: Secondary | ICD-10-CM | POA: Diagnosis not present

## 2015-08-17 DIAGNOSIS — R278 Other lack of coordination: Secondary | ICD-10-CM | POA: Diagnosis not present

## 2015-08-17 DIAGNOSIS — I4891 Unspecified atrial fibrillation: Secondary | ICD-10-CM | POA: Diagnosis not present

## 2015-08-18 DIAGNOSIS — R278 Other lack of coordination: Secondary | ICD-10-CM | POA: Diagnosis not present

## 2015-08-18 DIAGNOSIS — R531 Weakness: Secondary | ICD-10-CM | POA: Diagnosis not present

## 2015-08-18 DIAGNOSIS — W010XXD Fall on same level from slipping, tripping and stumbling without subsequent striking against object, subsequent encounter: Secondary | ICD-10-CM | POA: Diagnosis not present

## 2015-08-18 DIAGNOSIS — I4891 Unspecified atrial fibrillation: Secondary | ICD-10-CM | POA: Diagnosis not present

## 2015-08-18 DIAGNOSIS — R260 Ataxic gait: Secondary | ICD-10-CM | POA: Diagnosis not present

## 2015-08-19 DIAGNOSIS — R531 Weakness: Secondary | ICD-10-CM | POA: Diagnosis not present

## 2015-08-19 DIAGNOSIS — I4891 Unspecified atrial fibrillation: Secondary | ICD-10-CM | POA: Diagnosis not present

## 2015-08-19 DIAGNOSIS — R260 Ataxic gait: Secondary | ICD-10-CM | POA: Diagnosis not present

## 2015-08-19 DIAGNOSIS — R278 Other lack of coordination: Secondary | ICD-10-CM | POA: Diagnosis not present

## 2015-08-19 DIAGNOSIS — W010XXD Fall on same level from slipping, tripping and stumbling without subsequent striking against object, subsequent encounter: Secondary | ICD-10-CM | POA: Diagnosis not present

## 2015-08-20 DIAGNOSIS — R278 Other lack of coordination: Secondary | ICD-10-CM | POA: Diagnosis not present

## 2015-08-20 DIAGNOSIS — R260 Ataxic gait: Secondary | ICD-10-CM | POA: Diagnosis not present

## 2015-08-20 DIAGNOSIS — R531 Weakness: Secondary | ICD-10-CM | POA: Diagnosis not present

## 2015-08-20 DIAGNOSIS — W010XXD Fall on same level from slipping, tripping and stumbling without subsequent striking against object, subsequent encounter: Secondary | ICD-10-CM | POA: Diagnosis not present

## 2015-08-20 DIAGNOSIS — I4891 Unspecified atrial fibrillation: Secondary | ICD-10-CM | POA: Diagnosis not present

## 2015-08-21 DIAGNOSIS — I4891 Unspecified atrial fibrillation: Secondary | ICD-10-CM | POA: Diagnosis not present

## 2015-08-21 DIAGNOSIS — W010XXD Fall on same level from slipping, tripping and stumbling without subsequent striking against object, subsequent encounter: Secondary | ICD-10-CM | POA: Diagnosis not present

## 2015-08-21 DIAGNOSIS — R531 Weakness: Secondary | ICD-10-CM | POA: Diagnosis not present

## 2015-08-21 DIAGNOSIS — R260 Ataxic gait: Secondary | ICD-10-CM | POA: Diagnosis not present

## 2015-08-21 DIAGNOSIS — R278 Other lack of coordination: Secondary | ICD-10-CM | POA: Diagnosis not present

## 2015-08-22 DIAGNOSIS — F332 Major depressive disorder, recurrent severe without psychotic features: Secondary | ICD-10-CM | POA: Diagnosis not present

## 2015-08-24 DIAGNOSIS — R531 Weakness: Secondary | ICD-10-CM | POA: Diagnosis not present

## 2015-08-24 DIAGNOSIS — R260 Ataxic gait: Secondary | ICD-10-CM | POA: Diagnosis not present

## 2015-08-24 DIAGNOSIS — I4891 Unspecified atrial fibrillation: Secondary | ICD-10-CM | POA: Diagnosis not present

## 2015-08-24 DIAGNOSIS — R278 Other lack of coordination: Secondary | ICD-10-CM | POA: Diagnosis not present

## 2015-08-24 DIAGNOSIS — W010XXD Fall on same level from slipping, tripping and stumbling without subsequent striking against object, subsequent encounter: Secondary | ICD-10-CM | POA: Diagnosis not present

## 2015-08-25 DIAGNOSIS — R278 Other lack of coordination: Secondary | ICD-10-CM | POA: Diagnosis not present

## 2015-08-25 DIAGNOSIS — R260 Ataxic gait: Secondary | ICD-10-CM | POA: Diagnosis not present

## 2015-08-25 DIAGNOSIS — F332 Major depressive disorder, recurrent severe without psychotic features: Secondary | ICD-10-CM | POA: Diagnosis not present

## 2015-08-25 DIAGNOSIS — W010XXD Fall on same level from slipping, tripping and stumbling without subsequent striking against object, subsequent encounter: Secondary | ICD-10-CM | POA: Diagnosis not present

## 2015-08-25 DIAGNOSIS — I4891 Unspecified atrial fibrillation: Secondary | ICD-10-CM | POA: Diagnosis not present

## 2015-08-25 DIAGNOSIS — R531 Weakness: Secondary | ICD-10-CM | POA: Diagnosis not present

## 2015-08-26 DIAGNOSIS — R278 Other lack of coordination: Secondary | ICD-10-CM | POA: Diagnosis not present

## 2015-08-26 DIAGNOSIS — I4891 Unspecified atrial fibrillation: Secondary | ICD-10-CM | POA: Diagnosis not present

## 2015-08-26 DIAGNOSIS — R531 Weakness: Secondary | ICD-10-CM | POA: Diagnosis not present

## 2015-08-26 DIAGNOSIS — W010XXD Fall on same level from slipping, tripping and stumbling without subsequent striking against object, subsequent encounter: Secondary | ICD-10-CM | POA: Diagnosis not present

## 2015-08-26 DIAGNOSIS — R260 Ataxic gait: Secondary | ICD-10-CM | POA: Diagnosis not present

## 2015-08-27 DIAGNOSIS — R278 Other lack of coordination: Secondary | ICD-10-CM | POA: Diagnosis not present

## 2015-08-27 DIAGNOSIS — R260 Ataxic gait: Secondary | ICD-10-CM | POA: Diagnosis not present

## 2015-08-27 DIAGNOSIS — I4891 Unspecified atrial fibrillation: Secondary | ICD-10-CM | POA: Diagnosis not present

## 2015-08-27 DIAGNOSIS — W010XXD Fall on same level from slipping, tripping and stumbling without subsequent striking against object, subsequent encounter: Secondary | ICD-10-CM | POA: Diagnosis not present

## 2015-08-27 DIAGNOSIS — R531 Weakness: Secondary | ICD-10-CM | POA: Diagnosis not present

## 2015-08-28 DIAGNOSIS — R531 Weakness: Secondary | ICD-10-CM | POA: Diagnosis not present

## 2015-08-28 DIAGNOSIS — R260 Ataxic gait: Secondary | ICD-10-CM | POA: Diagnosis not present

## 2015-08-28 DIAGNOSIS — R278 Other lack of coordination: Secondary | ICD-10-CM | POA: Diagnosis not present

## 2015-08-28 DIAGNOSIS — W010XXD Fall on same level from slipping, tripping and stumbling without subsequent striking against object, subsequent encounter: Secondary | ICD-10-CM | POA: Diagnosis not present

## 2015-08-28 DIAGNOSIS — I4891 Unspecified atrial fibrillation: Secondary | ICD-10-CM | POA: Diagnosis not present

## 2015-08-29 DIAGNOSIS — F332 Major depressive disorder, recurrent severe without psychotic features: Secondary | ICD-10-CM | POA: Diagnosis not present

## 2015-08-31 DIAGNOSIS — R278 Other lack of coordination: Secondary | ICD-10-CM | POA: Diagnosis not present

## 2015-08-31 DIAGNOSIS — I4891 Unspecified atrial fibrillation: Secondary | ICD-10-CM | POA: Diagnosis not present

## 2015-08-31 DIAGNOSIS — R531 Weakness: Secondary | ICD-10-CM | POA: Diagnosis not present

## 2015-08-31 DIAGNOSIS — M1611 Unilateral primary osteoarthritis, right hip: Secondary | ICD-10-CM | POA: Diagnosis not present

## 2015-08-31 DIAGNOSIS — W010XXD Fall on same level from slipping, tripping and stumbling without subsequent striking against object, subsequent encounter: Secondary | ICD-10-CM | POA: Diagnosis not present

## 2015-08-31 DIAGNOSIS — F332 Major depressive disorder, recurrent severe without psychotic features: Secondary | ICD-10-CM | POA: Diagnosis not present

## 2015-08-31 DIAGNOSIS — R260 Ataxic gait: Secondary | ICD-10-CM | POA: Diagnosis not present

## 2015-09-01 DIAGNOSIS — R531 Weakness: Secondary | ICD-10-CM | POA: Diagnosis not present

## 2015-09-01 DIAGNOSIS — W010XXD Fall on same level from slipping, tripping and stumbling without subsequent striking against object, subsequent encounter: Secondary | ICD-10-CM | POA: Diagnosis not present

## 2015-09-01 DIAGNOSIS — I4891 Unspecified atrial fibrillation: Secondary | ICD-10-CM | POA: Diagnosis not present

## 2015-09-01 DIAGNOSIS — R278 Other lack of coordination: Secondary | ICD-10-CM | POA: Diagnosis not present

## 2015-09-01 DIAGNOSIS — R260 Ataxic gait: Secondary | ICD-10-CM | POA: Diagnosis not present

## 2015-09-02 DIAGNOSIS — F332 Major depressive disorder, recurrent severe without psychotic features: Secondary | ICD-10-CM | POA: Diagnosis not present

## 2015-09-02 DIAGNOSIS — R531 Weakness: Secondary | ICD-10-CM | POA: Diagnosis not present

## 2015-09-02 DIAGNOSIS — R278 Other lack of coordination: Secondary | ICD-10-CM | POA: Diagnosis not present

## 2015-09-02 DIAGNOSIS — R260 Ataxic gait: Secondary | ICD-10-CM | POA: Diagnosis not present

## 2015-09-02 DIAGNOSIS — I4891 Unspecified atrial fibrillation: Secondary | ICD-10-CM | POA: Diagnosis not present

## 2015-09-02 DIAGNOSIS — W010XXD Fall on same level from slipping, tripping and stumbling without subsequent striking against object, subsequent encounter: Secondary | ICD-10-CM | POA: Diagnosis not present

## 2015-09-05 DIAGNOSIS — M069 Rheumatoid arthritis, unspecified: Secondary | ICD-10-CM | POA: Diagnosis not present

## 2015-09-05 DIAGNOSIS — M13851 Other specified arthritis, right hip: Secondary | ICD-10-CM | POA: Diagnosis not present

## 2015-09-07 DIAGNOSIS — F332 Major depressive disorder, recurrent severe without psychotic features: Secondary | ICD-10-CM | POA: Diagnosis not present

## 2015-09-10 DIAGNOSIS — F332 Major depressive disorder, recurrent severe without psychotic features: Secondary | ICD-10-CM | POA: Diagnosis not present

## 2015-09-13 DIAGNOSIS — F332 Major depressive disorder, recurrent severe without psychotic features: Secondary | ICD-10-CM | POA: Diagnosis not present

## 2015-09-17 DIAGNOSIS — B351 Tinea unguium: Secondary | ICD-10-CM | POA: Diagnosis not present

## 2015-09-17 DIAGNOSIS — F332 Major depressive disorder, recurrent severe without psychotic features: Secondary | ICD-10-CM | POA: Diagnosis not present

## 2015-09-17 DIAGNOSIS — M79675 Pain in left toe(s): Secondary | ICD-10-CM | POA: Diagnosis not present

## 2015-09-18 DIAGNOSIS — E785 Hyperlipidemia, unspecified: Secondary | ICD-10-CM | POA: Diagnosis not present

## 2015-09-18 DIAGNOSIS — R740 Nonspecific elevation of levels of transaminase and lactic acid dehydrogenase [LDH]: Secondary | ICD-10-CM | POA: Diagnosis not present

## 2015-09-18 DIAGNOSIS — I10 Essential (primary) hypertension: Secondary | ICD-10-CM | POA: Diagnosis not present

## 2015-09-18 DIAGNOSIS — R770 Abnormality of albumin: Secondary | ICD-10-CM | POA: Diagnosis not present

## 2015-09-21 DIAGNOSIS — F332 Major depressive disorder, recurrent severe without psychotic features: Secondary | ICD-10-CM | POA: Diagnosis not present

## 2015-09-25 DIAGNOSIS — F332 Major depressive disorder, recurrent severe without psychotic features: Secondary | ICD-10-CM | POA: Diagnosis not present

## 2015-09-29 DIAGNOSIS — F332 Major depressive disorder, recurrent severe without psychotic features: Secondary | ICD-10-CM | POA: Diagnosis not present

## 2015-10-01 DIAGNOSIS — F332 Major depressive disorder, recurrent severe without psychotic features: Secondary | ICD-10-CM | POA: Diagnosis not present

## 2015-10-07 DIAGNOSIS — F332 Major depressive disorder, recurrent severe without psychotic features: Secondary | ICD-10-CM | POA: Diagnosis not present

## 2015-10-09 DIAGNOSIS — F332 Major depressive disorder, recurrent severe without psychotic features: Secondary | ICD-10-CM | POA: Diagnosis not present

## 2015-10-12 DIAGNOSIS — F332 Major depressive disorder, recurrent severe without psychotic features: Secondary | ICD-10-CM | POA: Diagnosis not present

## 2015-10-14 DIAGNOSIS — F332 Major depressive disorder, recurrent severe without psychotic features: Secondary | ICD-10-CM | POA: Diagnosis not present

## 2015-10-18 DIAGNOSIS — F332 Major depressive disorder, recurrent severe without psychotic features: Secondary | ICD-10-CM | POA: Diagnosis not present

## 2015-10-22 DIAGNOSIS — F332 Major depressive disorder, recurrent severe without psychotic features: Secondary | ICD-10-CM | POA: Diagnosis not present

## 2015-10-25 DIAGNOSIS — F332 Major depressive disorder, recurrent severe without psychotic features: Secondary | ICD-10-CM | POA: Diagnosis not present

## 2015-10-28 DIAGNOSIS — F332 Major depressive disorder, recurrent severe without psychotic features: Secondary | ICD-10-CM | POA: Diagnosis not present

## 2015-11-01 DIAGNOSIS — M069 Rheumatoid arthritis, unspecified: Secondary | ICD-10-CM | POA: Diagnosis not present

## 2015-11-02 DIAGNOSIS — I10 Essential (primary) hypertension: Secondary | ICD-10-CM | POA: Diagnosis not present

## 2015-11-02 DIAGNOSIS — J449 Chronic obstructive pulmonary disease, unspecified: Secondary | ICD-10-CM | POA: Diagnosis not present

## 2015-11-02 DIAGNOSIS — R1312 Dysphagia, oropharyngeal phase: Secondary | ICD-10-CM | POA: Diagnosis not present

## 2015-11-02 DIAGNOSIS — M069 Rheumatoid arthritis, unspecified: Secondary | ICD-10-CM | POA: Diagnosis not present

## 2015-11-08 DIAGNOSIS — F332 Major depressive disorder, recurrent severe without psychotic features: Secondary | ICD-10-CM | POA: Diagnosis not present

## 2015-11-10 DIAGNOSIS — F332 Major depressive disorder, recurrent severe without psychotic features: Secondary | ICD-10-CM | POA: Diagnosis not present

## 2015-11-18 DIAGNOSIS — F332 Major depressive disorder, recurrent severe without psychotic features: Secondary | ICD-10-CM | POA: Diagnosis not present

## 2015-11-19 DIAGNOSIS — F329 Major depressive disorder, single episode, unspecified: Secondary | ICD-10-CM | POA: Diagnosis not present

## 2015-11-19 DIAGNOSIS — F411 Generalized anxiety disorder: Secondary | ICD-10-CM | POA: Diagnosis not present

## 2015-11-19 DIAGNOSIS — G4700 Insomnia, unspecified: Secondary | ICD-10-CM | POA: Diagnosis not present

## 2015-11-20 DIAGNOSIS — E039 Hypothyroidism, unspecified: Secondary | ICD-10-CM | POA: Diagnosis not present

## 2015-11-20 DIAGNOSIS — E559 Vitamin D deficiency, unspecified: Secondary | ICD-10-CM | POA: Diagnosis not present

## 2015-11-20 DIAGNOSIS — D529 Folate deficiency anemia, unspecified: Secondary | ICD-10-CM | POA: Diagnosis not present

## 2015-11-20 DIAGNOSIS — D519 Vitamin B12 deficiency anemia, unspecified: Secondary | ICD-10-CM | POA: Diagnosis not present

## 2015-11-21 DIAGNOSIS — F332 Major depressive disorder, recurrent severe without psychotic features: Secondary | ICD-10-CM | POA: Diagnosis not present

## 2015-11-24 DIAGNOSIS — F332 Major depressive disorder, recurrent severe without psychotic features: Secondary | ICD-10-CM | POA: Diagnosis not present

## 2015-11-28 DIAGNOSIS — F332 Major depressive disorder, recurrent severe without psychotic features: Secondary | ICD-10-CM | POA: Diagnosis not present

## 2015-11-30 DIAGNOSIS — F332 Major depressive disorder, recurrent severe without psychotic features: Secondary | ICD-10-CM | POA: Diagnosis not present

## 2015-12-05 DIAGNOSIS — F332 Major depressive disorder, recurrent severe without psychotic features: Secondary | ICD-10-CM | POA: Diagnosis not present

## 2015-12-08 DIAGNOSIS — M79675 Pain in left toe(s): Secondary | ICD-10-CM | POA: Diagnosis not present

## 2015-12-08 DIAGNOSIS — B351 Tinea unguium: Secondary | ICD-10-CM | POA: Diagnosis not present

## 2015-12-09 DIAGNOSIS — F332 Major depressive disorder, recurrent severe without psychotic features: Secondary | ICD-10-CM | POA: Diagnosis not present

## 2015-12-14 DIAGNOSIS — F332 Major depressive disorder, recurrent severe without psychotic features: Secondary | ICD-10-CM | POA: Diagnosis not present

## 2015-12-23 ENCOUNTER — Ambulatory Visit: Payer: Medicare Other | Admitting: Cardiology

## 2015-12-24 DIAGNOSIS — M069 Rheumatoid arthritis, unspecified: Secondary | ICD-10-CM | POA: Diagnosis not present

## 2015-12-25 ENCOUNTER — Ambulatory Visit: Payer: Medicare Other | Admitting: Cardiology

## 2015-12-25 NOTE — Progress Notes (Unsigned)
Clinical Summary Ms. Hibbitts is a 82 y.o.female seen today for follow up of the following medical problems.  1. CAD  - prior MI in 1997, non-obstructive disease by cath at that time.  - echo 2008 LVEF 60%, mild AI  - history of ASA allergy, has not been taking  - denies any chest pain. Stable SOB with walking, though limited also due to chronic leg pain. - compliant with meds  2. HTN  - compliant with meds   3. Hyperlipidemia  - compliant statin - last lipid panel 09/2014 TC 126 TG 96 HDL 44 LDL 63 Past Medical History  Diagnosis Date  . MI, acute, non ST segment elevation (HCC) 1997    Questionable lesion in the circumflex at catheterization; normal EF; chronic dyspnea without specific cause identified  . Hyperlipidemia   . Hypertension   . Postmenopausal     Estrogen replacement therapy  . Cough     Prolonged, possible asthmatic bronchitis  . Benign essential tremor   . Osteopenia     Versus osteoporosis  . Macular degeneration   . Degenerative joint disease     versus rheumatoid arthritis  . Repeated falls      Allergies  Allergen Reactions  . Aspirin     GI symptoms  . Bee Venom   . Morphine And Related   . Sulfa Antibiotics     dyspnea  . Tetracyclines & Related   . Codeine Rash  . Pamelor Rash  . Penicillins Rash     Current Outpatient Prescriptions  Medication Sig Dispense Refill  . acetaminophen (TYLENOL) 500 MG tablet Take 500 mg by mouth every 6 (six) hours as needed.    [name] albuterol (ACCUNEB) 1.25 MG/3ML nebulizer solution Take 1 ampule by nebulization every 4 (four) hours as needed for wheezing.    [name] ALPRAZolam (XANAX) 0.25 MG tablet Take 0.25 mg by mouth 2 (two) times daily. For anxiety    . alum & mag hydroxide-simeth (MYLANTA) 200-200-20 MG/5ML suspension Take 15 mLs by mouth at bedtime.    . calcium-vitamin D (OSCAL WITH D) 500-200 MG-UNIT tablet Take 1 tablet by mouth daily.    [name] dextromethorphan (DELSYM) 30 MG/5ML liquid Take  60 mg by mouth 2 (two) times daily as needed. For  cough    . fluticasone (FLONASE) 50 MCG/ACT nasal spray Place 2 sprays into the nose daily.      . furosemide (LASIX) 40 MG tablet Take 40 mg by mouth daily.     [name] guaifenesin (HUMIBID E) 400 MG TABS tablet Take 600 mg by mouth 2 (two) times daily.     [name] guar gum powder Take 12 g by mouth 2 (two) times daily. In 8 oz of water    . KLOR-CON M20 20 MEQ tablet Take 1 tablet by mouth daily.    [name] loratadine (CLARITIN) 10 MG tablet Take 10 mg by mouth daily.    [name] losartan (COZAAR) 25 MG tablet Take 1 tablet by mouth daily.    . Multiple Vitamin (MULTIVITAMIN) tablet Take 1 tablet by mouth daily.      . nabumetone (RELAFEN) 500 MG tablet Take 500 mg by mouth daily.     . nitroGLYCERIN (NITROSTAT) 0.4 MG SL tablet Place 0.4 mg under the tongue as directed.      [name] omeprazole (PRILOSEC) 20 MG capsule Take 1 capsule by mouth daily.    Bertram Gala Glycol-Propyl Glycol (SYSTANE) 0.4-0.3 % SOLN Apply 1 drop to eye  3 (three) times daily.      . polyethylene glycol powder (GLYCOLAX/MIRALAX) powder Take 17 g by mouth daily.    . pravastatin (PRAVACHOL) 40 MG tablet Take 40 mg by mouth at bedtime.      . promethazine (PHENERGAN) 25 MG tablet Take 12.5 mg by mouth every 4 (four) hours as needed. For nausea    . SPIRIVA HANDIHALER 18 MCG inhalation capsule Place 1 puff into inhaler and inhale at bedtime.    . SYMBICORT 160-4.5 MCG/ACT inhaler Inhale 2 Inhalers into the lungs 2 (two) times daily.    . traMADol (ULTRAM) 50 MG tablet Take 50 mg by mouth every 6 (six) hours as needed. For pain    . venlafaxine XR (EFFEXOR-XR) 150 MG 24 hr capsule Take 1 capsule by mouth daily.    [name] venlafaxine XR (EFFEXOR-XR) 37.5 MG 24 hr capsule Take 1 capsule by mouth daily.     No current facility-administered medications for this visit.     Past Surgical History  Procedure Laterality Date  . Bladder suspension    . Inguinal hernia repair      Left  . Foot surgery       Bilateral  . Ercp  04/18/2012    Procedure: ENDOSCOPIC RETROGRADE CHOLANGIOPANCREATOGRAPHY (ERCP);  Surgeon: Malissa Hippo, MD;  Location: [facility];  Service: Endoscopy;  Laterality: N/A;  Diagnostic     Allergies  Allergen Reactions  . Aspirin     GI symptoms  . Bee Venom   . Morphine And Related   . Sulfa Antibiotics     dyspnea  . Tetracyclines & Related   . Codeine Rash  . Pamelor Rash  . Penicillins Rash      Family History  Problem Relation Age of Onset  . Coronary artery disease    . Stroke       Social History Ms. Buhrman reports that she has never smoked. She has never used smokeless tobacco. Ms. Tosh reports that she does not drink alcohol.   Review of Systems CONSTITUTIONAL: No weight loss, fever, chills, weakness or fatigue.  HEENT: Eyes: No visual loss, blurred vision, double vision or yellow sclerae.No hearing loss, sneezing, congestion, runny nose or sore throat.  SKIN: No rash or itching.  CARDIOVASCULAR:  RESPIRATORY: No shortness of breath, cough or sputum.  GASTROINTESTINAL: No anorexia, nausea, vomiting or diarrhea. No abdominal pain or blood.  GENITOURINARY: No burning on urination, no polyuria NEUROLOGICAL: No headache, dizziness, syncope, paralysis, ataxia, numbness or tingling in the extremities. No change in bowel or bladder control.  MUSCULOSKELETAL: No muscle, back pain, joint pain or stiffness.  LYMPHATICS: No enlarged nodes. No history of splenectomy.  PSYCHIATRIC: No history of depression or anxiety.  ENDOCRINOLOGIC: No reports of sweating, cold or heat intolerance. No polyuria or polydipsia.  [name]   Physical Examination There were no vitals filed for this visit. There were no vitals filed for this visit.  Gen: resting comfortably, no acute distress HEENT: no scleral icterus, pupils equal round and reactive, no palptable cervical adenopathy,  CV Resp: Clear to auscultation bilaterally GI: abdomen is soft, non-tender,  non-distended, normal bowel sounds, no hepatosplenomegaly MSK: extremities are warm, no edema.  Skin: warm, no rash Neuro:  no focal deficits Psych: appropriate affect   Diagnostic Studies 10/29/13 Echo Study Conclusions  - Left ventricle: The cavity size was normal. Wall thickness was increased in a pattern of mild LVH. Systolic function was normal. The estimated ejection fraction was in the range  of 55% to 60%. Wall motion was normal; there were no regional wall motion abnormalities. There was an increased relative contribution of atrial contraction to ventricular filling. Doppler parameters are consistent with abnormal left ventricular relaxation (grade 1 diastolic dysfunction). - Aortic valve: Mildly calcified annulus. Trileaflet; mildly thickened, mildly calcified leaflets. Moderate, centralregurgitation. Minimal aortic stenosis. Peak velocity: 205cm/s (S). Mean gradient: 55mm Hg (S). Valve area: 1.85cm^2(VTI). - Mitral valve: Calcified annulus. Mildly thickened leaflets . Mild regurgitation. - Left atrium: The atrium was mildly dilated. - Tricuspid valve: Mildly thickened leaflets. Mild-moderate regurgitation. - Pulmonary arteries: PA peak pressure: 43mm Hg (S). Mild to moderately elevated pulmonary pressures.    Assessment and Plan  1. CAD  - no current symptoms - continue secondary prevention and risk factor modification.   2. HTN  - at goal, continue current meds    3. Hyperlipidemia  - at goal - continue current statin  F/u 1 year      Antoine Poche, M.D., F.A.C.C.

## 2015-12-31 DIAGNOSIS — F332 Major depressive disorder, recurrent severe without psychotic features: Secondary | ICD-10-CM | POA: Diagnosis not present

## 2016-01-04 DIAGNOSIS — F332 Major depressive disorder, recurrent severe without psychotic features: Secondary | ICD-10-CM | POA: Diagnosis not present

## 2016-01-07 DIAGNOSIS — F332 Major depressive disorder, recurrent severe without psychotic features: Secondary | ICD-10-CM | POA: Diagnosis not present

## 2016-01-10 DIAGNOSIS — F332 Major depressive disorder, recurrent severe without psychotic features: Secondary | ICD-10-CM | POA: Diagnosis not present

## 2016-01-13 DIAGNOSIS — F332 Major depressive disorder, recurrent severe without psychotic features: Secondary | ICD-10-CM | POA: Diagnosis not present

## 2016-01-14 ENCOUNTER — Ambulatory Visit (INDEPENDENT_AMBULATORY_CARE_PROVIDER_SITE_OTHER): Payer: Medicare Other | Admitting: Cardiology

## 2016-01-14 ENCOUNTER — Encounter: Payer: Self-pay | Admitting: *Deleted

## 2016-01-14 ENCOUNTER — Encounter: Payer: Self-pay | Admitting: Cardiology

## 2016-01-14 VITALS — BP 113/65 | HR 63 | Ht 59.0 in | Wt 106.0 lb

## 2016-01-14 DIAGNOSIS — R600 Localized edema: Secondary | ICD-10-CM | POA: Diagnosis not present

## 2016-01-14 DIAGNOSIS — I10 Essential (primary) hypertension: Secondary | ICD-10-CM | POA: Diagnosis not present

## 2016-01-14 DIAGNOSIS — E785 Hyperlipidemia, unspecified: Secondary | ICD-10-CM | POA: Diagnosis not present

## 2016-01-14 DIAGNOSIS — I2510 Atherosclerotic heart disease of native coronary artery without angina pectoris: Secondary | ICD-10-CM | POA: Diagnosis not present

## 2016-01-14 MED ORDER — FUROSEMIDE 20 MG PO TABS: 60.0000 mg | ORAL_TABLET | Freq: Every day | ORAL | Status: DC

## 2016-01-14 NOTE — Progress Notes (Signed)
Clinical Summary Candice Meza is a 82 y.o.female seen today for follow up of the following medical problems.  1. CAD  - prior MI in 1997, non-obstructive disease by cath at that time.  - echo 2008 LVEF 60%, mild AI  - history of ASA allergy, has not been taking   - denies any chest pain. Has had some SOB and orthopnea,. Has had some recent LE edema.   2. HTN  - compliant with meds   3. Hyperlipidemia  - compliant statin - last lipid panel 09/2014 TC 126 TG 96 HDL 44 LDL 63 Past Medical History  Diagnosis Date  . MI, acute, non ST segment elevation (HCC) 1997    Questionable lesion in the circumflex at catheterization; normal EF; chronic dyspnea without specific cause identified  . Hyperlipidemia   . Hypertension   . Postmenopausal     Estrogen replacement therapy  . Cough     Prolonged, possible asthmatic bronchitis  . Benign essential tremor   . Osteopenia     Versus osteoporosis  . Macular degeneration   . Degenerative joint disease     versus rheumatoid arthritis  . Repeated falls      Allergies  Allergen Reactions  . Aspirin     GI symptoms  . Bee Venom   . Morphine And Related   . Sulfa Antibiotics     dyspnea  . Tetracyclines & Related   . Codeine Rash  . Pamelor Rash  . Penicillins Rash     Current Outpatient Prescriptions  Medication Sig Dispense Refill  . acetaminophen (TYLENOL) 500 MG tablet Take 500 mg by mouth every 6 (six) hours as needed.    [name] albuterol (ACCUNEB) 1.25 MG/3ML nebulizer solution Take 1 ampule by nebulization every 4 (four) hours as needed for wheezing.    [name] ALPRAZolam (XANAX) 0.25 MG tablet Take 0.25 mg by mouth 2 (two) times daily. For anxiety    . alum & mag hydroxide-simeth (MYLANTA) 200-200-20 MG/5ML suspension Take 15 mLs by mouth at bedtime.    . calcium-vitamin D (OSCAL WITH D) 500-200 MG-UNIT tablet Take 1 tablet by mouth daily.    [name] dextromethorphan (DELSYM) 30 MG/5ML liquid Take 60 mg by mouth 2 (two)  times daily as needed. For  cough    . fluticasone (FLONASE) 50 MCG/ACT nasal spray Place 2 sprays into the nose daily.      . furosemide (LASIX) 40 MG tablet Take 40 mg by mouth daily.     [name] guaifenesin (HUMIBID E) 400 MG TABS tablet Take 600 mg by mouth 2 (two) times daily.     [name] guar gum powder Take 12 g by mouth 2 (two) times daily. In 8 oz of water    . KLOR-CON M20 20 MEQ tablet Take 1 tablet by mouth daily.    [name] loratadine (CLARITIN) 10 MG tablet Take 10 mg by mouth daily.    [name] losartan (COZAAR) 25 MG tablet Take 1 tablet by mouth daily.    . Multiple Vitamin (MULTIVITAMIN) tablet Take 1 tablet by mouth daily.      . nabumetone (RELAFEN) 500 MG tablet Take 500 mg by mouth daily.     . nitroGLYCERIN (NITROSTAT) 0.4 MG SL tablet Place 0.4 mg under the tongue as directed.      [name] omeprazole (PRILOSEC) 20 MG capsule Take 1 capsule by mouth daily.    Bertram Gala Glycol-Propyl Glycol (SYSTANE) 0.4-0.3 % SOLN Apply 1 drop to eye 3 (three)  times daily.      . polyethylene glycol powder (GLYCOLAX/MIRALAX) powder Take 17 g by mouth daily.    . pravastatin (PRAVACHOL) 40 MG tablet Take 40 mg by mouth at bedtime.      . promethazine (PHENERGAN) 25 MG tablet Take 12.5 mg by mouth every 4 (four) hours as needed. For nausea    . SPIRIVA HANDIHALER 18 MCG inhalation capsule Place 1 puff into inhaler and inhale at bedtime.    . SYMBICORT 160-4.5 MCG/ACT inhaler Inhale 2 Inhalers into the lungs 2 (two) times daily.    . traMADol (ULTRAM) 50 MG tablet Take 50 mg by mouth every 6 (six) hours as needed. For pain    . venlafaxine XR (EFFEXOR-XR) 150 MG 24 hr capsule Take 1 capsule by mouth daily.    [name] venlafaxine XR (EFFEXOR-XR) 37.5 MG 24 hr capsule Take 1 capsule by mouth daily.     No current facility-administered medications for this visit.     Past Surgical History  Procedure Laterality Date  . Bladder suspension    . Inguinal hernia repair      Left  . Foot surgery      Bilateral  . Ercp   04/18/2012    Procedure: ENDOSCOPIC RETROGRADE CHOLANGIOPANCREATOGRAPHY (ERCP);  Surgeon: Malissa Hippo, MD;  Location: [facility];  Service: Endoscopy;  Laterality: N/A;  Diagnostic     Allergies  Allergen Reactions  . Aspirin     GI symptoms  . Bee Venom   . Morphine And Related   . Sulfa Antibiotics     dyspnea  . Tetracyclines & Related   . Codeine Rash  . Pamelor Rash  . Penicillins Rash      Family History  Problem Relation Age of Onset  . Coronary artery disease    . Stroke       Social History Ms. Varas reports that she has never smoked. She has never used smokeless tobacco. Ms. Volcy reports that she does not drink alcohol.   Review of Systems CONSTITUTIONAL: No weight loss, fever, chills, weakness or fatigue.  HEENT: Eyes: No visual loss, blurred vision, double vision or yellow sclerae.No hearing loss, sneezing, congestion, runny nose or sore throat.  SKIN: No rash or itching.  CARDIOVASCULAR: per HPI RESPIRATORY: No shortness of breath, cough or sputum.  GASTROINTESTINAL: No anorexia, nausea, vomiting or diarrhea. No abdominal pain or blood.  GENITOURINARY: No burning on urination, no polyuria NEUROLOGICAL: No headache, dizziness, syncope, paralysis, ataxia, numbness or tingling in the extremities. No change in bowel or bladder control.  MUSCULOSKELETAL: No muscle, back pain, joint pain or stiffness.  LYMPHATICS: No enlarged nodes. No history of splenectomy.  PSYCHIATRIC: No history of depression or anxiety.  ENDOCRINOLOGIC: No reports of sweating, cold or heat intolerance. No polyuria or polydipsia.  [name]   Physical Examination Filed Vitals:   01/14/16 0821  BP: 113/65  Pulse: 63   Filed Vitals:   01/14/16 0821  Height: 4\' 11"  (1.499 m)  Weight: 106 lb (48.081 kg)    Gen: resting comfortably, no acute distress HEENT: no scleral icterus, pupils equal round and reactive, no palptable cervical adenopathy,  CV: RRR, no m/r/g, no jvd Resp:  Clear to auscultation bilaterally GI: abdomen is soft, non-tender, non-distended, normal bowel sounds, no hepatosplenomegaly MSK: extremities are warm, trace bilateral edema Skin: warm, no rash Neuro:  no focal deficits Psych: appropriate affect   Diagnostic Studies  10/29/13 Echo Study Conclusions  - Left ventricle: The cavity size was  normal. Wall thickness was increased in a pattern of mild LVH. Systolic function was normal. The estimated ejection fraction was in the range of 55% to 60%. Wall motion was normal; there were no regional wall motion abnormalities. There was an increased relative contribution of atrial contraction to ventricular filling. Doppler parameters are consistent with abnormal left ventricular relaxation (grade 1 diastolic dysfunction). - Aortic valve: Mildly calcified annulus. Trileaflet; mildly thickened, mildly calcified leaflets. Moderate, centralregurgitation. Minimal aortic stenosis. Peak velocity: 205cm/s (S). Mean gradient: 72mm Hg (S). Valve area: 1.85cm^2(VTI). - Mitral valve: Calcified annulus. Mildly thickened leaflets . Mild regurgitation. - Left atrium: The atrium was mildly dilated. - Tricuspid valve: Mildly thickened leaflets. Mild-moderate regurgitation. - Pulmonary arteries: PA peak pressure: 78mm Hg (S). Mild to moderately elevated pulmonary pressures.    Assessment and Plan  1. CAD  - no current chest pain. EKG in clinic shows NSR, no acute ischemic changes - we will continue current meds  2. HTN  - at goal, we will continue current meds    3. Hyperlipidemia  - at goal - she will continue statin. Repeat lipid panel  4. LE edema - she will increase lasix to 60mg  daily, check BMET and Mg in 2 weeks.       , M.D.

## 2016-01-14 NOTE — Patient Instructions (Signed)
Your physician recommends that you schedule a follow-up appointment in: 1 MONTH WITH DR. BRANCH  Your physician has recommended you make the following change in your medication:   INCREASE LASIX 60 MG DAILY  Your physician recommends that you return for lab work in: 2 WEEKS - PLEASE FAST PRIOR TO LAB WORK - BMP/MG/LIPIDS  Thank you for choosing [facility]!!

## 2016-01-17 DIAGNOSIS — F332 Major depressive disorder, recurrent severe without psychotic features: Secondary | ICD-10-CM | POA: Diagnosis not present

## 2016-01-19 DIAGNOSIS — F332 Major depressive disorder, recurrent severe without psychotic features: Secondary | ICD-10-CM | POA: Diagnosis not present

## 2016-01-22 DIAGNOSIS — R279 Unspecified lack of coordination: Secondary | ICD-10-CM | POA: Diagnosis not present

## 2016-01-22 DIAGNOSIS — R278 Other lack of coordination: Secondary | ICD-10-CM | POA: Diagnosis not present

## 2016-01-22 DIAGNOSIS — F419 Anxiety disorder, unspecified: Secondary | ICD-10-CM | POA: Diagnosis not present

## 2016-01-22 DIAGNOSIS — R260 Ataxic gait: Secondary | ICD-10-CM | POA: Diagnosis not present

## 2016-01-22 DIAGNOSIS — M159 Polyosteoarthritis, unspecified: Secondary | ICD-10-CM | POA: Diagnosis not present

## 2016-01-22 DIAGNOSIS — I4891 Unspecified atrial fibrillation: Secondary | ICD-10-CM | POA: Diagnosis not present

## 2016-01-22 DIAGNOSIS — R531 Weakness: Secondary | ICD-10-CM | POA: Diagnosis not present

## 2016-01-22 DIAGNOSIS — W010XXD Fall on same level from slipping, tripping and stumbling without subsequent striking against object, subsequent encounter: Secondary | ICD-10-CM | POA: Diagnosis not present

## 2016-01-22 DIAGNOSIS — M6281 Muscle weakness (generalized): Secondary | ICD-10-CM | POA: Diagnosis not present

## 2016-01-24 DIAGNOSIS — F332 Major depressive disorder, recurrent severe without psychotic features: Secondary | ICD-10-CM | POA: Diagnosis not present

## 2016-01-25 DIAGNOSIS — R531 Weakness: Secondary | ICD-10-CM | POA: Diagnosis not present

## 2016-01-25 DIAGNOSIS — W010XXD Fall on same level from slipping, tripping and stumbling without subsequent striking against object, subsequent encounter: Secondary | ICD-10-CM | POA: Diagnosis not present

## 2016-01-25 DIAGNOSIS — I4891 Unspecified atrial fibrillation: Secondary | ICD-10-CM | POA: Diagnosis not present

## 2016-01-25 DIAGNOSIS — M159 Polyosteoarthritis, unspecified: Secondary | ICD-10-CM | POA: Diagnosis not present

## 2016-01-25 DIAGNOSIS — R260 Ataxic gait: Secondary | ICD-10-CM | POA: Diagnosis not present

## 2016-01-25 DIAGNOSIS — R278 Other lack of coordination: Secondary | ICD-10-CM | POA: Diagnosis not present

## 2016-01-26 DIAGNOSIS — R278 Other lack of coordination: Secondary | ICD-10-CM | POA: Diagnosis not present

## 2016-01-26 DIAGNOSIS — W010XXD Fall on same level from slipping, tripping and stumbling without subsequent striking against object, subsequent encounter: Secondary | ICD-10-CM | POA: Diagnosis not present

## 2016-01-26 DIAGNOSIS — M159 Polyosteoarthritis, unspecified: Secondary | ICD-10-CM | POA: Diagnosis not present

## 2016-01-26 DIAGNOSIS — I4891 Unspecified atrial fibrillation: Secondary | ICD-10-CM | POA: Diagnosis not present

## 2016-01-26 DIAGNOSIS — F332 Major depressive disorder, recurrent severe without psychotic features: Secondary | ICD-10-CM | POA: Diagnosis not present

## 2016-01-26 DIAGNOSIS — R531 Weakness: Secondary | ICD-10-CM | POA: Diagnosis not present

## 2016-01-26 DIAGNOSIS — R260 Ataxic gait: Secondary | ICD-10-CM | POA: Diagnosis not present

## 2016-01-27 DIAGNOSIS — I10 Essential (primary) hypertension: Secondary | ICD-10-CM | POA: Diagnosis not present

## 2016-01-27 DIAGNOSIS — M159 Polyosteoarthritis, unspecified: Secondary | ICD-10-CM | POA: Diagnosis not present

## 2016-01-27 DIAGNOSIS — W010XXD Fall on same level from slipping, tripping and stumbling without subsequent striking against object, subsequent encounter: Secondary | ICD-10-CM | POA: Diagnosis not present

## 2016-01-27 DIAGNOSIS — E785 Hyperlipidemia, unspecified: Secondary | ICD-10-CM | POA: Diagnosis not present

## 2016-01-27 DIAGNOSIS — R278 Other lack of coordination: Secondary | ICD-10-CM | POA: Diagnosis not present

## 2016-01-27 DIAGNOSIS — I4891 Unspecified atrial fibrillation: Secondary | ICD-10-CM | POA: Diagnosis not present

## 2016-01-27 DIAGNOSIS — R260 Ataxic gait: Secondary | ICD-10-CM | POA: Diagnosis not present

## 2016-01-27 DIAGNOSIS — R531 Weakness: Secondary | ICD-10-CM | POA: Diagnosis not present

## 2016-01-29 DIAGNOSIS — R531 Weakness: Secondary | ICD-10-CM | POA: Diagnosis not present

## 2016-01-29 DIAGNOSIS — W010XXD Fall on same level from slipping, tripping and stumbling without subsequent striking against object, subsequent encounter: Secondary | ICD-10-CM | POA: Diagnosis not present

## 2016-01-29 DIAGNOSIS — M159 Polyosteoarthritis, unspecified: Secondary | ICD-10-CM | POA: Diagnosis not present

## 2016-01-29 DIAGNOSIS — R260 Ataxic gait: Secondary | ICD-10-CM | POA: Diagnosis not present

## 2016-01-29 DIAGNOSIS — R278 Other lack of coordination: Secondary | ICD-10-CM | POA: Diagnosis not present

## 2016-01-29 DIAGNOSIS — I4891 Unspecified atrial fibrillation: Secondary | ICD-10-CM | POA: Diagnosis not present

## 2016-02-01 DIAGNOSIS — G4700 Insomnia, unspecified: Secondary | ICD-10-CM | POA: Diagnosis not present

## 2016-02-01 DIAGNOSIS — F411 Generalized anxiety disorder: Secondary | ICD-10-CM | POA: Diagnosis not present

## 2016-02-01 DIAGNOSIS — F329 Major depressive disorder, single episode, unspecified: Secondary | ICD-10-CM | POA: Diagnosis not present

## 2016-02-02 DIAGNOSIS — M069 Rheumatoid arthritis, unspecified: Secondary | ICD-10-CM | POA: Diagnosis not present

## 2016-02-02 DIAGNOSIS — I10 Essential (primary) hypertension: Secondary | ICD-10-CM | POA: Diagnosis not present

## 2016-02-02 DIAGNOSIS — R05 Cough: Secondary | ICD-10-CM | POA: Diagnosis not present

## 2016-02-02 DIAGNOSIS — J449 Chronic obstructive pulmonary disease, unspecified: Secondary | ICD-10-CM | POA: Diagnosis not present

## 2016-02-07 DIAGNOSIS — R634 Abnormal weight loss: Secondary | ICD-10-CM | POA: Diagnosis not present

## 2016-02-07 DIAGNOSIS — F332 Major depressive disorder, recurrent severe without psychotic features: Secondary | ICD-10-CM | POA: Diagnosis not present

## 2016-02-09 DIAGNOSIS — M79675 Pain in left toe(s): Secondary | ICD-10-CM | POA: Diagnosis not present

## 2016-02-09 DIAGNOSIS — B351 Tinea unguium: Secondary | ICD-10-CM | POA: Diagnosis not present

## 2016-02-09 DIAGNOSIS — F332 Major depressive disorder, recurrent severe without psychotic features: Secondary | ICD-10-CM | POA: Diagnosis not present

## 2016-02-14 DIAGNOSIS — F332 Major depressive disorder, recurrent severe without psychotic features: Secondary | ICD-10-CM | POA: Diagnosis not present

## 2016-02-23 ENCOUNTER — Ambulatory Visit: Payer: Medicare Other | Admitting: Cardiology

## 2016-02-24 ENCOUNTER — Ambulatory Visit (INDEPENDENT_AMBULATORY_CARE_PROVIDER_SITE_OTHER): Payer: Medicare Other | Admitting: Cardiology

## 2016-02-24 ENCOUNTER — Encounter: Payer: Self-pay | Admitting: Cardiology

## 2016-02-24 VITALS — BP 106/62 | HR 74 | Ht 59.0 in | Wt 107.0 lb

## 2016-02-24 DIAGNOSIS — R600 Localized edema: Secondary | ICD-10-CM

## 2016-02-24 DIAGNOSIS — I10 Essential (primary) hypertension: Secondary | ICD-10-CM | POA: Diagnosis not present

## 2016-02-24 DIAGNOSIS — I2510 Atherosclerotic heart disease of native coronary artery without angina pectoris: Secondary | ICD-10-CM | POA: Diagnosis not present

## 2016-02-24 MED ORDER — FUROSEMIDE 40 MG PO TABS: 40.0000 mg | ORAL_TABLET | Freq: Two times a day (BID) | ORAL | 3 refills | Status: DC

## 2016-02-24 NOTE — Patient Instructions (Signed)
Your physician recommends that you schedule a follow-up appointment in: 6 WEEKS WITH DR. BRANCH  Your physician has recommended you make the following change in your medication:   INCREASE LASIX 40 MG TWICE DAILY  Your physician recommends that you return for lab work in: 2 WEEKS BMP/MG  Thank you for choosing [facility]!!

## 2016-02-24 NOTE — Progress Notes (Signed)
Clinical Summary Candice Meza is a 82 y.o.female seen today for follow up of the following medical problems.  1. CAD  - prior MI in 1997, non-obstructive disease by cath at that time.  - echo 2008 LVEF 60%, mild AI  - history of ASA allergy, has not been taking   - denies any chest pain. Has had some SOB and orthopnea,. Has had some recent LE edema.   2. HTN  - compliant with meds   3. Hyperlipidemia  - compliant statin - last lipid panel 09/2014 TC 126 TG 96 HDL 44 LDL 63  4. LE edema - last visit we increased lasix to 60mg , symptoms not imrpoved.  Past Medical History:  Diagnosis Date  . Benign essential tremor   . Cough    Prolonged, possible asthmatic bronchitis  . Degenerative joint disease    versus rheumatoid arthritis  . Hyperlipidemia   . Hypertension   . Macular degeneration   . MI, acute, non ST segment elevation (HCC) 1997   Questionable lesion in the circumflex at catheterization; normal EF; chronic dyspnea without specific cause identified  . Osteopenia    Versus osteoporosis  . Postmenopausal    Estrogen replacement therapy  . Repeated falls      Allergies  Allergen Reactions  . Aspirin     GI symptoms  . Bee Venom   . Morphine And Related   . Sulfa Antibiotics     dyspnea  . Tetracyclines & Related   . Codeine Rash  . Pamelor Rash  . Penicillins Rash     Current Outpatient Prescriptions  Medication Sig Dispense Refill  . acetaminophen (TYLENOL) 500 MG tablet Take 500 mg by mouth every 6 (six) hours as needed.    albuterol (ACCUNEB) 1.25 MG/3ML nebulizer solution Take 1 ampule by nebulization every 4 (four) hours as needed for wheezing.    [name] ALPRAZolam (XANAX) 0.25 MG tablet Take 0.25 mg by mouth 2 (two) times daily. For anxiety    . alum & mag hydroxide-simeth (MYLANTA) 200-200-20 MG/5ML suspension Take 15 mLs by mouth at bedtime.    . calcium-vitamin D (OSCAL WITH D) 500-200 MG-UNIT tablet Take 1 tablet by mouth daily.     [name] dextromethorphan (DELSYM) 30 MG/5ML liquid Take 60 mg by mouth 2 (two) times daily as needed. For  cough    . fluticasone (FLONASE) 50 MCG/ACT nasal spray Place 2 sprays into the nose daily.      . furosemide (LASIX) 20 MG tablet Take 3 tablets (60 mg total) by mouth daily. 90 tablet 3  . guaifenesin (HUMIBID E) 400 MG TABS tablet Take 600 mg by mouth 2 (two) times daily.     [name] guar gum powder Take 12 g by mouth 2 (two) times daily. In 8 oz of water    . hydrocortisone (ANUSOL-HC) 2.5 % rectal cream Place 1 application rectally as needed for hemorrhoids or itching.    [name] KLOR-CON M20 20 MEQ tablet Take 1 tablet by mouth daily.    [name] loratadine (CLARITIN) 10 MG tablet Take 10 mg by mouth daily.    [name] losartan (COZAAR) 25 MG tablet Take 1 tablet by mouth daily.    . meloxicam (MOBIC) 15 MG tablet Take 15 mg by mouth daily.    . Multiple Vitamin (MULTIVITAMIN) tablet Take 1 tablet by mouth daily.      . nitroGLYCERIN (NITROSTAT) 0.4 MG SL tablet Place 0.4 mg under the tongue as directed.      [name]  omeprazole (PRILOSEC) 20 MG capsule Take 1 capsule by mouth daily.    Bertram Gala Glycol-Propyl Glycol (SYSTANE) 0.4-0.3 % SOLN Apply 1 drop to eye 3 (three) times daily.      . polyethylene glycol powder (GLYCOLAX/MIRALAX) powder Take 17 g by mouth daily.    . pravastatin (PRAVACHOL) 40 MG tablet Take 40 mg by mouth at bedtime.      . promethazine (PHENERGAN) 25 MG tablet Take 12.5 mg by mouth every 4 (four) hours as needed. For nausea    . SPIRIVA HANDIHALER 18 MCG inhalation capsule Place 1 puff into inhaler and inhale at bedtime.    . SYMBICORT 160-4.5 MCG/ACT inhaler Inhale 2 Inhalers into the lungs 2 (two) times daily.    . traMADol (ULTRAM) 50 MG tablet Take 50 mg by mouth every 6 (six) hours as needed. For pain    . venlafaxine XR (EFFEXOR-XR) 150 MG 24 hr capsule Take 1 capsule by mouth every morning.      No current facility-administered medications for this visit.      Past Surgical  History:  Procedure Laterality Date  . BLADDER SUSPENSION    . ERCP  04/18/2012   Procedure: ENDOSCOPIC RETROGRADE CHOLANGIOPANCREATOGRAPHY (ERCP);  Surgeon: Malissa Hippo, MD;  Location: [facility];  Service: Endoscopy;  Laterality: N/A;  Diagnostic  . FOOT SURGERY     Bilateral  . INGUINAL HERNIA REPAIR     Left     Allergies  Allergen Reactions  . Aspirin     GI symptoms  . Bee Venom   . Morphine And Related   . Sulfa Antibiotics     dyspnea  . Tetracyclines & Related   . Codeine Rash  . Pamelor Rash  . Penicillins Rash      Family History  Problem Relation Age of Onset  . Coronary artery disease    . Stroke       Social History Candice Meza reports that she has never smoked. She has never used smokeless tobacco. Candice Meza reports that she does not drink alcohol.   Review of Systems CONSTITUTIONAL: No weight loss, fever, chills, weakness or fatigue.  HEENT: Eyes: No visual loss, blurred vision, double vision or yellow sclerae.No hearing loss, sneezing, congestion, runny nose or sore throat.  SKIN: No rash or itching.  CARDIOVASCULAR: per HPI RESPIRATORY: per HPI  GASTROINTESTINAL: No anorexia, nausea, vomiting or diarrhea. No abdominal pain or blood.  GENITOURINARY: No burning on urination, no polyuria NEUROLOGICAL: No headache, dizziness, syncope, paralysis, ataxia, numbness or tingling in the extremities. No change in bowel or bladder control.  MUSCULOSKELETAL: No muscle, back pain, joint pain or stiffness.  LYMPHATICS: No enlarged nodes. No history of splenectomy.  PSYCHIATRIC: No history of depression or anxiety.  ENDOCRINOLOGIC: No reports of sweating, cold or heat intolerance. No polyuria or polydipsia.  [name]   Physical Examination Vitals:   02/24/16 0851  BP: 106/62  Pulse: 74   Vitals:   02/24/16 0851  Weight: 107 lb (48.5 kg)  Height: 4\' 11"  (1.499 m)    Gen: resting comfortably, no acute distress HEENT: no scleral icterus, pupils  equal round and reactive, no palptable cervical adenopathy,  CV: RRR, 2/6 systolic mumrur ruSB, no jvd Resp: Clear to auscultation bilaterally GI: abdomen is soft, non-tender, non-distended, normal bowel sounds, no hepatosplenomegaly MSK: extremities are warm, no edema.  Skin: warm, no rash Neuro:  no focal deficits Psych: appropriate affect   Diagnostic Studies 10/29/13 Echo Study Conclusions  - Left  ventricle: The cavity size was normal. Wall thickness was increased in a pattern of mild LVH. Systolic function was normal. The estimated ejection fraction was in the range of 55% to 60%. Wall motion was normal; there were no regional wall motion abnormalities. There was an increased relative contribution of atrial contraction to ventricular filling. Doppler parameters are consistent with abnormal left ventricular relaxation (grade 1 diastolic dysfunction). - Aortic valve: Mildly calcified annulus. Trileaflet; mildly thickened, mildly calcified leaflets. Moderate, centralregurgitation. Minimal aortic stenosis. Peak velocity: 205cm/s (S). Mean gradient: 8mm Hg (S). Valve area: 1.85cm^2(VTI). - Mitral valve: Calcified annulus. Mildly thickened leaflets . Mild regurgitation. - Left atrium: The atrium was mildly dilated. - Tricuspid valve: Mildly thickened leaflets. Mild-moderate regurgitation. - Pulmonary arteries: PA peak pressure: 41mm Hg (S). Mild to moderately elevated pulmonary pressures.     Assessment and Plan   1. CAD  - no current chest pain. - we will continue current meds  2. HTN  - at goal, continue current meds    3. Hyperlipidemia  - at goal - she will continue statin.  4. LE edema - she will increase lasix to 40mg  bid. Repeat labs in 2 weeks  F/u 4-6 weeks      Antoine PocheJonathan F. Zelma Mazariego, M.D.

## 2016-03-09 DIAGNOSIS — E559 Vitamin D deficiency, unspecified: Secondary | ICD-10-CM | POA: Diagnosis not present

## 2016-03-09 DIAGNOSIS — D529 Folate deficiency anemia, unspecified: Secondary | ICD-10-CM | POA: Diagnosis not present

## 2016-03-09 DIAGNOSIS — E039 Hypothyroidism, unspecified: Secondary | ICD-10-CM | POA: Diagnosis not present

## 2016-03-09 DIAGNOSIS — E8809 Other disorders of plasma-protein metabolism, not elsewhere classified: Secondary | ICD-10-CM | POA: Diagnosis not present

## 2016-03-09 DIAGNOSIS — I10 Essential (primary) hypertension: Secondary | ICD-10-CM | POA: Diagnosis not present

## 2016-03-09 DIAGNOSIS — D519 Vitamin B12 deficiency anemia, unspecified: Secondary | ICD-10-CM | POA: Diagnosis not present

## 2016-03-09 DIAGNOSIS — E785 Hyperlipidemia, unspecified: Secondary | ICD-10-CM | POA: Diagnosis not present

## 2016-03-09 DIAGNOSIS — R740 Nonspecific elevation of levels of transaminase and lactic acid dehydrogenase [LDH]: Secondary | ICD-10-CM | POA: Diagnosis not present

## 2016-03-14 ENCOUNTER — Emergency Department (HOSPITAL_COMMUNITY): Payer: Medicare Other

## 2016-03-14 ENCOUNTER — Encounter (HOSPITAL_COMMUNITY): Payer: Self-pay

## 2016-03-14 ENCOUNTER — Inpatient Hospital Stay (HOSPITAL_COMMUNITY)
Admission: EM | Admit: 2016-03-14 | Discharge: 2016-03-17 | DRG: 190 | Disposition: A | Discharge status: Skilled Nursing Facility | Payer: Medicare Other | Attending: Internal Medicine | Admitting: Internal Medicine

## 2016-03-14 DIAGNOSIS — R278 Other lack of coordination: Secondary | ICD-10-CM | POA: Diagnosis not present

## 2016-03-14 DIAGNOSIS — R279 Unspecified lack of coordination: Secondary | ICD-10-CM | POA: Diagnosis not present

## 2016-03-14 DIAGNOSIS — N390 Urinary tract infection, site not specified: Secondary | ICD-10-CM | POA: Diagnosis present

## 2016-03-14 DIAGNOSIS — D72829 Elevated white blood cell count, unspecified: Secondary | ICD-10-CM | POA: Diagnosis present

## 2016-03-14 DIAGNOSIS — Z880 Allergy status to penicillin: Secondary | ICD-10-CM

## 2016-03-14 DIAGNOSIS — I10 Essential (primary) hypertension: Secondary | ICD-10-CM | POA: Diagnosis not present

## 2016-03-14 DIAGNOSIS — Z8249 Family history of ischemic heart disease and other diseases of the circulatory system: Secondary | ICD-10-CM

## 2016-03-14 DIAGNOSIS — E785 Hyperlipidemia, unspecified: Secondary | ICD-10-CM | POA: Diagnosis present

## 2016-03-14 DIAGNOSIS — R531 Weakness: Secondary | ICD-10-CM | POA: Diagnosis not present

## 2016-03-14 DIAGNOSIS — M069 Rheumatoid arthritis, unspecified: Secondary | ICD-10-CM | POA: Diagnosis present

## 2016-03-14 DIAGNOSIS — I4891 Unspecified atrial fibrillation: Secondary | ICD-10-CM | POA: Diagnosis present

## 2016-03-14 DIAGNOSIS — I252 Old myocardial infarction: Secondary | ICD-10-CM | POA: Diagnosis not present

## 2016-03-14 DIAGNOSIS — J440 Chronic obstructive pulmonary disease with acute lower respiratory infection: Principal | ICD-10-CM | POA: Diagnosis present

## 2016-03-14 DIAGNOSIS — K219 Gastro-esophageal reflux disease without esophagitis: Secondary | ICD-10-CM | POA: Diagnosis present

## 2016-03-14 DIAGNOSIS — F419 Anxiety disorder, unspecified: Secondary | ICD-10-CM | POA: Diagnosis not present

## 2016-03-14 DIAGNOSIS — Z7951 Long term (current) use of inhaled steroids: Secondary | ICD-10-CM | POA: Diagnosis not present

## 2016-03-14 DIAGNOSIS — M159 Polyosteoarthritis, unspecified: Secondary | ICD-10-CM | POA: Diagnosis not present

## 2016-03-14 DIAGNOSIS — H3530 Unspecified macular degeneration: Secondary | ICD-10-CM | POA: Diagnosis present

## 2016-03-14 DIAGNOSIS — J9601 Acute respiratory failure with hypoxia: Secondary | ICD-10-CM | POA: Diagnosis present

## 2016-03-14 DIAGNOSIS — K839 Disease of biliary tract, unspecified: Secondary | ICD-10-CM | POA: Diagnosis not present

## 2016-03-14 DIAGNOSIS — I259 Chronic ischemic heart disease, unspecified: Secondary | ICD-10-CM | POA: Diagnosis not present

## 2016-03-14 DIAGNOSIS — R1314 Dysphagia, pharyngoesophageal phase: Secondary | ICD-10-CM | POA: Diagnosis not present

## 2016-03-14 DIAGNOSIS — E784 Other hyperlipidemia: Secondary | ICD-10-CM | POA: Diagnosis not present

## 2016-03-14 DIAGNOSIS — R2689 Other abnormalities of gait and mobility: Secondary | ICD-10-CM | POA: Diagnosis not present

## 2016-03-14 DIAGNOSIS — Z823 Family history of stroke: Secondary | ICD-10-CM

## 2016-03-14 DIAGNOSIS — I214 Non-ST elevation (NSTEMI) myocardial infarction: Secondary | ICD-10-CM | POA: Diagnosis not present

## 2016-03-14 DIAGNOSIS — Y95 Nosocomial condition: Secondary | ICD-10-CM | POA: Diagnosis present

## 2016-03-14 DIAGNOSIS — J449 Chronic obstructive pulmonary disease, unspecified: Secondary | ICD-10-CM | POA: Diagnosis not present

## 2016-03-14 DIAGNOSIS — I2510 Atherosclerotic heart disease of native coronary artery without angina pectoris: Secondary | ICD-10-CM | POA: Diagnosis present

## 2016-03-14 DIAGNOSIS — F329 Major depressive disorder, single episode, unspecified: Secondary | ICD-10-CM | POA: Diagnosis not present

## 2016-03-14 DIAGNOSIS — A419 Sepsis, unspecified organism: Secondary | ICD-10-CM

## 2016-03-14 DIAGNOSIS — H548 Legal blindness, as defined in USA: Secondary | ICD-10-CM | POA: Diagnosis not present

## 2016-03-14 DIAGNOSIS — W010XXD Fall on same level from slipping, tripping and stumbling without subsequent striking against object, subsequent encounter: Secondary | ICD-10-CM | POA: Diagnosis not present

## 2016-03-14 DIAGNOSIS — K210 Gastro-esophageal reflux disease with esophagitis: Secondary | ICD-10-CM | POA: Diagnosis not present

## 2016-03-14 DIAGNOSIS — Z993 Dependence on wheelchair: Secondary | ICD-10-CM | POA: Diagnosis not present

## 2016-03-14 DIAGNOSIS — R05 Cough: Secondary | ICD-10-CM | POA: Diagnosis not present

## 2016-03-14 DIAGNOSIS — E876 Hypokalemia: Secondary | ICD-10-CM | POA: Diagnosis present

## 2016-03-14 DIAGNOSIS — J189 Pneumonia, unspecified organism: Secondary | ICD-10-CM | POA: Diagnosis not present

## 2016-03-14 DIAGNOSIS — M1990 Unspecified osteoarthritis, unspecified site: Secondary | ICD-10-CM | POA: Diagnosis present

## 2016-03-14 DIAGNOSIS — R0602 Shortness of breath: Secondary | ICD-10-CM | POA: Diagnosis not present

## 2016-03-14 DIAGNOSIS — R260 Ataxic gait: Secondary | ICD-10-CM | POA: Diagnosis not present

## 2016-03-14 DIAGNOSIS — R509 Fever, unspecified: Secondary | ICD-10-CM | POA: Diagnosis not present

## 2016-03-14 HISTORY — DX: Anxiety disorder, unspecified: F41.9

## 2016-03-14 HISTORY — DX: Hypokalemia: E87.6

## 2016-03-14 HISTORY — DX: Major depressive disorder, single episode, unspecified: F32.9

## 2016-03-14 HISTORY — DX: Ataxic gait: R26.0

## 2016-03-14 HISTORY — DX: Weakness: R53.1

## 2016-03-14 HISTORY — DX: Chronic obstructive pulmonary disease, unspecified: J44.9

## 2016-03-14 HISTORY — DX: Unspecified atrial fibrillation: I48.91

## 2016-03-14 HISTORY — DX: Dysphagia, unspecified: R13.10

## 2016-03-14 HISTORY — DX: Gastro-esophageal reflux disease without esophagitis: K21.9

## 2016-03-14 HISTORY — DX: Depression, unspecified: F32.A

## 2016-03-14 HISTORY — DX: Atherosclerotic heart disease of native coronary artery without angina pectoris: I25.10

## 2016-03-14 HISTORY — DX: Disease of biliary tract, unspecified: K83.9

## 2016-03-14 HISTORY — DX: Angina pectoris, unspecified: I20.9

## 2016-03-14 LAB — URINALYSIS, ROUTINE W REFLEX MICROSCOPIC
BILIRUBIN URINE: NEGATIVE
Glucose, UA: NEGATIVE mg/dL
Hgb urine dipstick: NEGATIVE
Ketones, ur: NEGATIVE mg/dL
NITRITE: NEGATIVE
PH: 7 (ref 5.0–8.0)
Protein, ur: NEGATIVE mg/dL
SPECIFIC GRAVITY, URINE: 1.01 (ref 1.005–1.030)

## 2016-03-14 LAB — CBC WITH DIFFERENTIAL/PLATELET
BASOS ABS: 0 10*3/uL (ref 0.0–0.1)
Basophils Relative: 0 %
EOS ABS: 0 10*3/uL (ref 0.0–0.7)
Eosinophils Relative: 0 %
HCT: 40.5 % (ref 36.0–46.0)
Hemoglobin: 13 g/dL (ref 12.0–15.0)
LYMPHS ABS: 1 10*3/uL (ref 0.7–4.0)
Lymphocytes Relative: 4 %
MCH: 32.2 pg (ref 26.0–34.0)
MCHC: 32.1 g/dL (ref 30.0–36.0)
MCV: 100.2 fL — ABNORMAL HIGH (ref 78.0–100.0)
MONO ABS: 1 10*3/uL (ref 0.1–1.0)
MONOS PCT: 4 %
NEUTROS ABS: 22.3 10*3/uL — AB (ref 1.7–7.7)
Neutrophils Relative %: 92 %
PLATELETS: 327 10*3/uL (ref 150–400)
RBC: 4.04 MIL/uL (ref 3.87–5.11)
RDW: 13.5 % (ref 11.5–15.5)
WBC Morphology: INCREASED
WBC: 24.3 10*3/uL — AB (ref 4.0–10.5)

## 2016-03-14 LAB — COMPREHENSIVE METABOLIC PANEL
ALBUMIN: 4 g/dL (ref 3.5–5.0)
ALK PHOS: 55 U/L (ref 38–126)
ALT: 14 U/L (ref 14–54)
AST: 22 U/L (ref 15–41)
Anion gap: 13 (ref 5–15)
BILIRUBIN TOTAL: 0.5 mg/dL (ref 0.3–1.2)
BUN: 24 mg/dL — AB (ref 6–20)
CALCIUM: 9.1 mg/dL (ref 8.9–10.3)
CO2: 32 mmol/L (ref 22–32)
Chloride: 95 mmol/L — ABNORMAL LOW (ref 101–111)
Creatinine, Ser: 0.49 mg/dL (ref 0.44–1.00)
GFR calc Af Amer: >60 mL/min (ref >60)
GLUCOSE: 113 mg/dL — AB (ref 65–99)
POTASSIUM: 3.3 mmol/L — AB (ref 3.5–5.1)
Sodium: 140 mmol/L (ref 135–145)
TOTAL PROTEIN: 7.1 g/dL (ref 6.5–8.1)

## 2016-03-14 LAB — CBC
HEMATOCRIT: 33.6 % — AB (ref 36.0–46.0)
HEMOGLOBIN: 10.9 g/dL — AB (ref 12.0–15.0)
MCH: 32.4 pg (ref 26.0–34.0)
MCHC: 32.4 g/dL (ref 30.0–36.0)
MCV: 100 fL (ref 78.0–100.0)
Platelets: 266 10*3/uL (ref 150–400)
RBC: 3.36 MIL/uL — AB (ref 3.87–5.11)
RDW: 13.7 % (ref 11.5–15.5)
WBC: 17.3 10*3/uL — ABNORMAL HIGH (ref 4.0–10.5)

## 2016-03-14 LAB — CREATININE, SERUM
Creatinine, Ser: 0.37 mg/dL — ABNORMAL LOW (ref 0.44–1.00)
GFR calc Af Amer: >60 mL/min (ref >60)
GFR calc non Af Amer: >60 mL/min (ref >60)

## 2016-03-14 LAB — LACTIC ACID, PLASMA
LACTIC ACID, VENOUS: 1.7 mmol/L (ref 0.5–1.9)
LACTIC ACID, VENOUS: 1.4 mmol/L (ref 0.5–1.9)

## 2016-03-14 LAB — MAGNESIUM: MAGNESIUM: 2.2 mg/dL (ref 1.7–2.4)

## 2016-03-14 LAB — TSH: TSH: 2.811 u[IU]/mL (ref 0.350–4.500)

## 2016-03-14 LAB — URINE MICROSCOPIC-ADD ON: RBC / HPF: NONE SEEN RBC/hpf (ref 0–5)

## 2016-03-14 LAB — PROTIME-INR
INR: 0.95
Prothrombin Time: 12.7 seconds (ref 11.4–15.2)

## 2016-03-14 LAB — TROPONIN I: Troponin I: 0.04 ng/mL (ref <0.03)

## 2016-03-14 LAB — BRAIN NATRIURETIC PEPTIDE: B NATRIURETIC PEPTIDE 5: 50 pg/mL (ref 0.0–100.0)

## 2016-03-14 MED ORDER — SODIUM CHLORIDE 0.9 % IV BOLUS (SEPSIS)
1000.0000 mL | Freq: Once | INTRAVENOUS | Status: AC
  Administered 2016-03-14: 1000 mL via INTRAVENOUS

## 2016-03-14 MED ORDER — TRAMADOL HCL 50 MG PO TABS
50.0000 mg | ORAL_TABLET | Freq: Four times a day (QID) | ORAL | Status: DC | PRN
  Administered 2016-03-16: 50 mg via ORAL
  Filled 2016-03-14: qty 1

## 2016-03-14 MED ORDER — SODIUM CHLORIDE 0.9% FLUSH: 3.0000 mL | INTRAVENOUS | Status: DC | PRN

## 2016-03-14 MED ORDER — MOMETASONE FURO-FORMOTEROL FUM 200-5 MCG/ACT IN AERO
INHALATION_SPRAY | RESPIRATORY_TRACT | Status: AC
  Filled 2016-03-14: qty 8.8

## 2016-03-14 MED ORDER — SODIUM CHLORIDE 0.9 % IV SOLN: 250.0000 mL | INTRAVENOUS | Status: DC | PRN

## 2016-03-14 MED ORDER — IBUPROFEN 400 MG PO TABS
ORAL_TABLET | ORAL | Status: AC
  Administered 2016-03-14: 600 mg
  Filled 2016-03-14: qty 2

## 2016-03-14 MED ORDER — ACETAMINOPHEN 650 MG RE SUPP: 650.0000 mg | Freq: Four times a day (QID) | RECTAL | Status: DC | PRN

## 2016-03-14 MED ORDER — POLYETHYLENE GLYCOL 3350 17 G PO PACK
17.0000 g | PACK | Freq: Every day | ORAL | Status: DC
  Administered 2016-03-15 – 2016-03-17 (×3): 17 g via ORAL
  Filled 2016-03-14 (×3): qty 1

## 2016-03-14 MED ORDER — ACETAMINOPHEN 325 MG PO TABS: 650.0000 mg | ORAL_TABLET | Freq: Four times a day (QID) | ORAL | Status: DC | PRN

## 2016-03-14 MED ORDER — VENLAFAXINE HCL ER 75 MG PO CP24
75.0000 mg | ORAL_CAPSULE | Freq: Every day | ORAL | Status: DC
  Administered 2016-03-15 – 2016-03-17 (×3): 75 mg via ORAL
  Filled 2016-03-14 (×3): qty 1

## 2016-03-14 MED ORDER — ADULT MULTIVITAMIN W/MINERALS CH
1.0000 | ORAL_TABLET | Freq: Every day | ORAL | Status: DC
  Administered 2016-03-15 – 2016-03-17 (×3): 1 via ORAL
  Filled 2016-03-14 (×3): qty 1

## 2016-03-14 MED ORDER — NITROGLYCERIN 0.4 MG SL SUBL: 0.4000 mg | SUBLINGUAL_TABLET | SUBLINGUAL | Status: DC

## 2016-03-14 MED ORDER — GUAIFENESIN ER 600 MG PO TB12
600.0000 mg | ORAL_TABLET | Freq: Two times a day (BID) | ORAL | Status: DC
  Administered 2016-03-14 – 2016-03-17 (×6): 600 mg via ORAL
  Filled 2016-03-14 (×6): qty 1

## 2016-03-14 MED ORDER — SENNOSIDES-DOCUSATE SODIUM 8.6-50 MG PO TABS: 1.0000 | ORAL_TABLET | Freq: Every evening | ORAL | Status: DC | PRN

## 2016-03-14 MED ORDER — PANTOPRAZOLE SODIUM 40 MG PO TBEC
40.0000 mg | DELAYED_RELEASE_TABLET | Freq: Every day | ORAL | Status: DC
Start: 2016-03-14 — End: 2016-03-17
  Administered 2016-03-15 – 2016-03-17 (×3): 40 mg via ORAL
  Filled 2016-03-14 (×3): qty 1

## 2016-03-14 MED ORDER — ONDANSETRON HCL 4 MG/2ML IJ SOLN: 4.0000 mg | Freq: Four times a day (QID) | INTRAMUSCULAR | Status: DC | PRN

## 2016-03-14 MED ORDER — VANCOMYCIN HCL IN DEXTROSE 750-5 MG/150ML-% IV SOLN
750.0000 mg | INTRAVENOUS | Status: DC
  Filled 2016-03-14: qty 150

## 2016-03-14 MED ORDER — POTASSIUM CHLORIDE CRYS ER 20 MEQ PO TBCR
40.0000 meq | EXTENDED_RELEASE_TABLET | Freq: Once | ORAL | Status: AC
  Administered 2016-03-14: 40 meq via ORAL
  Filled 2016-03-14: qty 2

## 2016-03-14 MED ORDER — MOMETASONE FURO-FORMOTEROL FUM 200-5 MCG/ACT IN AERO
2.0000 | INHALATION_SPRAY | Freq: Two times a day (BID) | RESPIRATORY_TRACT | Status: DC
  Administered 2016-03-14 – 2016-03-17 (×6): 2 via RESPIRATORY_TRACT
  Filled 2016-03-14: qty 8.8

## 2016-03-14 MED ORDER — DEXTROSE 5 % IV SOLN
1.0000 g | Freq: Three times a day (TID) | INTRAVENOUS | Status: DC
  Administered 2016-03-14: 1 g via INTRAVENOUS
  Filled 2016-03-14 (×3): qty 1

## 2016-03-14 MED ORDER — VANCOMYCIN HCL IN DEXTROSE 1-5 GM/200ML-% IV SOLN
1000.0000 mg | Freq: Once | INTRAVENOUS | Status: AC
  Administered 2016-03-14: 1000 mg via INTRAVENOUS
  Filled 2016-03-14: qty 200

## 2016-03-14 MED ORDER — POLYETHYLENE GLYCOL 3350 17 GM/SCOOP PO POWD: 17.0000 g | Freq: Every day | ORAL | Status: DC

## 2016-03-14 MED ORDER — DEXTROSE 5 % IV SOLN
1.0000 g | Freq: Three times a day (TID) | INTRAVENOUS | Status: DC
  Administered 2016-03-15 – 2016-03-17 (×8): 1 g via INTRAVENOUS
  Filled 2016-03-14 (×11): qty 1

## 2016-03-14 MED ORDER — DEXTROSE 5 % IV SOLN
2.0000 g | Freq: Once | INTRAVENOUS | Status: AC
  Administered 2016-03-14: 2 g via INTRAVENOUS
  Filled 2016-03-14: qty 2

## 2016-03-14 MED ORDER — ENOXAPARIN SODIUM 40 MG/0.4ML ~~LOC~~ SOLN
40.0000 mg | SUBCUTANEOUS | Status: DC
  Administered 2016-03-14 – 2016-03-16 (×3): 40 mg via SUBCUTANEOUS
  Filled 2016-03-14 (×3): qty 0.4

## 2016-03-14 MED ORDER — ALPRAZOLAM 0.25 MG PO TABS
0.2500 mg | ORAL_TABLET | Freq: Two times a day (BID) | ORAL | Status: DC
  Administered 2016-03-14 – 2016-03-17 (×6): 0.25 mg via ORAL
  Filled 2016-03-14 (×6): qty 1

## 2016-03-14 MED ORDER — SODIUM CHLORIDE 0.9 % IV SOLN: INTRAVENOUS | Status: DC

## 2016-03-14 MED ORDER — HYDROCORTISONE 2.5 % RE CREA
1.0000 "application " | TOPICAL_CREAM | Freq: Three times a day (TID) | RECTAL | Status: DC | PRN
  Filled 2016-03-14: qty 28.35

## 2016-03-14 MED ORDER — SODIUM CHLORIDE 0.9% FLUSH
3.0000 mL | Freq: Two times a day (BID) | INTRAVENOUS | Status: DC
  Administered 2016-03-15 – 2016-03-16 (×4): 3 mL via INTRAVENOUS

## 2016-03-14 MED ORDER — LORATADINE 10 MG PO TABS
10.0000 mg | ORAL_TABLET | Freq: Every day | ORAL | Status: DC
  Administered 2016-03-15 – 2016-03-17 (×3): 10 mg via ORAL
  Filled 2016-03-14 (×3): qty 1

## 2016-03-14 MED ORDER — MELOXICAM 7.5 MG PO TABS
15.0000 mg | ORAL_TABLET | Freq: Every day | ORAL | Status: DC
  Administered 2016-03-15 – 2016-03-17 (×3): 15 mg via ORAL
  Filled 2016-03-14: qty 1
  Filled 2016-03-14: qty 2
  Filled 2016-03-14: qty 1
  Filled 2016-03-14 (×2): qty 2
  Filled 2016-03-14: qty 1

## 2016-03-14 MED ORDER — PRAVASTATIN SODIUM 40 MG PO TABS
40.0000 mg | ORAL_TABLET | Freq: Every day | ORAL | Status: DC
  Administered 2016-03-14 – 2016-03-16 (×3): 40 mg via ORAL
  Filled 2016-03-14 (×3): qty 1

## 2016-03-14 MED ORDER — DEXTROSE 5 % IV SOLN: 2.0000 g | Freq: Three times a day (TID) | INTRAVENOUS | Status: DC

## 2016-03-14 MED ORDER — ONDANSETRON HCL 4 MG PO TABS: 4.0000 mg | ORAL_TABLET | Freq: Four times a day (QID) | ORAL | Status: DC | PRN

## 2016-03-14 MED ORDER — POLYVINYL ALCOHOL 1.4 % OP SOLN
1.0000 [drp] | Freq: Three times a day (TID) | OPHTHALMIC | Status: DC
  Administered 2016-03-15 – 2016-03-17 (×7): 1 [drp] via OPHTHALMIC
  Filled 2016-03-14: qty 15

## 2016-03-14 MED ORDER — ASPIRIN 81 MG PO CHEW
324.0000 mg | CHEWABLE_TABLET | Freq: Once | ORAL | Status: AC
  Administered 2016-03-14: 324 mg via ORAL
  Filled 2016-03-14: qty 4

## 2016-03-14 MED ORDER — BENZONATATE 100 MG PO CAPS
200.0000 mg | ORAL_CAPSULE | Freq: Three times a day (TID) | ORAL | Status: DC | PRN
Start: 2016-03-14 — End: 2016-03-17

## 2016-03-14 NOTE — ED Triage Notes (Signed)
Pt sent from [facility] in [place].  Reports she c/o feeling SOB and was coughing at 1230 last night so they gave her tylenol, breathing treatment, and put on on 02  At 2 liters because 02 sat was 88% on room air.  Pt alert, oriented.  Staff reports pt's cough was productive with thick sputum.  Pt says her throat is a little sore.  Fever was 102 this morning so she had tylenol 500mg  at 6 am.

## 2016-03-14 NOTE — ED Notes (Signed)
Pt taken to xray. Nad.  

## 2016-03-14 NOTE — ED Notes (Signed)
Report given to floor Rn, lab at the bedside, pharmacy reviewing med list

## 2016-03-14 NOTE — ED Notes (Signed)
Called nursing center to bring pt teeth. And update them on pt admission

## 2016-03-14 NOTE — ED Notes (Signed)
Pt returned from xray

## 2016-03-14 NOTE — ED Provider Notes (Signed)
[facility] Provider Note   CSN: [id number] Arrival date & time: 03/14/16  7858  By signing my name below, I, Majel Homer, attest that this documentation has been prepared under the direction and in the presence of Glynn Octave, MD . Electronically Signed: Majel Homer, Scribe. 03/14/2016. 8:57 AM.  History   Chief Complaint Chief Complaint  Patient presents with  . Shortness of Breath   The history is provided by the patient. No language interpreter was used.   HPI Comments: Candice Meza is a 82 y.o. female with PMHx of HTN, HLD, COPD, MI, and macular degeneration, who presents to the Emergency Department from the [facility] complaining of gradually worsening, shortness of breath that began last night and worsened today. Pt reports associated productive cough with thick, green sputum and persistent, chest pain across her bilateral chest that also began last night. She denies chest pain now in the ED. Per nurses at her facility, pt was given tylenol, breathing treatment and placed on 2L O2 due to her O2 being 88% on RA last night. Pt reports associated nausea and fever (TMAX 103.2) now in the ED. She states she was given tylenol 500mg  at 6 AM this morning before visiting the ED. She denies hx of smoking and use of blood thinners. Pt reports she does not walk at all but uses a wheelchair.   Past Medical History:  Diagnosis Date  . Benign essential tremor   . Cough    Prolonged, possible asthmatic bronchitis  . Degenerative joint disease    versus rheumatoid arthritis  . Hyperlipidemia   . Hypertension   . Macular degeneration   . MI, acute, non ST segment elevation (HCC) 1997   Questionable lesion in the circumflex at catheterization; normal EF; chronic dyspnea without specific cause identified  . Osteopenia    Versus osteoporosis  . Postmenopausal    Estrogen replacement therapy  . Repeated falls     Patient Active Problem List   Diagnosis Date Noted  . Nausea  and vomiting in adult 04/18/2012  . Abdominal pain 04/18/2012  . Transaminitis 04/18/2012  . UTI (lower urinary tract infection) 04/18/2012  . CAD (coronary artery disease) 04/18/2012  . Anemia 06/24/2011  . Repeated falls   . MI, acute, non ST segment elevation (HCC)   . Macular degeneration   . Asthmatic bronchitis , chronic (HCC) 03/24/2011  . BILIARY TRACT DISORDER 05/10/2010  . Essential hypertension 05/04/2009  . HYPERTENSION 05/04/2009  . Gastroesophageal reflux disease 05/04/2009  . DEGENERATIVE JOINT DISEASE 05/04/2009  . OSTEOPENIA 05/04/2009    Past Surgical History:  Procedure Laterality Date  . BLADDER SUSPENSION    . ERCP  04/18/2012   Procedure: ENDOSCOPIC RETROGRADE CHOLANGIOPANCREATOGRAPHY (ERCP);  Surgeon: 04/20/2012, MD;  Location: [facility];  Service: Endoscopy;  Laterality: N/A;  Diagnostic  . FOOT SURGERY     Bilateral  . INGUINAL HERNIA REPAIR     Left    OB History    No data available     Home Medications    Prior to Admission medications   Medication Sig Start Date End Date Taking? Authorizing Provider  acetaminophen (TYLENOL) 500 MG tablet Take 500 mg by mouth every 6 (six) hours as needed.    Historical Provider, MD  albuterol (ACCUNEB) 1.25 MG/3ML nebulizer solution Take 1 ampule by nebulization every 4 (four) hours as needed for wheezing.    Historical Provider, MD  ALPRAZolam Malissa Hippo) 0.25 MG tablet Take 0.25 mg by mouth 2 (  two) times daily. For anxiety    Historical Provider, MD  alum & mag hydroxide-simeth (MYLANTA) 200-200-20 MG/5ML suspension Take 15 mLs by mouth at bedtime.    Historical Provider, MD  benzonatate (TESSALON) 200 MG capsule Take 200 mg by mouth 3 (three) times daily as needed for cough.    Historical Provider, MD  calcium-vitamin D (OSCAL WITH D) 500-200 MG-UNIT tablet Take 1 tablet by mouth daily.    Historical Provider, MD  dextromethorphan (DELSYM) 30 MG/5ML liquid Take 60 mg by mouth 2 (two) times daily as needed. For   cough    Historical Provider, MD  fluticasone (FLONASE) 50 MCG/ACT nasal spray Place 2 sprays into the nose daily.      Historical Provider, MD  furosemide (LASIX) 40 MG tablet Take 1 tablet (40 mg total) by mouth 2 (two) times daily. 02/24/16 05/24/16  Antoine Poche, MD  guaifenesin (HUMIBID E) 400 MG TABS tablet Take 600 mg by mouth 2 (two) times daily.     Historical Provider, MD  guar gum powder Take 12 g by mouth 2 (two) times daily. In 8 oz of water    Historical Provider, MD  hydrocortisone (ANUSOL-HC) 2.5 % rectal cream Place 1 application rectally as needed for hemorrhoids or itching.    Historical Provider, MD  INCRUSE ELLIPTA 62.5 MCG/INH AEPB Inhale 1 Inhaler into the lungs daily. 02/23/16   Historical Provider, MD  KLOR-CON M20 20 MEQ tablet Take 1 tablet by mouth daily. 05/28/14   Historical Provider, MD  loratadine (CLARITIN) 10 MG tablet Take 10 mg by mouth daily.    Historical Provider, MD  losartan (COZAAR) 25 MG tablet Take 1 tablet by mouth daily. 11/26/14   Historical Provider, MD  meloxicam (MOBIC) 15 MG tablet Take 15 mg by mouth daily.    Historical Provider, MD  Multiple Vitamin (MULTIVITAMIN) tablet Take 1 tablet by mouth daily.      Historical Provider, MD  nitroGLYCERIN (NITROSTAT) 0.4 MG SL tablet Place 0.4 mg under the tongue as directed.      Historical Provider, MD  omeprazole (PRILOSEC) 20 MG capsule Take 1 capsule by mouth daily. 06/04/14   Historical Provider, MD  Polyethyl Glycol-Propyl Glycol (SYSTANE) 0.4-0.3 % SOLN Apply 1 drop to eye 3 (three) times daily.      Historical Provider, MD  polyethylene glycol powder (GLYCOLAX/MIRALAX) powder Take 17 g by mouth daily.    Historical Provider, MD  pravastatin (PRAVACHOL) 40 MG tablet Take 40 mg by mouth at bedtime.      Historical Provider, MD  promethazine (PHENERGAN) 25 MG tablet Take 12.5 mg by mouth every 4 (four) hours as needed. For nausea    Historical Provider, MD  SPIRIVA HANDIHALER 18 MCG inhalation  capsule Place 1 puff into inhaler and inhale at bedtime. 11/10/14   Historical Provider, MD  SYMBICORT 160-4.5 MCG/ACT inhaler Inhale 2 Inhalers into the lungs 2 (two) times daily. 06/18/15   Historical Provider, MD  traMADol (ULTRAM) 50 MG tablet Take 50 mg by mouth every 6 (six) hours as needed. For pain    Historical Provider, MD  venlafaxine XR (EFFEXOR-XR) 75 MG 24 hr capsule Take 1 capsule by mouth daily. 02/08/16   Historical Provider, MD    Family History Family History  Problem Relation Age of Onset  . Coronary artery disease    . Stroke      Social History Social History  Substance Use Topics  . Smoking status: Never Smoker  . Smokeless tobacco:  Never Used  . Alcohol use No     Allergies   Aspirin; Bee venom; Morphine and related; Sulfa antibiotics; Tetracyclines & related; Codeine; Pamelor; and Penicillins   Review of Systems Review of Systems 10 systems reviewed and all are negative for acute change except as noted in the HPI.  Physical Exam Updated Vital Signs BP 130/60 (BP Location: Left Arm)   Pulse 91   Temp (!) 103.2 F (39.6 C) (Oral)   Resp 22   Ht 4\' 11"  (1.499 m)   Wt 107 lb (48.5 kg)   SpO2 91%   BMI 21.61 kg/m   Physical Exam  Constitutional: She is oriented to person, place, and time. She appears well-developed and well-nourished. No distress.  HENT:  Head: Normocephalic and atraumatic.  Mouth/Throat: Oropharynx is clear and moist. No oropharyngeal exudate.  Eyes: Conjunctivae and EOM are normal. Pupils are equal, round, and reactive to light.  Neck: Normal range of motion. Neck supple.  No meningismus.  Cardiovascular: Normal rate, regular rhythm, normal heart sounds and intact distal pulses.   No murmur heard. Pulmonary/Chest: Effort normal and breath sounds normal. No respiratory distress.  Speaking in short sentences,  decreased breath sounds left base  Abdominal: Soft. There is no tenderness. There is no rebound and no guarding.    Musculoskeletal: Normal range of motion. She exhibits no edema or tenderness.  Neurological: She is alert and oriented to person, place, and time. No cranial nerve deficit. She exhibits normal muscle tone. Coordination normal.  No ataxia on finger to nose bilaterally. No pronator drift. 5/5 strength throughout. CN 2-12 intact.Equal grip strength. Sensation intact.   Skin: Skin is warm.  Psychiatric: She has a normal mood and affect. Her behavior is normal.  Nursing note and vitals reviewed.  ED Treatments / Results  Labs (all labs ordered are listed, but only abnormal results are displayed) Labs Reviewed  COMPREHENSIVE METABOLIC PANEL - Abnormal; Notable for the following:       Result Value   Potassium 3.3 (*)    Chloride 95 (*)    Glucose, Bld 113 (*)    BUN 24 (*)    All other components within normal limits  CBC WITH DIFFERENTIAL/PLATELET - Abnormal; Notable for the following:    WBC 24.3 (*)    MCV 100.2 (*)    Neutro Abs 22.3 (*)    All other components within normal limits  URINALYSIS, ROUTINE W REFLEX MICROSCOPIC (NOT AT [facility]) - Abnormal; Notable for the following:    APPearance CLOUDY (*)    Leukocytes, UA MODERATE (*)    All other components within normal limits  TROPONIN I - Abnormal; Notable for the following:    Troponin I 0.04 (*)    All other components within normal limits  URINE MICROSCOPIC-ADD ON - Abnormal; Notable for the following:    Squamous Epithelial / LPF 0-5 (*)    Bacteria, UA MANY (*)    All other components within normal limits  CULTURE, BLOOD (ROUTINE X 2)  CULTURE, BLOOD (ROUTINE X 2)  URINE CULTURE  MRSA PCR SCREENING  LACTIC ACID, PLASMA  LACTIC ACID, PLASMA  BRAIN NATRIURETIC PEPTIDE  PROTIME-INR    EKG  EKG Interpretation  Date/Time:  Monday March 14 2016 09:07:28 EDT Ventricular Rate:  93 PR Interval:    QRS Duration: 87 QT Interval:  359 QTC Calculation: 447 R Axis:   90 Text Interpretation:  Sinus rhythm Anteroseptal  infarct, age indeterminate inferior and lateral ST depressions Confirmed  by Manus Gunning  MD, Marshia Tropea [phone]) on 03/14/2016 9:17:26 AM       Radiology Dg Chest 2 View  Result Date: 03/14/2016 CLINICAL DATA:  Cough and fever and shortness of breath EXAM: CHEST  2 VIEW COMPARISON:  07/02/2015 FINDINGS: Aortic atherosclerosis identified. There are decreased lung volumes with asymmetric elevation of the left hemidiaphragm. Atelectasis is noted within both lung bases. IMPRESSION: 1. Low lung volumes. 2. Bibasilar atelectasis. 3. Aortic atherosclerosis. Electronically Signed   By: Signa Kell M.D.   On: 03/14/2016 09:33   Procedures Procedures (including critical care time)  Medications Ordered in ED Medications - No data to display   Initial Impression / Assessment and Plan / ED Course  I have reviewed the triage vital signs and the nursing notes.  Pertinent labs & imaging results that were available during my care of the patient were reviewed by me and considered in my medical decision making (see chart for details).  Clinical Course  DIAGNOSTIC STUDIES:  Oxygen Saturation is 91% on RA, low by my interpretation.    COORDINATION OF CARE:  8:57 AM Discussed treatment plan with pt at bedside and pt agreed to plan. Patient nursing home with one day history of cough, nausea, fever and shortness of breath. Febrile on arrival with new oxygen requirement. Moist cough.  Concern for possible pneumonia. Code sepsis orders followed UA appears infected.  Lactate is normal. White blood cell count is 24. Patient started on broad-spectrum IV antibiotics after cultures obtained and IV fluids given. Echocardiogram shows normal EF 2 years ago.  Chest x-ray read as negative but clinical concern for pneumonia. Patient also with infected-looking UA. Minimal troponin elevation with ST depressions. Denies chest pain or shortness of breath currently.  Suspect sepsis from pneumonia and possible urinary tract  infection. But pressure and mental status remained stable in the ED. Admission discussed with Dr. Ardyth Harps.  CRITICAL CARE Performed by: Glynn Octave Total critical care time: 40 minutes Critical care time was exclusive of separately billable procedures and treating other patients. Critical care was necessary to treat or prevent imminent or life-threatening deterioration. Critical care was time spent personally by me on the following activities: development of treatment plan with patient and/or surrogate as well as nursing, discussions with consultants, evaluation of patient's response to treatment, examination of patient, obtaining history from patient or surrogate, ordering and performing treatments and interventions, ordering and review of laboratory studies, ordering and review of radiographic studies, pulse oximetry and re-evaluation of patient's condition.  I personally performed the services described in this documentation, which was scribed in my presence. The recorded information has been reviewed and is accurate.   Final Clinical Impressions(s) / ED Diagnoses   Final diagnoses:  Sepsis, due to unspecified organism (HCC)  HCAP (healthcare-associated pneumonia)  Urinary tract infection without hematuria, site unspecified    New Prescriptions New Prescriptions   No medications on file     Glynn Octave, MD 03/14/16 1603

## 2016-03-14 NOTE — Progress Notes (Signed)
Pharmacy Antibiotic Note  Candice Meza is a 82 y.o. female admitted on 03/14/2016 with pneumonia.  Pharmacy has been consulted for [facility] AND AZTREONAM dosing.  Plan:  Vancomycin 750mg  IV q24h Check trough at steady state Aztreonam 1gm IV q8h Monitor labs, renal fxn, progress and c/s Deescalate ABX when improved / appropriate.    Height: 4\' 11"  (149.9 cm) Weight: 107 lb 5.8 oz (48.7 kg) IBW/kg (Calculated) : 43.2  Temp (24hrs), Avg:100.7 F (38.2 C), Min:98.9 F (37.2 C), Max:103.2 F (39.6 C)   Recent Labs Lab 03/14/16 0840 03/14/16 0956 03/14/16 1200  WBC 24.3*  --   --   CREATININE 0.49  --   --   LATICACIDVEN  --  1.7 1.4    Estimated Creatinine Clearance: 30.6 mL/min (by C-G formula based on SCr of 0.8 mg/dL).    Allergies  Allergen Reactions  . Aspirin     GI symptoms  . Bee Venom   . Morphine And Related   . Sulfa Antibiotics     dyspnea  . Tetracyclines & Related   . Codeine Rash  . Pamelor Rash  . Penicillins Rash   Antimicrobials this admission: Vancomycin 9/11 >>  Aztreonam 9/11 >>   Dose adjustments this admission:  Recent Results (from the past 240 hour(s))  Blood Culture (routine x 2)     Status: None (Preliminary result)   Collection Time: 03/14/16  9:56 AM  Result Value Ref Range Status   Specimen Description BLOOD  Final   Special Requests NONE  Final   Culture NO GROWTH <12 HOURS  Final   Report Status PENDING  Incomplete  Blood Culture (routine x 2)     Status: None (Preliminary result)   Collection Time: 03/14/16  9:56 AM  Result Value Ref Range Status   Specimen Description BLOOD  Final   Special Requests NONE  Final   Culture NO GROWTH <12 HOURS  Final   Report Status PENDING  Incomplete   Thank you for allowing pharmacy to be a part of this patient's care.  05/14/16 A 03/14/2016 1:46 PM

## 2016-03-14 NOTE — ED Notes (Signed)
CRITICAL VALUE ALERT  Critical value received:  Troponin 0.04  Date of notification:  03/14/16  Time of notification:  1039  Critical value read back:Yes.    Nurse who received alert:  c Ceylin Dreibelbis rn  MD notified (1st page):    Time of first page:    MD notified (2nd page):  Time of second page:  Responding MD:  D Rancour  Time MD responded:  1038

## 2016-03-14 NOTE — H&P (Signed)
History and Physical    Candice Meza:[id number] DOB: 1922-01-18 DOA: 03/14/2016  Referring MD/NP/PA: Glynn Meza, EDP PCP: Candice Pandy, MD  Patient coming from: SNF, [facility]  Chief Complaint: SOB, weakness,cough  HPI: Candice Meza is a delightful 82 y.o. female who lives at a SNF and comes in with a 3 day h/o progressive SOB and cough productive of thick yellow-greenish sputum. She has had a new oxygen requirement due to sats in the 80s at her SNF. She has also been feeling very weak. Says that over the past 6 weeks has lost enough weight to make her clothes very baggy (does not know exact amount of weight loss). In the ED she is found to have a UTI, hypokalemia, leukocytosis, CXR just shows low lung volumes. She denies CP to me. Had a temp of 103.2 in the ED. Admission requested.  Past Medical/Surgical History: Past Medical History:  Diagnosis Date  . Angina pectoris (HCC)   . Anxiety   . Ataxic gait   . Atherosclerotic heart disease   . Atrial fibrillation (HCC)   . Benign essential tremor   . COPD (chronic obstructive pulmonary disease) (HCC)   . Cough    Prolonged, possible asthmatic bronchitis  . Degenerative joint disease    versus rheumatoid arthritis  . Depression   . Disease of biliary tract   . Dysphagia   . GERD (gastroesophageal reflux disease)   . Hyperlipidemia   . Hypertension   . Hypokalemia   . Macular degeneration   . MI, acute, non ST segment elevation (HCC) 1997   Questionable lesion in the circumflex at catheterization; normal EF; chronic dyspnea without specific cause identified  . Osteopenia    Versus osteoporosis  . Postmenopausal    Estrogen replacement therapy  . Repeated falls   . Weakness     Past Surgical History:  Procedure Laterality Date  . BLADDER SUSPENSION    . ERCP  04/18/2012   Procedure: ENDOSCOPIC RETROGRADE CHOLANGIOPANCREATOGRAPHY (ERCP);  Surgeon: Malissa Hippo, MD;  Location: [facility];  Service:  Endoscopy;  Laterality: N/A;  Diagnostic  . FOOT SURGERY     Bilateral  . INGUINAL HERNIA REPAIR     Left    Social History:  reports that she has never smoked. She has never used smokeless tobacco. She reports that she does not drink alcohol or use drugs.  Allergies: Allergies  Allergen Reactions  . Aspirin     GI symptoms  . Bee Venom   . Morphine And Related   . Sulfa Antibiotics     dyspnea  . Tetracyclines & Related   . Codeine Rash  . Pamelor Rash  . Penicillins Rash    Family History:  Family History  Problem Relation Age of Onset  . Coronary artery disease    . Stroke      Prior to Admission medications   Medication Sig Start Date End Date Taking? Authorizing Provider  acetaminophen (TYLENOL) 500 MG tablet Take 500 mg by mouth 2 (two) times daily.    Yes Historical Provider, MD  albuterol (ACCUNEB) 1.25 MG/3ML nebulizer solution Take 1 ampule by nebulization every 4 (four) hours as needed for wheezing.   Yes Historical Provider, MD  ALPRAZolam (XANAX) 0.25 MG tablet Take 0.25 mg by mouth 2 (two) times daily. For anxiety   Yes Historical Provider, MD  alum & mag hydroxide-simeth (MYLANTA) 200-200-20 MG/5ML suspension Take 15 mLs by mouth at bedtime.   Yes Historical Provider, MD  benzonatate (TESSALON) 200 MG capsule Take 200 mg by mouth 3 (three) times daily as needed for cough.   Yes Historical Provider, MD  calcium-vitamin D (OSCAL WITH D) 500-200 MG-UNIT tablet Take 1 tablet by mouth daily.   Yes Historical Provider, MD  dextromethorphan (DELSYM) 30 MG/5ML liquid Take 60 mg by mouth 2 (two) times daily as needed. For  cough   Yes Historical Provider, MD  fluticasone (FLONASE) 50 MCG/ACT nasal spray Place 2 sprays into the nose daily.     Yes Historical Provider, MD  furosemide (LASIX) 40 MG tablet Take 1 tablet (40 mg total) by mouth 2 (two) times daily. 02/24/16 05/24/16 Yes Antoine Poche, MD  guaifenesin (HUMIBID E) 400 MG TABS tablet Take 600 mg by mouth 2  (two) times daily.    Yes Historical Provider, MD  guar gum powder Take 12 g by mouth 2 (two) times daily. In 8 oz of water   Yes Historical Provider, MD  hydrocortisone (ANUSOL-HC) 2.5 % rectal cream Place 1 application rectally as needed for hemorrhoids or itching.   Yes Historical Provider, MD  INCRUSE ELLIPTA 62.5 MCG/INH AEPB Inhale 1 Inhaler into the lungs daily. 02/23/16  Yes Historical Provider, MD  KLOR-CON M20 20 MEQ tablet Take 1 tablet by mouth daily. 05/28/14  Yes Historical Provider, MD  loratadine (CLARITIN) 10 MG tablet Take 10 mg by mouth daily.   Yes Historical Provider, MD  losartan (COZAAR) 25 MG tablet Take 1 tablet by mouth daily. 11/26/14  Yes Historical Provider, MD  meloxicam (MOBIC) 15 MG tablet Take 15 mg by mouth daily.   Yes Historical Provider, MD  Multiple Vitamin (MULTIVITAMIN) tablet Take 1 tablet by mouth daily.     Yes Historical Provider, MD  nitroGLYCERIN (NITROSTAT) 0.4 MG SL tablet Place 0.4 mg under the tongue as directed.     Yes Historical Provider, MD  omeprazole (PRILOSEC) 20 MG capsule Take 1 capsule by mouth daily. 06/04/14  Yes Historical Provider, MD  Polyethyl Glycol-Propyl Glycol (SYSTANE) 0.4-0.3 % SOLN Apply 1 drop to eye 3 (three) times daily.     Yes Historical Provider, MD  polyethylene glycol powder (GLYCOLAX/MIRALAX) powder Take 17 g by mouth daily.   Yes Historical Provider, MD  pravastatin (PRAVACHOL) 40 MG tablet Take 40 mg by mouth at bedtime.     Yes Historical Provider, MD  promethazine (PHENERGAN) 25 MG tablet Take 12.5 mg by mouth every 4 (four) hours as needed. For nausea   Yes Historical Provider, MD  SYMBICORT 160-4.5 MCG/ACT inhaler Inhale 2 Inhalers into the lungs 2 (two) times daily. 06/18/15  Yes Historical Provider, MD  traMADol (ULTRAM) 50 MG tablet Take 50 mg by mouth every 6 (six) hours as needed. For pain   Yes Historical Provider, MD  venlafaxine XR (EFFEXOR-XR) 75 MG 24 hr capsule Take 1 capsule by mouth daily. 02/08/16  Yes  Historical Provider, MD    Review of Systems:  Constitutional: Denies diaphoresis,  HEENT: Denies photophobia, eye pain, redness, hearing loss, ear pain, congestion, sore throat, rhinorrhea, sneezing, mouth sores, trouble swallowing, neck pain, neck stiffness and tinnitus.   Respiratory: Denies chest tightness,  and wheezing.   Cardiovascular: Denies chest pain, palpitations and leg swelling.  Gastrointestinal: Denies nausea, vomiting, abdominal pain, diarrhea, constipation, blood in stool and abdominal distention.  Genitourinary: Denies dysuria, urgency, frequency, hematuria, flank pain and difficulty urinating.  Endocrine: Denies: hot or cold intolerance, sweats, changes in hair or nails, polyuria, polydipsia. Musculoskeletal: Denies myalgias, back pain,  joint swelling, arthralgias and gait problem.  Skin: Denies pallor, rash and wound.  Neurological: Denies dizziness, seizures, syncope, weakness, light-headedness, numbness and headaches.  Hematological: Denies adenopathy. Easy bruising, personal or family bleeding history  Psychiatric/Behavioral: Denies suicidal ideation, mood changes, confusion, nervousness, sleep disturbance and agitation    Physical Exam: Vitals:   03/14/16 1100 03/14/16 1130 03/14/16 1200 03/14/16 1244  BP: (!) 105/50 (!) 103/50 (!) 101/51 (!) 99/41  Pulse: 77 82 74 79  Resp: 24 22 25 24   Temp:  99 F (37.2 C)  98.9 F (37.2 C)  TempSrc:  Oral  Oral  SpO2: 94% 95% 95% 96%  Weight:    48.7 kg (107 lb 5.8 oz)  Height:    4\' 11"  (1.499 m)     Constitutional: NAD, calm, comfortable, very pleasant and cooperative with exam. Eyes: PERRL, lids and conjunctivae normal ENMT: Mucous membranes are moist. Posterior pharynx clear of any exudate or lesions.Normal dentition.  Neck: normal, supple, no masses, no thyromegaly Respiratory: clear to auscultation bilaterally, no wheezing, no crackles. Normal respiratory effort. No accessory muscle use.  Cardiovascular:  Regular rate and rhythm, no murmurs / rubs / gallops. No extremity edema. 2+ pedal pulses. No carotid bruits.  Abdomen: no tenderness, no masses palpated. No hepatosplenomegaly. Bowel sounds positive.  Musculoskeletal: no clubbing / cyanosis. No C/C/E Skin: no rashes, lesions, ulcers. No induration Neurologic: CN 2-12 grossly intact. Sensation intact, DTR normal. Strength 5/5 in all 4.  Psychiatric: Normal judgment and insight. Alert and oriented x 3. Normal mood.    Labs on Admission: I have personally reviewed the following labs and imaging studies  CBC:  Recent Labs Lab 03/14/16 0840  WBC 24.3*  NEUTROABS 22.3*  HGB 13.0  HCT 40.5  MCV 100.2*  PLT 327   Basic Metabolic Panel:  Recent Labs Lab 03/14/16 0840  NA 140  K 3.3*  CL 95*  CO2 32  GLUCOSE 113*  BUN 24*  CREATININE 0.49  CALCIUM 9.1   GFR: Estimated Creatinine Clearance: 30.6 mL/min (by C-G formula based on SCr of 0.8 mg/dL). Liver Function Tests:  Recent Labs Lab 03/14/16 0840  AST 22  ALT 14  ALKPHOS 55  BILITOT 0.5  PROT 7.1  ALBUMIN 4.0   No results for input(s): LIPASE, AMYLASE in the last 168 hours. No results for input(s): AMMONIA in the last 168 hours. Coagulation Profile:  Recent Labs Lab 03/14/16 0840  INR 0.95   Cardiac Enzymes:  Recent Labs Lab 03/14/16 0956  TROPONINI 0.04*   BNP (last 3 results) No results for input(s): PROBNP in the last 8760 hours. HbA1C: No results for input(s): HGBA1C in the last 72 hours. CBG: No results for input(s): GLUCAP in the last 168 hours. Lipid Profile: No results for input(s): CHOL, HDL, LDLCALC, TRIG, CHOLHDL, LDLDIRECT in the last 72 hours. Thyroid Function Tests: No results for input(s): TSH, T4TOTAL, FREET4, T3FREE, THYROIDAB in the last 72 hours. Anemia Panel: No results for input(s): VITAMINB12, FOLATE, FERRITIN, TIBC, IRON, RETICCTPCT in the last 72 hours. Urine analysis:    Component Value Date/Time   COLORURINE YELLOW  03/14/2016 0855   APPEARANCEUR CLOUDY (A) 03/14/2016 0855   LABSPEC 1.010 03/14/2016 0855   PHURINE 7.0 03/14/2016 0855   GLUCOSEU NEGATIVE 03/14/2016 0855   HGBUR NEGATIVE 03/14/2016 0855   BILIRUBINUR NEGATIVE 03/14/2016 0855   KETONESUR NEGATIVE 03/14/2016 0855   PROTEINUR NEGATIVE 03/14/2016 0855   UROBILINOGEN 4.0 (H) 04/17/2012 2304   NITRITE NEGATIVE 03/14/2016 0855  LEUKOCYTESUR MODERATE (A) 03/14/2016 0855   Sepsis Labs: @LABRCNTIP (procalcitonin:4,lacticidven:4) ) Recent Results (from the past 240 hour(s))  Blood Culture (routine x 2)     Status: None (Preliminary result)   Collection Time: 03/14/16  9:56 AM  Result Value Ref Range Status   Specimen Description BLOOD  Final   Special Requests NONE  Final   Culture NO GROWTH <12 HOURS  Final   Report Status PENDING  Incomplete  Blood Culture (routine x 2)     Status: None (Preliminary result)   Collection Time: 03/14/16  9:56 AM  Result Value Ref Range Status   Specimen Description BLOOD  Final   Special Requests NONE  Final   Culture NO GROWTH <12 HOURS  Final   Report Status PENDING  Incomplete     Radiological Exams on Admission: Dg Chest 2 View  Result Date: 03/14/2016 CLINICAL DATA:  Cough and fever and shortness of breath EXAM: CHEST  2 VIEW COMPARISON:  07/02/2015 FINDINGS: Aortic atherosclerosis identified. There are decreased lung volumes with asymmetric elevation of the left hemidiaphragm. Atelectasis is noted within both lung bases. IMPRESSION: 1. Low lung volumes. 2. Bibasilar atelectasis. 3. Aortic atherosclerosis. Electronically Signed   By: Signa Kellaylor  Stroud M.D.   On: 03/14/2016 09:33    EKG: Independently reviewed. Mild inf ST depressions that appear somewhat chronic compared to baseline.  Assessment/Plan Principal Problem:   HCAP (healthcare-associated pneumonia) Active Problems:   Essential hypertension   UTI (urinary tract infection)   Generalized weakness   Leukocytosis   Hypokalemia     Generalized Weakness -Presumed due to acute infectious processes on top of baseline age and deconditioning. -Treat underlying infections. -[facility] need for PT evaluations [facility] -Check TSH/B12.  HCAP, presumed -Without sepsis, altho does meet SIRS criteria given temperature and leukocytosis. -No infiltrate apparent on initial CXR (altho was a poor quality film). -Despite this, given fever, cough and thick greenish sputum production, will elect to cover for HCAP with vanc/aztreonam given her SNF status and PCN allergy.  Acute Hypoxemic Respiratory Failure -On account of HCAP. -Wean oxygen as tolerated.  UTI -Aztreonam should cover. -Cx data requested.  Leukocytosis -Due to acute infections. -Follow with antibiotics.  Hypokalemia -Mild, replete orally, check Mg level.  HTN -Hold off on BP meds as BP low normal at present   DVT prophylaxis: Lovenox   Code Status: Full Code  Family Communication: Patient only  Disposition Plan: Anticipate DC back to SNF in 48-72 hours  Consults called: None  Admission status: Inpatient    Time Spent: 75 minutes  Chaya JanHERNANDEZ ACOSTA,Diamon Reddinger MD [facility] Pager [phone]  If 7PM-7AM, please contact night-coverage [email or web address] Password [email or web address]  03/14/2016, 6:15 PM

## 2016-03-15 DIAGNOSIS — D72829 Elevated white blood cell count, unspecified: Secondary | ICD-10-CM

## 2016-03-15 LAB — CBC
HCT: 33.6 % — ABNORMAL LOW (ref 36.0–46.0)
Hemoglobin: 10.7 g/dL — ABNORMAL LOW (ref 12.0–15.0)
MCH: 32.8 pg (ref 26.0–34.0)
MCHC: 31.8 g/dL (ref 30.0–36.0)
MCV: 103.1 fL — AB (ref 78.0–100.0)
PLATELETS: 266 10*3/uL (ref 150–400)
RBC: 3.26 MIL/uL — AB (ref 3.87–5.11)
RDW: 13.5 % (ref 11.5–15.5)
WBC: 11.6 10*3/uL — ABNORMAL HIGH (ref 4.0–10.5)

## 2016-03-15 LAB — BASIC METABOLIC PANEL
Anion gap: 6 (ref 5–15)
BUN: 20 mg/dL (ref 6–20)
CHLORIDE: 105 mmol/L (ref 101–111)
CO2: 28 mmol/L (ref 22–32)
CREATININE: 0.35 mg/dL — AB (ref 0.44–1.00)
Calcium: 8.5 mg/dL — ABNORMAL LOW (ref 8.9–10.3)
GFR calc non Af Amer: >60 mL/min (ref >60)
Glucose, Bld: 99 mg/dL (ref 65–99)
Potassium: 3.8 mmol/L (ref 3.5–5.1)
Sodium: 139 mmol/L (ref 135–145)

## 2016-03-15 LAB — VITAMIN B12: Vitamin B-12: 224 pg/mL (ref 180–914)

## 2016-03-15 LAB — URINE CULTURE

## 2016-03-15 LAB — MRSA PCR SCREENING: MRSA BY PCR: NEGATIVE

## 2016-03-15 MED ORDER — POLYVINYL ALCOHOL 1.4 % OP SOLN
OPHTHALMIC | Status: AC
  Filled 2016-03-15: qty 15

## 2016-03-15 NOTE — Progress Notes (Signed)
PROGRESS NOTE    Candice Meza  OZD:[id number] DOB: 12-04-1921 DOA: 03/14/2016 PCP: Estanislado Pandy, MD     Brief Narrative:  82 y/o woman admitted from SNF on 9/11 with SOB, weakness and cough with sputum production. She is thought to have HCAP and a UTI.   Assessment & Plan:   Principal Problem:   HCAP (healthcare-associated pneumonia) Active Problems:   Essential hypertension   UTI (urinary tract infection)   Generalized weakness   Leukocytosis   Hypokalemia   Generalized Weakness -TSH WNL; B12 pending. -Suspect related to acute infectious processes.  -No need for PT as she resides at [facility].  Presumed HCAP -Despite no infiltrate on CXR have elected to treat given fever, cough and sputum production. -Continue vancomycin and aztreonam for now. -She is symptomatically improving and I suspect we can transition to PO abx over the course of the next 24 hours.  Acute Hypoxemic Respiratory Failure -Due to PNA. -Wean oxygen as tolerated.  UTI -Continue aztreonam pending cx data.  Leukocytosis -Improved, down to 11.6 from 24.3 on admission.  Hypokalemia -Replaced. -Mg ok at 2.2.  HTN -BP meds remain on hold. -BP low normal.   DVT prophylaxis: lovenox Code Status: full code Family Communication: none Disposition Plan: back to SNF in 24-48 hours  Consultants:   none  Procedures:   none  Antimicrobials:   Vancomycin 9/11>>  Aztreonam 9/11>>    Subjective: Feels improved, less SOB  Objective: Vitals:   03/14/16 2056 03/15/16 0500 03/15/16 0750 03/15/16 1343  BP: (!) 108/44 (!) 104/44  (!) 118/45  Pulse: 66 81  74  Resp: (!) 22 20  20   Temp: 98.1 F (36.7 C) 98.9 F (37.2 C)  97.6 F (36.4 C)  TempSrc: Oral Oral  Oral  SpO2: 96% 91% 94% 96%  Weight:      Height:        Intake/Output Summary (Last 24 hours) at 03/15/16 1605 Last data filed at 03/15/16 1344  Gross per 24 hour  Intake              770 ml  Output              850 ml    Net              -80 ml   Filed Weights   03/14/16 0842 03/14/16 1244  Weight: 48.5 kg (107 lb) 48.7 kg (107 lb 5.8 oz)    Examination:  General exam: Alert, awake, oriented x 3 Respiratory system: mild bilateral ronchi Cardiovascular system:RRR. No murmurs, rubs, gallops. Gastrointestinal system: Abdomen is nondistended, soft and nontender. No organomegaly or masses felt. Normal bowel sounds heard. Central nervous system: Alert and oriented. No focal neurological deficits. Extremities: No C/C/E, +pedal pulses Skin: No rashes, lesions or ulcers Psychiatry: Judgement and insight appear normal. Mood & affect appropriate.     Data Reviewed: I have personally reviewed following labs and imaging studies  CBC:  Recent Labs Lab 03/14/16 0840 03/14/16 2043 03/15/16 0541  WBC 24.3* 17.3* 11.6*  NEUTROABS 22.3*  --   --   HGB 13.0 10.9* 10.7*  HCT 40.5 33.6* 33.6*  MCV 100.2* 100.0 103.1*  PLT 327 266 266   Basic Metabolic Panel:  Recent Labs Lab 03/14/16 0840 03/14/16 2043 03/15/16 0541  NA 140  --  139  K 3.3*  --  3.8  CL 95*  --  105  CO2 32  --  28  GLUCOSE 113*  --  99  BUN 24*  --  20  CREATININE 0.49 0.37* 0.35*  CALCIUM 9.1  --  8.5*  MG  --  2.2  --    GFR: Estimated Creatinine Clearance: 30.6 mL/min (by C-G formula based on SCr of 0.8 mg/dL). Liver Function Tests:  Recent Labs Lab 03/14/16 0840  AST 22  ALT 14  ALKPHOS 55  BILITOT 0.5  PROT 7.1  ALBUMIN 4.0   No results for input(s): LIPASE, AMYLASE in the last 168 hours. No results for input(s): AMMONIA in the last 168 hours. Coagulation Profile:  Recent Labs Lab 03/14/16 0840  INR 0.95   Cardiac Enzymes:  Recent Labs Lab 03/14/16 0956  TROPONINI 0.04*   BNP (last 3 results) No results for input(s): PROBNP in the last 8760 hours. HbA1C: No results for input(s): HGBA1C in the last 72 hours. CBG: No results for input(s): GLUCAP in the last 168 hours. Lipid Profile: No results  for input(s): CHOL, HDL, LDLCALC, TRIG, CHOLHDL, LDLDIRECT in the last 72 hours. Thyroid Function Tests:  Recent Labs  03/14/16 2043  TSH 2.811   Anemia Panel: No results for input(s): VITAMINB12, FOLATE, FERRITIN, TIBC, IRON, RETICCTPCT in the last 72 hours. Urine analysis:    Component Value Date/Time   COLORURINE YELLOW 03/14/2016 0855   APPEARANCEUR CLOUDY (A) 03/14/2016 0855   LABSPEC 1.010 03/14/2016 0855   PHURINE 7.0 03/14/2016 0855   GLUCOSEU NEGATIVE 03/14/2016 0855   HGBUR NEGATIVE 03/14/2016 0855   BILIRUBINUR NEGATIVE 03/14/2016 0855   KETONESUR NEGATIVE 03/14/2016 0855   PROTEINUR NEGATIVE 03/14/2016 0855   UROBILINOGEN 4.0 (H) 04/17/2012 2304   NITRITE NEGATIVE 03/14/2016 0855   LEUKOCYTESUR MODERATE (A) 03/14/2016 0855   Sepsis Labs: @LABRCNTIP (procalcitonin:4,lacticidven:4)  ) Recent Results (from the past 240 hour(s))  Urine culture     Status: Abnormal   Collection Time: 03/14/16  9:10 AM  Result Value Ref Range Status   Specimen Description URINE, CLEAN CATCH  Final   Special Requests NONE  Final   Culture MULTIPLE SPECIES PRESENT, SUGGEST RECOLLECTION (A)  Final   Report Status 03/15/2016 FINAL  Final  Blood Culture (routine x 2)     Status: None (Preliminary result)   Collection Time: 03/14/16  9:56 AM  Result Value Ref Range Status   Specimen Description BLOOD LEFT HAND  Final   Special Requests BOTTLES DRAWN AEROBIC AND ANAEROBIC 5CC  Final   Culture NO GROWTH 1 DAY  Final   Report Status PENDING  Incomplete  Blood Culture (routine x 2)     Status: None (Preliminary result)   Collection Time: 03/14/16  9:56 AM  Result Value Ref Range Status   Specimen Description BLOOD LEFT FOREARM  Final   Special Requests BOTTLES DRAWN AEROBIC ONLY 4CC  Final   Culture NO GROWTH 1 DAY  Final   Report Status PENDING  Incomplete  MRSA PCR Screening     Status: Abnormal   Collection Time: 03/14/16  1:05 PM  Result Value Ref Range Status   MRSA by PCR (A)  NEGATIVE Final    INVALID, UNABLE TO DETERMINE THE PRESENCE OF TARGET DNA DUE TO SPECIMEN INTEGRITY. RECOLLECTION REQUESTED.    Comment: CRITICAL RESULT CALLED TO, READ BACK BY AND VERIFIED WITH: MORRIS C AT 0529 ON [id number] BY FORSYTH K        The GeneXpert MRSA Assay (FDA approved for NASAL specimens only), is one component of a comprehensive MRSA colonization surveillance program. It is not intended to diagnose MRSA infection  nor to guide or monitor treatment for MRSA infections.          Radiology Studies: Dg Chest 2 View  Result Date: 03/14/2016 CLINICAL DATA:  Cough and fever and shortness of breath EXAM: CHEST  2 VIEW COMPARISON:  07/02/2015 FINDINGS: Aortic atherosclerosis identified. There are decreased lung volumes with asymmetric elevation of the left hemidiaphragm. Atelectasis is noted within both lung bases. IMPRESSION: 1. Low lung volumes. 2. Bibasilar atelectasis. 3. Aortic atherosclerosis. Electronically Signed   By: Signa Kell M.D.   On: 03/14/2016 09:33        Scheduled Meds: . ALPRAZolam  0.25 mg Oral BID  . aztreonam  1 g Intravenous Q8H  . enoxaparin (LOVENOX) injection  40 mg Subcutaneous Q24H  . guaiFENesin  600 mg Oral BID  . loratadine  10 mg Oral Daily  . meloxicam  15 mg Oral Daily  . mometasone-formoterol  2 puff Inhalation BID  . multivitamin with minerals  1 tablet Oral Daily  . nitroGLYCERIN  0.4 mg Sublingual UD  . pantoprazole  40 mg Oral Daily  . polyethylene glycol  17 g Oral Daily  . polyvinyl alcohol  1 drop Both Eyes TID  . pravastatin  40 mg Oral QHS  . sodium chloride flush  3 mL Intravenous Q12H  . venlafaxine XR  75 mg Oral Daily   Continuous Infusions:    LOS: 1 day    Time spent: 25 minutes. Greater than 50% of this time was spent in direct contact with the patient coordinating care.     Chaya Jan, MD [facility] Pager [phone]  If 7PM-7AM, please contact  night-coverage [email or web address] Password [facility] 03/15/2016, 4:05 PM

## 2016-03-15 NOTE — [place] FL2 (Signed)
[place] MEDICAID FL2 LEVEL OF CARE SCREENING TOOL     IDENTIFICATION  Patient Name: Candice Meza Birthdate: July 11, 1921 Sex: female Admission Date (Current Location): 03/14/2016  [place] and [place] Number:  Aaron Edelman [id number] K Facility and Address:  [facility],  [address], [place] [address]      Provider Number: [id number]  Attending Physician Name and Address:  Micael Hampshire Acost*  Relative Name and Phone Number:       Current Level of Care: Hospital Recommended Level of Care: Skilled Nursing Facility Prior Approval Number:    Date Approved/Denied:   PASRR Number: [id number]  Discharge Plan: SNF    Current Diagnoses: Patient Active Problem List   Diagnosis Date Noted  . HCAP (healthcare-associated pneumonia) 03/14/2016  . UTI (urinary tract infection) 03/14/2016  . Generalized weakness 03/14/2016  . Leukocytosis 03/14/2016  . Hypokalemia 03/14/2016  . Nausea and vomiting in adult 04/18/2012  . Abdominal pain 04/18/2012  . Transaminitis 04/18/2012  . UTI (lower urinary tract infection) 04/18/2012  . CAD (coronary artery disease) 04/18/2012  . Anemia 06/24/2011  . Repeated falls   . MI, acute, non ST segment elevation (HCC)   . Macular degeneration   . Asthmatic bronchitis , chronic (HCC) 03/24/2011  . BILIARY TRACT DISORDER 05/10/2010  . Essential hypertension 05/04/2009  . HYPERTENSION 05/04/2009  . Gastroesophageal reflux disease 05/04/2009  . DEGENERATIVE JOINT DISEASE 05/04/2009  . OSTEOPENIA 05/04/2009    Orientation RESPIRATION BLADDER Height & Weight     Self, Time, Situation, Place  O2 (2 L) Incontinent Weight: 107 lb 5.8 oz (48.7 kg) Height:  4\' 11"  (149.9 cm)  BEHAVIORAL SYMPTOMS/MOOD NEUROLOGICAL BOWEL NUTRITION STATUS  Other (Comment) (n/a)  (n/a) Incontinent Diet (Heart healthy)  AMBULATORY STATUS COMMUNICATION OF NEEDS Skin   Extensive Assist Verbally Normal                       Personal Care  Assistance Level of Assistance  Bathing, Feeding, Dressing Bathing Assistance: Limited assistance Feeding assistance: Limited assistance Dressing Assistance: Limited assistance     Functional Limitations Info  Sight, Hearing, Speech Sight Info: Adequate Hearing Info: Impaired Speech Info: Adequate    SPECIAL CARE FACTORS FREQUENCY                       Contractures Contractures Info: Not present    Additional Factors Info  Isolation Precautions Code Status Info: Full code Allergies Info: Aspirin, Bee Venom, Morphine and Related, Sulfa Antibiotics, Tetracyclines & Related, Codeine, Pamelor, Penicillins     Isolation Precautions Info: 03/14/16 MRSA PCR Screen = Positive      Current Medications (03/15/2016):  This is the current hospital active medication list Current Facility-Administered Medications  Medication Dose Route Frequency Provider Last Rate Last Dose  . 0.9 %  sodium chloride infusion  250 mL Intravenous PRN 05/15/2016, MD      . acetaminophen (TYLENOL) tablet 650 mg  650 mg Oral Q6H PRN Henderson Cloud, MD       Or  . acetaminophen (TYLENOL) suppository 650 mg  650 mg Rectal Q6H PRN Henderson Cloud, MD      . ALPRAZolam Henderson Cloud) tablet 0.25 mg  0.25 mg Oral BID Prudy Feeler, MD   0.25 mg at 03/15/16 1107  . aztreonam (AZACTAM) 1 g in dextrose 5 % 50 mL IVPB  1 g Intravenous Q8H Estela 05/15/16, MD  1 g at 03/15/16 1105  . benzonatate (TESSALON) capsule 200 mg  200 mg Oral TID PRN Henderson Cloud, MD      . enoxaparin (LOVENOX) injection 40 mg  40 mg Subcutaneous Q24H Henderson Cloud, MD   40 mg at 03/14/16 2143  . guaiFENesin (MUCINEX) 12 hr tablet 600 mg  600 mg Oral BID Henderson Cloud, MD   600 mg at 03/15/16 1107  . hydrocortisone (ANUSOL-HC) 2.5 % rectal cream 1 application  1 application Rectal TID PRN Henderson Cloud, MD      . loratadine (CLARITIN) tablet  10 mg  10 mg Oral Daily Henderson Cloud, MD   10 mg at 03/15/16 1107  . meloxicam (MOBIC) tablet 15 mg  15 mg Oral Daily Henderson Cloud, MD   15 mg at 03/15/16 1107  . mometasone-formoterol (DULERA) 200-5 MCG/ACT inhaler 2 puff  2 puff Inhalation BID Henderson Cloud, MD   2 puff at 03/15/16 0750  . multivitamin with minerals tablet 1 tablet  1 tablet Oral Daily Estela Isaiah Blakes, MD   1 tablet at 03/15/16 1107  . nitroGLYCERIN (NITROSTAT) SL tablet 0.4 mg  0.4 mg Sublingual UD Henderson Cloud, MD      . ondansetron [facility]) tablet 4 mg  4 mg Oral Q6H PRN Henderson Cloud, MD       Or  . ondansetron [facility]) injection 4 mg  4 mg Intravenous Q6H PRN Henderson Cloud, MD      . pantoprazole (PROTONIX) EC tablet 40 mg  40 mg Oral Daily Henderson Cloud, MD   40 mg at 03/15/16 1107  . polyethylene glycol (MIRALAX / GLYCOLAX) packet 17 g  17 g Oral Daily Henderson Cloud, MD   17 g at 03/15/16 1106  . polyvinyl alcohol (LIQUIFILM TEARS) 1.4 % ophthalmic solution 1 drop  1 drop Both Eyes TID Henderson Cloud, MD   1 drop at 03/15/16 1106  . pravastatin (PRAVACHOL) tablet 40 mg  40 mg Oral QHS Henderson Cloud, MD   40 mg at 03/14/16 2142  . senna-docusate (Senokot-S) tablet 1 tablet  1 tablet Oral QHS PRN Henderson Cloud, MD      . sodium chloride flush (NS) 0.9 % injection 3 mL  3 mL Intravenous Q12H Estela Isaiah Blakes, MD   3 mL at 03/15/16 1108  . sodium chloride flush (NS) 0.9 % injection 3 mL  3 mL Intravenous PRN Henderson Cloud, MD      . traMADol Janean Sark) tablet 50 mg  50 mg Oral Q6H PRN Henderson Cloud, MD      . venlafaxine XR (EFFEXOR-XR) 24 hr capsule 75 mg  75 mg Oral Daily Henderson Cloud, MD   75 mg at 03/15/16 1106     Discharge Medications: Please see discharge summary for a list of discharge medications.  Relevant Imaging  Results:  Relevant Lab Results:   Additional Information    Karn Cassis, [place] [phone]

## 2016-03-15 NOTE — Clinical Social Work Note (Signed)
Clinical Social Work Assessment  Patient Details  Name: Candice Meza MRN: [id number] Date of Birth: August 31, 1921  Date of referral:  03/15/16               Reason for consult:  Discharge Planning                Permission sought to share information with:  Facility Art therapist granted to share information::  Yes, Verbal Permission Granted  Name::        Agency::  [facility]  Relationship::  facility  Contact Information:     Housing/Transportation Living arrangements for the past 2 months:  [place] of Information:  Patient, Facility Patient Interpreter Needed:  None Criminal Activity/Legal Involvement Pertinent to Current Situation/Hospitalization:  No - Comment as needed Significant Relationships:  Adult Children Lives with:  Facility Resident Do you feel safe going back to the place where you live?  Yes Need for family participation in patient care:  No (Coment)  Care giving concerns:  None reported. Pt is long term resident at [facility].    Social Worker assessment / plan:  CSW met with pt at bedside. Pt alert and oriented and reports she has been a resident at [facility] for almost 7 years. She has two sons- one lives in [place] and the other on the [facility]. She shares she came to hospital due to a fever and admitted with HCAP. 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 She mentioned that she has a tablet at the facility that her family gave her, but she does not know how to use it and would really enjoy it if she could. CSW spoke with Gerald Stabs at facility who agrees to assist pt with this. Pt very appreciative. Per Gerald Stabs, pt is nursing level of care and okay to return. Pt has had some weight loss in the last few months. She uses a wheelchair at baseline.   Employment status:  Retired Forensic scientist:  Medicare PT Recommendations:  Not assessed at this time [place] /  Referral to community resources:  Other (Comment Required) (Return to [facility])  Patient/Family's Response to care:  Pt requests to return to [facility] when medically stable.   Patient/Family's Understanding of and Emotional Response to Diagnosis, Current Treatment, and Prognosis:  Pt aware of admission diagnosis and treatment plan.   Emotional Assessment Appearance:  Appears stated age Attitude/Demeanor/Rapport:  Other (Cooperative) Affect (typically observed):  Appropriate, Accepting Orientation:  Oriented to Self, Oriented to Place, Oriented to  Time, Oriented to Situation Alcohol / Substance use:  Not Applicable Psych involvement (Current and /or in the community):  No (Comment)  Discharge Needs  Concerns to be addressed:  Discharge Planning Concerns Readmission within the last 30 days:  No Current discharge risk:  None Barriers to Discharge:  Continued Medical Work up   [employer], [employer], [place] 03/15/2016, 9:44 AM [phone]

## 2016-03-15 NOTE — Consult Note (Signed)
   [facility] CM Inpatient Consult   03/15/2016  BRYAN OMURA March 08, 1922 [id number]  Patient screened for potential [facility] Care Management services. Patient is eligible for [facility] Services. Electronic medical record reveals patient's discharge plan is to return to long term care facility as a resident, there were no identifiable [facility] care management needs at this time. [facility] Care Management services not appropriate at this time. If patient's post hospital needs change please place a [facility] Care Management consult.  For questions please contact:    Alben Spittle. Albertha Ghee, RN, BSN, [facility] [phone]) Business Cell  [phone]) Toll Free Office

## 2016-03-16 ENCOUNTER — Inpatient Hospital Stay (HOSPITAL_COMMUNITY): Payer: Medicare Other

## 2016-03-16 DIAGNOSIS — I10 Essential (primary) hypertension: Secondary | ICD-10-CM

## 2016-03-16 DIAGNOSIS — R531 Weakness: Secondary | ICD-10-CM

## 2016-03-16 DIAGNOSIS — J189 Pneumonia, unspecified organism: Secondary | ICD-10-CM

## 2016-03-16 DIAGNOSIS — N390 Urinary tract infection, site not specified: Secondary | ICD-10-CM

## 2016-03-16 DIAGNOSIS — E876 Hypokalemia: Secondary | ICD-10-CM

## 2016-03-16 LAB — HIV ANTIBODY (ROUTINE TESTING W REFLEX): HIV Screen 4th Generation wRfx: NONREACTIVE

## 2016-03-16 LAB — STREP PNEUMONIAE URINARY ANTIGEN: Strep Pneumo Urinary Antigen: NEGATIVE

## 2016-03-16 MED ORDER — FUROSEMIDE 20 MG PO TABS
10.0000 mg | ORAL_TABLET | Freq: Two times a day (BID) | ORAL | Status: DC
  Administered 2016-03-16: 10 mg via ORAL
  Administered 2016-03-17: 10:00:00 via ORAL
  Filled 2016-03-16 (×2): qty 1

## 2016-03-16 MED ORDER — LOSARTAN POTASSIUM 50 MG PO TABS
25.0000 mg | ORAL_TABLET | Freq: Every day | ORAL | Status: DC
  Administered 2016-03-16: 25 mg via ORAL
  Administered 2016-03-17: 10:00:00 via ORAL
  Filled 2016-03-16 (×2): qty 1

## 2016-03-16 MED ORDER — POTASSIUM CHLORIDE CRYS ER 10 MEQ PO TBCR
10.0000 meq | EXTENDED_RELEASE_TABLET | Freq: Two times a day (BID) | ORAL | Status: AC
  Administered 2016-03-16 (×2): 10 meq via ORAL
  Filled 2016-03-16 (×2): qty 1

## 2016-03-16 NOTE — Progress Notes (Signed)
PROGRESS NOTE    Candice Meza  ZTI:[id number] DOB: 1921-12-02 DOA: 03/14/2016 PCP: Estanislado Pandy, MD (Confirm with patient/family/NH records and if not entered, this HAS to be entered at [facility] point of entry. "No PCP" if truly none.)   Brief Narrative:  Patient is a 82 year old woman with CAD, HTN, severe arthritis DJD versus RA, who presented from [facility] with a complaint of generalized weakness, cough, shortness of breath. Reportedly, her oxygen saturation was in the 80s at the SNF. In the ED, she was febrile with a temperature of 103.2. She was hemodynamically stable. She was oxygenating 94% on 3 L nasal cannula oxygen. Her urinalysis was consistent with a UTI. Her chest x-ray revealed low lung volumes. Her W BC was 24.3. She was admitted for further evaluation and management of pneumonia and UTI.   Assessment & Plan:   Principal Problem:   HCAP (healthcare-associated pneumonia) Active Problems:   Essential hypertension   UTI (urinary tract infection)   Generalized weakness   Leukocytosis   Hypokalemia    1. Healthcare associated pneumonia. Due to the patient's presenting symptomatology, she was started on treatment for pneumonia, despite the findings of the chest x-ray. Vancomycin and aztreonam were started. A number studies were ordered. Strep pneumo antigen and Legionella antigen are both pending. HIV was nonreactive. Blood cultures are negative to date. The patient is improving clinically and symptomatically. Her white blood cell count is trending down toward normal. -We'll check a follow-up chest x-ray as she is still slightly symptomatic. -Continue IV antibiotics.  Acute respiratory failure with hypoxia. Reportedly, the patient's oxygen saturation saturations were in the 80s at the SNF. She was started on oxygen supplementation. Her oxygen saturation has improved and we will try to wean her off of the supplement oxygen.  Urinary tract infection. Patient  urinalysis on admission revealed too numerous to count WBCs. Antibiotics as above were started. Urine culture ordered and sent; pending.  Essential hypertension. Patient is treated chronically with Lasix and Cozaar. Lasix was held on admission. Her blood pressure was on the low normal side on admission, but has trended up, but still reasonable. Cozaar was restarted. -We'll restart a lower dose of Lasix.   Hypokalemia. Patient's serum potassium was 3.3 on admission. She was supplemented and repleted. Her serum potassium has improved. We'll continue to monitor.  Generalized weakness. For evaluation, B12 and TSH were ordered. They were both within normal limits. Her generalized weakness is chronic, but likely exacerbated by the infections. She is chronically wheelchair bound from her severe arthritis.        DVT prophylaxis: Lovenox Code Status: Full code Family Communication: Family not available Disposition Plan: Discharge back to SNF in 1-2 days.   Consultants:   None  Procedures:   None  Antimicrobials:   Vancomycin 9/11>>  Aztreonam 9/11>>   Subjective: Patient says that she is breathing somewhat better, but tends to tire easily.  Objective: Vitals:   03/15/16 1931 03/15/16 2041 03/16/16 0439 03/16/16 0712  BP:  (!) 110/45 (!) 133/47   Pulse:  72 71   Resp:  20 20   Temp:  98.2 F (36.8 C) 98.4 F (36.9 C)   TempSrc:  Oral Oral   SpO2: 96% 96% 97% 97%  Weight:      Height:        Intake/Output Summary (Last 24 hours) at 03/16/16 1408 Last data filed at 03/16/16 1255  Gross per 24 hour  Intake  0 ml  Output              800 ml  Net             -800 ml   Filed Weights   03/14/16 0842 03/14/16 1244  Weight: 48.5 kg (107 lb) 48.7 kg (107 lb 5.8 oz)    Examination:  General exam: Appears calm and comfortable  Respiratory system: She base with crackles Respiratory effort normal. Cardiovascular system: S1 & S2 with 1 to 2/6 systolic  murmur No pedal edema. Gastrointestinal system: Abdomen is nondistended, soft and nontender. No organomegaly or masses felt. Normal bowel sounds heard. Central nervous system: Alert and oriented. No focal neurological deficits. Extremities: Severe arthritic changes in her hands with rheumatoid hypertrophic changes in the MCP and DIP joints. Skin: No rashes, lesions or ulcers Psychiatry: Judgement and insight appear normal. Mood & affect appropriate.     Data Reviewed: I have personally reviewed following labs and imaging studies  CBC:  Recent Labs Lab 03/14/16 0840 03/14/16 2043 03/15/16 0541  WBC 24.3* 17.3* 11.6*  NEUTROABS 22.3*  --   --   HGB 13.0 10.9* 10.7*  HCT 40.5 33.6* 33.6*  MCV 100.2* 100.0 103.1*  PLT 327 266 266   Basic Metabolic Panel:  Recent Labs Lab 03/14/16 0840 03/14/16 2043 03/15/16 0541  NA 140  --  139  K 3.3*  --  3.8  CL 95*  --  105  CO2 32  --  28  GLUCOSE 113*  --  99  BUN 24*  --  20  CREATININE 0.49 0.37* 0.35*  CALCIUM 9.1  --  8.5*  MG  --  2.2  --    GFR: Estimated Creatinine Clearance: 30.6 mL/min (by C-G formula based on SCr of 0.35 mg/dL (L)). Liver Function Tests:  Recent Labs Lab 03/14/16 0840  AST 22  ALT 14  ALKPHOS 55  BILITOT 0.5  PROT 7.1  ALBUMIN 4.0   No results for input(s): LIPASE, AMYLASE in the last 168 hours. No results for input(s): AMMONIA in the last 168 hours. Coagulation Profile:  Recent Labs Lab 03/14/16 0840  INR 0.95   Cardiac Enzymes:  Recent Labs Lab 03/14/16 0956  TROPONINI 0.04*   BNP (last 3 results) No results for input(s): PROBNP in the last 8760 hours. HbA1C: No results for input(s): HGBA1C in the last 72 hours. CBG: No results for input(s): GLUCAP in the last 168 hours. Lipid Profile: No results for input(s): CHOL, HDL, LDLCALC, TRIG, CHOLHDL, LDLDIRECT in the last 72 hours. Thyroid Function Tests:  Recent Labs  03/14/16 2043  TSH 2.811   Anemia Panel:  Recent  Labs  03/14/16 2043  VITAMINB12 224   Sepsis Labs:  Recent Labs Lab 03/14/16 0956 03/14/16 1200  LATICACIDVEN 1.7 1.4    Recent Results (from the past 240 hour(s))  Urine culture     Status: Abnormal   Collection Time: 03/14/16  9:10 AM  Result Value Ref Range Status   Specimen Description URINE, CLEAN CATCH  Final   Special Requests NONE  Final   Culture MULTIPLE SPECIES PRESENT, SUGGEST RECOLLECTION (A)  Final   Report Status 03/15/2016 FINAL  Final  Blood Culture (routine x 2)     Status: None (Preliminary result)   Collection Time: 03/14/16  9:56 AM  Result Value Ref Range Status   Specimen Description BLOOD LEFT HAND  Final   Special Requests BOTTLES DRAWN AEROBIC AND ANAEROBIC 5CC  Final  Culture NO GROWTH 2 DAYS  Final   Report Status PENDING  Incomplete  Blood Culture (routine x 2)     Status: None (Preliminary result)   Collection Time: 03/14/16  9:56 AM  Result Value Ref Range Status   Specimen Description BLOOD LEFT FOREARM  Final   Special Requests BOTTLES DRAWN AEROBIC ONLY 4CC  Final   Culture NO GROWTH 2 DAYS  Final   Report Status PENDING  Incomplete  MRSA PCR Screening     Status: Abnormal   Collection Time: 03/14/16  1:05 PM  Result Value Ref Range Status   MRSA by PCR (A) NEGATIVE Final    INVALID, UNABLE TO DETERMINE THE PRESENCE OF TARGET DNA DUE TO SPECIMEN INTEGRITY. RECOLLECTION REQUESTED.    Comment: CRITICAL RESULT CALLED TO, READ BACK BY AND VERIFIED WITH: MORRIS C AT 0529 ON [id number] BY FORSYTH K        The GeneXpert MRSA Assay (FDA approved for NASAL specimens only), is one component of a comprehensive MRSA colonization surveillance program. It is not intended to diagnose MRSA infection nor to guide or monitor treatment for MRSA infections.   MRSA culture     Status: None (Preliminary result)   Collection Time: 03/14/16  1:05 PM  Result Value Ref Range Status   Specimen Description NOSE  Final   Special Requests NONE  Final    Culture   Final    CULTURE REINCUBATED FOR BETTER GROWTH Performed at [facility]    Report Status PENDING  Incomplete  MRSA PCR Screening     Status: None   Collection Time: 03/15/16  2:00 PM  Result Value Ref Range Status   MRSA by PCR NEGATIVE NEGATIVE Final    Comment: DELTA CHECK NOTED        The GeneXpert MRSA Assay (FDA approved for NASAL specimens only), is one component of a comprehensive MRSA colonization surveillance program. It is not intended to diagnose MRSA infection nor to guide or monitor treatment for MRSA infections.          Radiology Studies: No results found.      Scheduled Meds: . ALPRAZolam  0.25 mg Oral BID  . aztreonam  1 g Intravenous Q8H  . enoxaparin (LOVENOX) injection  40 mg Subcutaneous Q24H  . guaiFENesin  600 mg Oral BID  . loratadine  10 mg Oral Daily  . losartan  25 mg Oral Daily  . meloxicam  15 mg Oral Daily  . mometasone-formoterol  2 puff Inhalation BID  . multivitamin with minerals  1 tablet Oral Daily  . nitroGLYCERIN  0.4 mg Sublingual UD  . pantoprazole  40 mg Oral Daily  . polyethylene glycol  17 g Oral Daily  . polyvinyl alcohol  1 drop Both Eyes TID  . pravastatin  40 mg Oral QHS  . sodium chloride flush  3 mL Intravenous Q12H  . venlafaxine XR  75 mg Oral Daily   Continuous Infusions:    LOS: 2 days    Time spent: 30 minutes     Elliot Cousin, MD [facility] Pager [phone]  If 7PM-7AM, please contact night-coverage [email or web address] Password [facility] 03/16/2016, 2:08 PM

## 2016-03-16 NOTE — Care Management Note (Signed)
Case Management Note  Patient Details  Name: MAKINSEY PEPITONE MRN: [id number] Date of Birth: [birth year]  Expected Discharge Date:        03/17/2016          Expected Discharge Plan:  Skilled Nursing Facility  In-House Referral:  Clinical Social Work  Discharge planning Services  CM Consult  Post Acute Care Choice:    Choice offered to:     DME Arranged:    DME Agency:     HH Arranged:    HH Agency:     Status of Service:  Completed, signed off  If discussed at [employer], dates discussed:    Additional Comments: Patient has been weaned to room air. Plans to return to [facility] at discharge, CSW aware and making arrangements.   Tahj Lindseth, Chrystine Oiler, RN 03/16/2016, 2:29 PM

## 2016-03-17 DIAGNOSIS — H3530 Unspecified macular degeneration: Secondary | ICD-10-CM | POA: Diagnosis not present

## 2016-03-17 DIAGNOSIS — J449 Chronic obstructive pulmonary disease, unspecified: Secondary | ICD-10-CM | POA: Diagnosis not present

## 2016-03-17 DIAGNOSIS — R278 Other lack of coordination: Secondary | ICD-10-CM | POA: Diagnosis not present

## 2016-03-17 DIAGNOSIS — I4891 Unspecified atrial fibrillation: Secondary | ICD-10-CM | POA: Diagnosis not present

## 2016-03-17 DIAGNOSIS — B351 Tinea unguium: Secondary | ICD-10-CM | POA: Diagnosis not present

## 2016-03-17 DIAGNOSIS — I259 Chronic ischemic heart disease, unspecified: Secondary | ICD-10-CM | POA: Diagnosis not present

## 2016-03-17 DIAGNOSIS — H548 Legal blindness, as defined in USA: Secondary | ICD-10-CM | POA: Diagnosis not present

## 2016-03-17 DIAGNOSIS — R279 Unspecified lack of coordination: Secondary | ICD-10-CM | POA: Diagnosis not present

## 2016-03-17 DIAGNOSIS — R1314 Dysphagia, pharyngoesophageal phase: Secondary | ICD-10-CM | POA: Diagnosis not present

## 2016-03-17 DIAGNOSIS — F329 Major depressive disorder, single episode, unspecified: Secondary | ICD-10-CM | POA: Diagnosis not present

## 2016-03-17 DIAGNOSIS — K839 Disease of biliary tract, unspecified: Secondary | ICD-10-CM | POA: Diagnosis not present

## 2016-03-17 DIAGNOSIS — E876 Hypokalemia: Secondary | ICD-10-CM | POA: Diagnosis not present

## 2016-03-17 DIAGNOSIS — N390 Urinary tract infection, site not specified: Secondary | ICD-10-CM | POA: Diagnosis not present

## 2016-03-17 DIAGNOSIS — R600 Localized edema: Secondary | ICD-10-CM | POA: Diagnosis not present

## 2016-03-17 DIAGNOSIS — M79675 Pain in left toe(s): Secondary | ICD-10-CM | POA: Diagnosis not present

## 2016-03-17 DIAGNOSIS — F419 Anxiety disorder, unspecified: Secondary | ICD-10-CM | POA: Diagnosis not present

## 2016-03-17 DIAGNOSIS — E782 Mixed hyperlipidemia: Secondary | ICD-10-CM | POA: Diagnosis not present

## 2016-03-17 DIAGNOSIS — E784 Other hyperlipidemia: Secondary | ICD-10-CM | POA: Diagnosis not present

## 2016-03-17 DIAGNOSIS — W010XXD Fall on same level from slipping, tripping and stumbling without subsequent striking against object, subsequent encounter: Secondary | ICD-10-CM | POA: Diagnosis not present

## 2016-03-17 DIAGNOSIS — R2689 Other abnormalities of gait and mobility: Secondary | ICD-10-CM | POA: Diagnosis not present

## 2016-03-17 DIAGNOSIS — R260 Ataxic gait: Secondary | ICD-10-CM | POA: Diagnosis not present

## 2016-03-17 DIAGNOSIS — M069 Rheumatoid arthritis, unspecified: Secondary | ICD-10-CM | POA: Diagnosis not present

## 2016-03-17 DIAGNOSIS — Z23 Encounter for immunization: Secondary | ICD-10-CM | POA: Diagnosis not present

## 2016-03-17 DIAGNOSIS — K210 Gastro-esophageal reflux disease with esophagitis: Secondary | ICD-10-CM | POA: Diagnosis not present

## 2016-03-17 DIAGNOSIS — I2510 Atherosclerotic heart disease of native coronary artery without angina pectoris: Secondary | ICD-10-CM | POA: Diagnosis not present

## 2016-03-17 DIAGNOSIS — R531 Weakness: Secondary | ICD-10-CM | POA: Diagnosis not present

## 2016-03-17 DIAGNOSIS — M159 Polyosteoarthritis, unspecified: Secondary | ICD-10-CM | POA: Diagnosis not present

## 2016-03-17 DIAGNOSIS — J189 Pneumonia, unspecified organism: Secondary | ICD-10-CM | POA: Diagnosis not present

## 2016-03-17 DIAGNOSIS — R05 Cough: Secondary | ICD-10-CM | POA: Diagnosis not present

## 2016-03-17 DIAGNOSIS — I214 Non-ST elevation (NSTEMI) myocardial infarction: Secondary | ICD-10-CM | POA: Diagnosis not present

## 2016-03-17 DIAGNOSIS — I10 Essential (primary) hypertension: Secondary | ICD-10-CM | POA: Diagnosis not present

## 2016-03-17 LAB — BASIC METABOLIC PANEL
Anion gap: 6 (ref 5–15)
BUN: 15 mg/dL (ref 6–20)
CHLORIDE: 103 mmol/L (ref 101–111)
CO2: 29 mmol/L (ref 22–32)
Calcium: 8.1 mg/dL — ABNORMAL LOW (ref 8.9–10.3)
GLUCOSE: 86 mg/dL (ref 65–99)
Potassium: 3.8 mmol/L (ref 3.5–5.1)
SODIUM: 138 mmol/L (ref 135–145)

## 2016-03-17 LAB — CBC
HCT: 32.4 % — ABNORMAL LOW (ref 36.0–46.0)
HEMOGLOBIN: 10.3 g/dL — AB (ref 12.0–15.0)
MCH: 32.5 pg (ref 26.0–34.0)
MCHC: 31.8 g/dL (ref 30.0–36.0)
MCV: 102.2 fL — ABNORMAL HIGH (ref 78.0–100.0)
Platelets: 271 10*3/uL (ref 150–400)
RBC: 3.17 MIL/uL — ABNORMAL LOW (ref 3.87–5.11)
RDW: 13.1 % (ref 11.5–15.5)
WBC: 7.4 10*3/uL (ref 4.0–10.5)

## 2016-03-17 LAB — URINE CULTURE

## 2016-03-17 LAB — MRSA CULTURE: CULTURE: NEGATIVE

## 2016-03-17 MED ORDER — TRAMADOL HCL 50 MG PO TABS: 50.0000 mg | ORAL_TABLET | Freq: Two times a day (BID) | ORAL | 0 refills | Status: DC | PRN

## 2016-03-17 MED ORDER — LEVOFLOXACIN 500 MG PO TABS: 500.0000 mg | ORAL_TABLET | Freq: Every day | ORAL | 0 refills | Status: DC

## 2016-03-17 MED ORDER — FUROSEMIDE 40 MG PO TABS: 20.0000 mg | ORAL_TABLET | Freq: Two times a day (BID) | ORAL | 0 refills | Status: DC

## 2016-03-17 NOTE — Clinical Social Work Note (Signed)
Pt d/c today back to [facility]. Pt and facility aware and agreeable. CSW left voicemail for son, Jonny Ruiz requesting return call. Discussed transportation with pt and she requests for facility to pick her up. She is aware [facility] does not have working air conditioning.   Derenda Fennel, LCSW [phone]

## 2016-03-17 NOTE — Progress Notes (Signed)
Patient to be transferred back to [facility] this afternoon, report called and given to [place] at facility.  Patient aware of plans to DC.  IV removed - WNL.

## 2016-03-17 NOTE — Progress Notes (Signed)
Pt in stable condition. Pt assisted into clothing for transfer via [facility] shuttle. PT IV removed with cathter intact. PT transported via wheelchair down. Pt belongings packed and sent with her. Megan Bullins RN called report to receiving nurse that the [facility]. PT pleasant and understands where she is going and is stable upon transport.

## 2016-03-17 NOTE — Discharge Summary (Signed)
Physician Discharge Summary  Candice Meza TFT:[id number] DOB: [birth year] DOA: 03/14/2016  PCP: Candice Pandy, MD  Admit date: 03/14/2016 Discharge date: 03/17/2016   Time spent: Greater than 30 minutes  Recommendations for Outpatient Follow-up:  1. Legionella antigen was pending at the time of discharge. 2. Recommend follow-up of patient's serum potassium.  3. Recommend reassessment of Lasix dosing as the dose was decreased. 4. Recommend that the patient follow-up with her PCP-Dr. Neita Meza and cardiologist-Dr. Wyline Meza as scheduled or in 1-2 weeks.     Discharge Diagnoses:  1. Healthcare associated pneumonia. 2. Urinary tract infection. 3. Chronic lower extremity edema, not present during the hospitalization. 4. Essential hypertension. 5. Hypokalemia. 6. Severe rheumatoid arthritis versus severe degenerative joint disease. 7. Generalized weakness.  Discharge Condition: Improved.  Diet recommendation: Heart healthy.  Filed Weights   03/14/16 0842 03/14/16 1244  Weight: 48.5 kg (107 lb) 48.7 kg (107 lb 5.8 oz)    History of present illness:  Patient is a 82 year old woman with CAD, HTN, severe arthritis DJD versus RA, who [facility] a complaint of generalized weakness, cough, shortness of breath. Reportedly, her oxygen saturation was in the 80s at the SNF. In the ED, she was febrile with a temperature of 103.2. She was hemodynamically stable. She was oxygenating 94% on 3 L nasal cannula oxygen. Her urinalysis was consistent with a UTI. Her chest x-ray revealed low lung volumes. Her W BC was 24.3. She was admitted for further evaluation and management of pneumonia and UTI.  Hospital Course:  1. Healthcare associated pneumonia. Due to the patient's presenting symptomatology, she was started on treatment for pneumonia, despite the findings of the chest x-ray. Vancomycin and aztreonam were started. A number studies were ordered. Strep pneumo antigen was  negative and Legionella antigen was still pending. HIV was nonreactive. Blood cultures were negative to date. The patient improved clinically and symptomatically. Her follow-up chest x-ray revealed low lung volumes. Her white blood cell count completely normalized. She received 4 days of IV antibiotics and was discharged on 3 more days of oral Levaquin.  Acute respiratory failure with hypoxia. Reportedly, the patient's oxygen saturation saturations were in the 80s at the SNF. She was started on oxygen supplementation. Her oxygen saturation has improved. She is oxygenating between 91 and 97% on room air.  Urinary tract infection. Patient's urinalysis on admission revealed too numerous to count WBCs. Antibiotics as above were started. Urine culture ordered and sent and was still pending at the time of discharge. She was discharged on Levaquin empirically for both pneumonia and a UTI.  Essential hypertension/CAD. Patient is treated chronically Cozaar and pravastatin. She is intolerant to aspirin. She is followed by cardiologist Dr. Wyline Meza.  Her blood pressure was on the low normal side on admission, but it trended up, so Cozaar was restarted.  Chronic lower extremity edema. The patient is treated with Lasix chronically. Recently, her cardiologist increase the dose to 40 mg twice a day. It was withheld on admission due to low-normal blood pressures and infections. She was gently hydrated. She had no significant lower extremity edema during the hospitalization. -Lasix was eventually restarted at a lower dose, due to virtually no edema and low-normal blood pressures. -She reports a follow-up appointment with Dr. Wyline Meza in a couple weeks. Hopefully the lower extremity edema and adjustment in Lasix dosing can be reassessed at that time.  Hypokalemia. Patient's serum potassium was 3.3 on admission. She was supplemented and repleted. Her serum potassium has improved and  was 3.8 at the time of discharge.  She was discharged on 20 mEq daily-her home dose.  Generalized weakness. For evaluation, B12 and TSH were ordered. They were both within normal limits. Her generalized weakness is chronic, but likely exacerbated by the infections. She is chronically wheelchair bound from her severe arthritis.  Procedures:  None  Consultations:  None  Discharge Exam: Vitals:   03/16/16 2125 03/17/16 0551  BP: (!) 100/54 104/60  Pulse: 69 61  Resp: 16 18  Temp: 98.8 F (37.1 C) 98 F (36.7 C)   Oxygen saturation 91% to 97% on room air. General exam: Appears calm and comfortable  Respiratory system: A few/occasional crackles; decreased from yesterday.  Respiratory effort normal. Cardiovascular system: S1 & S2 with 1 to 2/6 systolic murmur No pedal edema. Gastrointestinal system: Abdomen is nondistended, soft and nontender. No organomegaly or masses felt. Normal bowel sounds heard. Central nervous system: Alert and oriented. No focal neurological deficits. Extremities: Severe arthritic changes in her hands with rheumatoid hypertrophic changes in the MCP and DIP joints. Skin: No rashes, lesions or ulcers Psychiatry: Judgement and insight appear normal. Meza & affect appropriate.  Discharge Instructions   Discharge Instructions    Diet - low sodium heart healthy    Complete by:  As directed    Increase activity slowly    Complete by:  As directed      Current Discharge Medication List    START taking these medications   Details  levofloxacin (LEVAQUIN) 500 MG tablet Take 1 tablet (500 mg total) by mouth daily. Antibiotic to be taken for 3 more days, starting on 03/18/16. Qty: 3 tablet, Refills: 0      CONTINUE these medications which have CHANGED   Details  furosemide (LASIX) 40 MG tablet Take 0.5 tablets (20 mg total) by mouth 2 (two) times daily. Qty: 30 tablet, Refills: 0    traMADol (ULTRAM) 50 MG tablet Take 1 tablet (50 mg total) by mouth every 12 (twelve) hours as needed. For  pain Qty: 30 tablet, Refills: 0      CONTINUE these medications which have NOT CHANGED   Details  acetaminophen (TYLENOL) 500 MG tablet Take 500 mg by mouth 2 (two) times daily.     albuterol (ACCUNEB) 1.25 MG/3ML nebulizer solution Take 1 ampule by nebulization every 4 (four) hours as needed for wheezing.    ALPRAZolam (XANAX) 0.25 MG tablet Take 0.25 mg by mouth 2 (two) times daily. For anxiety    alum & mag hydroxide-simeth (MYLANTA) 200-200-20 MG/5ML suspension Take 15 mLs by mouth at bedtime.    benzonatate (TESSALON) 200 MG capsule Take 200 mg by mouth 3 (three) times daily as needed for cough.    calcium-vitamin D (OSCAL WITH D) 500-200 MG-UNIT tablet Take 1 tablet by mouth daily.    dextromethorphan (DELSYM) 30 MG/5ML liquid Take 60 mg by mouth 2 (two) times daily as needed. For  cough    fluticasone (FLONASE) 50 MCG/ACT nasal spray Place 2 sprays into the nose daily.      guaifenesin (HUMIBID E) 400 MG TABS tablet Take 600 mg by mouth 2 (two) times daily.     guar gum powder Take 12 g by mouth 2 (two) times daily. In 8 oz of water    hydrocortisone (ANUSOL-HC) 2.5 % rectal cream Place 1 application rectally as needed for hemorrhoids or itching.    INCRUSE ELLIPTA 62.5 MCG/INH AEPB Inhale 1 Inhaler into the lungs daily.    KLOR-CON M20 20  MEQ tablet Take 1 tablet by mouth daily.    loratadine (CLARITIN) 10 MG tablet Take 10 mg by mouth daily.    losartan (COZAAR) 25 MG tablet Take 1 tablet by mouth daily.    meloxicam (MOBIC) 15 MG tablet Take 15 mg by mouth daily.    Multiple Vitamin (MULTIVITAMIN) tablet Take 1 tablet by mouth daily.      nitroGLYCERIN (NITROSTAT) 0.4 MG SL tablet Place 0.4 mg under the tongue as directed.      omeprazole (PRILOSEC) 20 MG capsule Take 1 capsule by mouth daily.    Polyethyl Glycol-Propyl Glycol (SYSTANE) 0.4-0.3 % SOLN Apply 1 drop to eye 3 (three) times daily.      polyethylene glycol powder (GLYCOLAX/MIRALAX) powder Take 17 g  by mouth daily.    pravastatin (PRAVACHOL) 40 MG tablet Take 40 mg by mouth at bedtime.      promethazine (PHENERGAN) 25 MG tablet Take 12.5 mg by mouth every 4 (four) hours as needed. For nausea    SYMBICORT 160-4.5 MCG/ACT inhaler Inhale 2 Inhalers into the lungs 2 (two) times daily.    venlafaxine XR (EFFEXOR-XR) 75 MG 24 hr capsule Take 1 capsule by mouth daily.       Allergies  Allergen Reactions  . Aspirin     GI symptoms  . Bee Venom   . Morphine And Related   . Sulfa Antibiotics     dyspnea  . Tetracyclines & Related   . Codeine Rash  . Pamelor Rash  . Penicillins Rash   Follow-up Information    Candice Pandy, MD. Schedule an appointment as soon as possible for a visit in 1 week(s).   Specialty:  Family Medicine Contact information: [address] [place] [address] [phone]            The results of significant diagnostics from this hospitalization (including imaging, microbiology, ancillary and laboratory) are listed below for reference.    Significant Diagnostic Studies: Dg Chest 2 View  Result Date: 03/14/2016 CLINICAL DATA:  Cough and fever and shortness of breath EXAM: CHEST  2 VIEW COMPARISON:  07/02/2015 FINDINGS: Aortic atherosclerosis identified. There are decreased lung volumes with asymmetric elevation of the left hemidiaphragm. Atelectasis is noted within both lung bases. IMPRESSION: 1. Low lung volumes. 2. Bibasilar atelectasis. 3. Aortic atherosclerosis. Electronically Signed   By: Signa Kell M.D.   On: 03/14/2016 09:33   Dg Chest Port 1 View  Result Date: 03/16/2016 CLINICAL DATA:  82 year old female with healthcare acquired pneumonia EXAM: PORTABLE CHEST 1 VIEW COMPARISON:  Prior chest x-ray 03/14/2016 FINDINGS: Persistent very low inspiratory volumes with chronic elevation of the left hemidiaphragm. Linear atelectasis present in the lingula and bilateral lower lobes. Large hiatal hernia. Cardiac and mediastinal contours remain within  normal limits. Atherosclerotic calcifications present in the transverse aorta. No pulmonary edema, pleural effusion or pneumothorax. No acute osseous abnormality. IMPRESSION: 1. Stable chest x-ray without evidence of acute cardiopulmonary process. Similar low inspiratory volumes, chronic elevation of the left hemidiaphragm and bibasilar atelectasis. 2. Very large hiatal hernia again noted. 3.  Aortic Atherosclerosis (ICD10-170.0). Electronically Signed   By: Malachy Moan M.D.   On: 03/16/2016 17:07    Microbiology: Recent Results (from the past 240 hour(s))  Urine culture     Status: Abnormal   Collection Time: 03/14/16  9:10 AM  Result Value Ref Range Status   Specimen Description URINE, CLEAN CATCH  Final   Special Requests NONE  Final   Culture MULTIPLE  SPECIES PRESENT, SUGGEST RECOLLECTION (A)  Final   Report Status 03/15/2016 FINAL  Final  Blood Culture (routine x 2)     Status: None (Preliminary result)   Collection Time: 03/14/16  9:56 AM  Result Value Ref Range Status   Specimen Description BLOOD LEFT HAND  Final   Special Requests BOTTLES DRAWN AEROBIC AND ANAEROBIC 5CC  Final   Culture NO GROWTH 2 DAYS  Final   Report Status PENDING  Incomplete  Blood Culture (routine x 2)     Status: None (Preliminary result)   Collection Time: 03/14/16  9:56 AM  Result Value Ref Range Status   Specimen Description BLOOD LEFT FOREARM  Final   Special Requests BOTTLES DRAWN AEROBIC ONLY 4CC  Final   Culture NO GROWTH 2 DAYS  Final   Report Status PENDING  Incomplete  MRSA PCR Screening     Status: Abnormal   Collection Time: 03/14/16  1:05 PM  Result Value Ref Range Status   MRSA by PCR (A) NEGATIVE Final    INVALID, UNABLE TO DETERMINE THE PRESENCE OF TARGET DNA DUE TO SPECIMEN INTEGRITY. RECOLLECTION REQUESTED.    Comment: CRITICAL RESULT CALLED TO, READ BACK BY AND VERIFIED WITH: MORRIS C AT 0529 ON [id number] BY FORSYTH K        The GeneXpert MRSA Assay (FDA approved for NASAL  specimens only), is one component of a comprehensive MRSA colonization surveillance program. It is not intended to diagnose MRSA infection nor to guide or monitor treatment for MRSA infections.   MRSA culture     Status: None (Preliminary result)   Collection Time: 03/14/16  1:05 PM  Result Value Ref Range Status   Specimen Description NOSE  Final   Special Requests NONE  Final   Culture   Final    CULTURE REINCUBATED FOR BETTER GROWTH [facility]    Report Status PENDING  Incomplete  MRSA PCR Screening     Status: None   Collection Time: 03/15/16  2:00 PM  Result Value Ref Range Status   MRSA by PCR NEGATIVE NEGATIVE Final    Comment: DELTA CHECK NOTED        The GeneXpert MRSA Assay (FDA approved for NASAL specimens only), is one component of a comprehensive MRSA colonization surveillance program. It is not intended to diagnose MRSA infection nor to guide or monitor treatment for MRSA infections.   Urine culture     Status: Abnormal   Collection Time: 03/15/16  7:27 PM  Result Value Ref Range Status   Specimen Description URINE, CLEAN CATCH  Final   Special Requests NONE  Final   Culture MULTIPLE SPECIES PRESENT, SUGGEST RECOLLECTION (A)  Final   Report Status 03/17/2016 FINAL  Final     Labs: Basic Metabolic Panel:  Recent Labs Lab 03/14/16 0840 03/14/16 2043 03/15/16 0541 03/17/16 0601  NA 140  --  139 138  K 3.3*  --  3.8 3.8  CL 95*  --  105 103  CO2 32  --  28 29  GLUCOSE 113*  --  99 86  BUN 24*  --  20 15  CREATININE 0.49 0.37* 0.35* <0.30*  CALCIUM 9.1  --  8.5* 8.1*  MG  --  2.2  --   --    Liver Function Tests:  Recent Labs Lab 03/14/16 0840  AST 22  ALT 14  ALKPHOS 55  BILITOT 0.5  PROT 7.1  ALBUMIN 4.0   No results for input(s):  LIPASE, AMYLASE in the last 168 hours. No results for input(s): AMMONIA in the last 168 hours. CBC:  Recent Labs Lab 03/14/16 0840 03/14/16 2043 03/15/16 0541 03/17/16 0601   WBC 24.3* 17.3* 11.6* 7.4  NEUTROABS 22.3*  --   --   --   HGB 13.0 10.9* 10.7* 10.3*  HCT 40.5 33.6* 33.6* 32.4*  MCV 100.2* 100.0 103.1* 102.2*  PLT 327 266 266 271   Cardiac Enzymes:  Recent Labs Lab 03/14/16 0956  TROPONINI 0.04*   BNP: BNP (last 3 results)  Recent Labs  03/14/16 0840  BNP 50.0    ProBNP (last 3 results) No results for input(s): PROBNP in the last 8760 hours.  CBG: No results for input(s): GLUCAP in the last 168 hours.     Signed:  Erma Joubert MD.  [facility] 03/17/2016, 11:43 AM

## 2016-03-18 DIAGNOSIS — J189 Pneumonia, unspecified organism: Secondary | ICD-10-CM | POA: Diagnosis not present

## 2016-03-18 LAB — LEGIONELLA PNEUMOPHILA SEROGP 1 UR AG: L. pneumophila Serogp 1 Ur Ag: NEGATIVE

## 2016-03-21 LAB — CULTURE, BLOOD (ROUTINE X 2)
CULTURE: NO GROWTH
Culture: NO GROWTH

## 2016-04-01 DIAGNOSIS — J189 Pneumonia, unspecified organism: Secondary | ICD-10-CM | POA: Diagnosis not present

## 2016-04-19 ENCOUNTER — Encounter: Payer: Self-pay | Admitting: Cardiology

## 2016-04-19 ENCOUNTER — Ambulatory Visit (INDEPENDENT_AMBULATORY_CARE_PROVIDER_SITE_OTHER): Payer: Medicare Other | Admitting: Cardiology

## 2016-04-19 VITALS — BP 103/61 | HR 70 | Ht 59.0 in | Wt 104.4 lb

## 2016-04-19 DIAGNOSIS — I2510 Atherosclerotic heart disease of native coronary artery without angina pectoris: Secondary | ICD-10-CM

## 2016-04-19 DIAGNOSIS — I10 Essential (primary) hypertension: Secondary | ICD-10-CM

## 2016-04-19 DIAGNOSIS — E782 Mixed hyperlipidemia: Secondary | ICD-10-CM

## 2016-04-19 DIAGNOSIS — R600 Localized edema: Secondary | ICD-10-CM

## 2016-04-19 NOTE — Patient Instructions (Signed)

## 2016-04-19 NOTE — Progress Notes (Signed)
Clinical Summary Candice Meza is a 82 y.o.female seen today for follow up of the following medical problems.  1. CAD  - prior MI in 1997, non-obstructive disease by cath at that time.  - echo 2008 LVEF 60%, mild AI  - history of ASA allergy, has not been taking   - denies any chest pain.No recent SOB or DOE  2. HTN  -she is compliant with meds   3. Hyperlipidemia  - compliant statin  4. LE edema - last visit we increased lasix to 60mg , symptoms not imrpoved.  - lasix decreased during recent hospital admission with pneumonia.  Mild uptrend in BUN and Cr after she was increased to 40mg  bid.  - no recent issues with swelling  5. Pneumonia - admission 03/2016 with pneumonia. - continuing to recover Past Medical History:  Diagnosis Date  . Angina pectoris (HCC)   . Anxiety   . Ataxic gait   . Atherosclerotic heart disease   . Atrial fibrillation (HCC)   . Benign essential tremor   . COPD (chronic obstructive pulmonary disease) (HCC)   . Cough    Prolonged, possible asthmatic bronchitis  . Degenerative joint disease    versus rheumatoid arthritis  . Depression   . Disease of biliary tract   . Dysphagia   . GERD (gastroesophageal reflux disease)   . Hyperlipidemia   . Hypertension   . Hypokalemia   . Macular degeneration   . MI, acute, non ST segment elevation (HCC) 1997   Questionable lesion in the circumflex at catheterization; normal EF; chronic dyspnea without specific cause identified  . Osteopenia    Versus osteoporosis  . Postmenopausal    Estrogen replacement therapy  . Repeated falls   . Weakness      Allergies  Allergen Reactions  . Aspirin     GI symptoms  . Bee Venom   . Morphine And Related   . Sulfa Antibiotics     dyspnea  . Tetracyclines & Related   . Codeine Rash  . Pamelor Rash  . Penicillins Rash     Current Outpatient Prescriptions  Medication Sig Dispense Refill  . acetaminophen (TYLENOL) 500 MG tablet Take 500  mg by mouth 2 (two) times daily.     [name] albuterol (ACCUNEB) 1.25 MG/3ML nebulizer solution Take 1 ampule by nebulization every 4 (four) hours as needed for wheezing.    [name] ALPRAZolam (XANAX) 0.25 MG tablet Take 0.25 mg by mouth 2 (two) times daily. For anxiety    . alum & mag hydroxide-simeth (MYLANTA) 200-200-20 MG/5ML suspension Take 15 mLs by mouth at bedtime.    . benzonatate (TESSALON) 200 MG capsule Take 200 mg by mouth 3 (three) times daily as needed for cough.    . calcium-vitamin D (OSCAL WITH D) 500-200 MG-UNIT tablet Take 1 tablet by mouth daily.    [name] dextromethorphan (DELSYM) 30 MG/5ML liquid Take 60 mg by mouth 2 (two) times daily as needed. For  cough    . fluticasone (FLONASE) 50 MCG/ACT nasal spray Place 2 sprays into the nose daily.      . furosemide (LASIX) 40 MG tablet Take 0.5 tablets (20 mg total) by mouth 2 (two) times daily. 30 tablet 0  . guaifenesin (HUMIBID E) 400 MG TABS tablet Take 600 mg by mouth 2 (two) times daily.     [name] guar gum powder Take 12 g by mouth 2 (two) times daily. In 8 oz of water    . hydrocortisone (  ANUSOL-HC) 2.5 % rectal cream Place 1 application rectally as needed for hemorrhoids or itching.    . INCRUSE ELLIPTA 62.5 MCG/INH AEPB Inhale 1 Inhaler into the lungs daily.    [name] KLOR-CON M20 20 MEQ tablet Take 1 tablet by mouth daily.    [name] levofloxacin (LEVAQUIN) 500 MG tablet Take 1 tablet (500 mg total) by mouth daily. Antibiotic to be taken for 3 more days, starting on 03/18/16. 3 tablet 0  . loratadine (CLARITIN) 10 MG tablet Take 10 mg by mouth daily.    [name] losartan (COZAAR) 25 MG tablet Take 1 tablet by mouth daily.    . meloxicam (MOBIC) 15 MG tablet Take 15 mg by mouth daily.    . Multiple Vitamin (MULTIVITAMIN) tablet Take 1 tablet by mouth daily.      . nitroGLYCERIN (NITROSTAT) 0.4 MG SL tablet Place 0.4 mg under the tongue as directed.      [name] omeprazole (PRILOSEC) 20 MG capsule Take 1 capsule by mouth daily.    Bertram Gala Glycol-Propyl Glycol  (SYSTANE) 0.4-0.3 % SOLN Apply 1 drop to eye 3 (three) times daily.      . polyethylene glycol powder (GLYCOLAX/MIRALAX) powder Take 17 g by mouth daily.    . pravastatin (PRAVACHOL) 40 MG tablet Take 40 mg by mouth at bedtime.      . promethazine (PHENERGAN) 25 MG tablet Take 12.5 mg by mouth every 4 (four) hours as needed. For nausea    . SYMBICORT 160-4.5 MCG/ACT inhaler Inhale 2 Inhalers into the lungs 2 (two) times daily.    . traMADol (ULTRAM) 50 MG tablet Take 1 tablet (50 mg total) by mouth every 12 (twelve) hours as needed. For pain 30 tablet 0  . venlafaxine XR (EFFEXOR-XR) 75 MG 24 hr capsule Take 1 capsule by mouth daily.     No current facility-administered medications for this visit.      Past Surgical History:  Procedure Laterality Date  . BLADDER SUSPENSION    . ERCP  04/18/2012   Procedure: ENDOSCOPIC RETROGRADE CHOLANGIOPANCREATOGRAPHY (ERCP);  Surgeon: Malissa Hippo, MD;  Location: [facility];  Service: Endoscopy;  Laterality: N/A;  Diagnostic  . FOOT SURGERY     Bilateral  . INGUINAL HERNIA REPAIR     Left     Allergies  Allergen Reactions  . Aspirin     GI symptoms  . Bee Venom   . Morphine And Related   . Sulfa Antibiotics     dyspnea  . Tetracyclines & Related   . Codeine Rash  . Pamelor Rash  . Penicillins Rash      Family History  Problem Relation Age of Onset  . Coronary artery disease    . Stroke       Social History Candice Meza reports that she has never smoked. She has never used smokeless tobacco. Candice Meza reports that she does not drink alcohol.   Review of Systems CONSTITUTIONAL: No weight loss, fever, chills, weakness or fatigue.  HEENT: Eyes: No visual loss, blurred vision, double vision or yellow sclerae.No hearing loss, sneezing, congestion, runny nose or sore throat.  SKIN: No rash or itching.  CARDIOVASCULAR: per HPI RESPIRATORY: +cough GASTROINTESTINAL: No anorexia, nausea, vomiting or diarrhea. No abdominal pain  or blood.  GENITOURINARY: No burning on urination, no polyuria NEUROLOGICAL: No headache, dizziness, syncope, paralysis, ataxia, numbness or tingling in the extremities. No change in bowel or bladder control.  MUSCULOSKELETAL: No muscle, back pain, joint pain or stiffness.  LYMPHATICS:  No enlarged nodes. No history of splenectomy.  PSYCHIATRIC: No history of depression or anxiety.  ENDOCRINOLOGIC: No reports of sweating, cold or heat intolerance. No polyuria or polydipsia.  [name]   Physical Examination Vitals:   04/19/16 0859  BP: 103/61  Pulse: 70   Vitals:   04/19/16 0859  Weight: 104 lb 6.4 oz (47.4 kg)  Height: 4\' 11"  (1.499 m)    Gen: resting comfortably, no acute distress HEENT: no scleral icterus, pupils equal round and reactive, no palptable cervical adenopathy,  CV: RRR, 2/6 systolic murmur RUSB, no jvd Resp: Clear to auscultation bilaterally GI: abdomen is soft, non-tender, non-distended, normal bowel sounds, no hepatosplenomegaly MSK: extremities are warm, no edema.  Skin: warm, no rash Neuro:  no focal deficits Psych: appropriate affect   Diagnostic Studies 10/29/13 Echo Study Conclusions  - Left ventricle: The cavity size was normal. Wall thickness was increased in a pattern of mild LVH. Systolic function was normal. The estimated ejection fraction was in the range of 55% to 60%. Wall motion was normal; there were no regional wall motion abnormalities. There was an increased relative contribution of atrial contraction to ventricular filling. Doppler parameters are consistent with abnormal left ventricular relaxation (grade 1 diastolic dysfunction). - Aortic valve: Mildly calcified annulus. Trileaflet; mildly thickened, mildly calcified leaflets. Moderate, centralregurgitation. Minimal aortic stenosis. Peak velocity: 205cm/s (S). Mean gradient: 19mm Hg (S). Valve area: 1.85cm^2(VTI). - Mitral valve: Calcified annulus. Mildly thickened leaflets . Mild  regurgitation. - Left atrium: The atrium was mildly dilated. - Tricuspid valve: Mildly thickened leaflets. Mild-moderate regurgitation. - Pulmonary arteries: PA peak pressure: 17mm Hg (S). Mild to moderately elevated pulmonary pressures.    Assessment and Plan  1. CAD  - no current symptoms - she will continue current meds  2. HTN  - she is at goal, continue current meds    3. Hyperlipidemia  - at goal - continue statin.  4. LE edema - currently controlled. Given Rx for compression stockings.    F/u 6 months      53m, M.D., F.A.C.C.

## 2016-05-29 DIAGNOSIS — M069 Rheumatoid arthritis, unspecified: Secondary | ICD-10-CM | POA: Diagnosis not present

## 2016-05-30 DIAGNOSIS — G4700 Insomnia, unspecified: Secondary | ICD-10-CM | POA: Diagnosis not present

## 2016-05-30 DIAGNOSIS — F411 Generalized anxiety disorder: Secondary | ICD-10-CM | POA: Diagnosis not present

## 2016-05-30 DIAGNOSIS — F329 Major depressive disorder, single episode, unspecified: Secondary | ICD-10-CM | POA: Diagnosis not present

## 2016-06-15 DIAGNOSIS — I10 Essential (primary) hypertension: Secondary | ICD-10-CM | POA: Diagnosis not present

## 2016-06-15 DIAGNOSIS — K210 Gastro-esophageal reflux disease with esophagitis: Secondary | ICD-10-CM | POA: Diagnosis not present

## 2016-06-15 DIAGNOSIS — M069 Rheumatoid arthritis, unspecified: Secondary | ICD-10-CM | POA: Diagnosis not present

## 2016-06-15 DIAGNOSIS — Z23 Encounter for immunization: Secondary | ICD-10-CM | POA: Diagnosis not present

## 2016-06-15 DIAGNOSIS — J449 Chronic obstructive pulmonary disease, unspecified: Secondary | ICD-10-CM | POA: Diagnosis not present

## 2016-06-17 DIAGNOSIS — Z9849 Cataract extraction status, unspecified eye: Secondary | ICD-10-CM | POA: Diagnosis not present

## 2016-06-17 DIAGNOSIS — H02402 Unspecified ptosis of left eyelid: Secondary | ICD-10-CM | POA: Diagnosis not present

## 2016-06-17 DIAGNOSIS — Z961 Presence of intraocular lens: Secondary | ICD-10-CM | POA: Diagnosis not present

## 2016-06-17 DIAGNOSIS — H3122 Choroidal dystrophy (central areolar) (generalized) (peripapillary): Secondary | ICD-10-CM | POA: Diagnosis not present

## 2016-07-21 DIAGNOSIS — F332 Major depressive disorder, recurrent severe without psychotic features: Secondary | ICD-10-CM | POA: Diagnosis not present

## 2016-07-23 DIAGNOSIS — F332 Major depressive disorder, recurrent severe without psychotic features: Secondary | ICD-10-CM | POA: Diagnosis not present

## 2016-07-24 DIAGNOSIS — M069 Rheumatoid arthritis, unspecified: Secondary | ICD-10-CM | POA: Diagnosis not present

## 2016-07-24 DIAGNOSIS — I482 Chronic atrial fibrillation: Secondary | ICD-10-CM | POA: Diagnosis not present

## 2016-07-26 DIAGNOSIS — F332 Major depressive disorder, recurrent severe without psychotic features: Secondary | ICD-10-CM | POA: Diagnosis not present

## 2016-07-28 DIAGNOSIS — F332 Major depressive disorder, recurrent severe without psychotic features: Secondary | ICD-10-CM | POA: Diagnosis not present

## 2016-08-04 DIAGNOSIS — F332 Major depressive disorder, recurrent severe without psychotic features: Secondary | ICD-10-CM | POA: Diagnosis not present

## 2016-08-09 DIAGNOSIS — M79675 Pain in left toe(s): Secondary | ICD-10-CM | POA: Diagnosis not present

## 2016-08-09 DIAGNOSIS — B351 Tinea unguium: Secondary | ICD-10-CM | POA: Diagnosis not present

## 2016-08-11 DIAGNOSIS — F332 Major depressive disorder, recurrent severe without psychotic features: Secondary | ICD-10-CM | POA: Diagnosis not present

## 2016-08-14 DIAGNOSIS — F332 Major depressive disorder, recurrent severe without psychotic features: Secondary | ICD-10-CM | POA: Diagnosis not present

## 2016-08-21 DIAGNOSIS — F332 Major depressive disorder, recurrent severe without psychotic features: Secondary | ICD-10-CM | POA: Diagnosis not present

## 2016-08-23 DIAGNOSIS — M19041 Primary osteoarthritis, right hand: Secondary | ICD-10-CM | POA: Diagnosis not present

## 2016-08-23 DIAGNOSIS — S60221A Contusion of right hand, initial encounter: Secondary | ICD-10-CM | POA: Diagnosis not present

## 2016-08-25 DIAGNOSIS — R2681 Unsteadiness on feet: Secondary | ICD-10-CM | POA: Diagnosis not present

## 2016-08-25 DIAGNOSIS — R260 Ataxic gait: Secondary | ICD-10-CM | POA: Diagnosis not present

## 2016-08-25 DIAGNOSIS — W010XXD Fall on same level from slipping, tripping and stumbling without subsequent striking against object, subsequent encounter: Secondary | ICD-10-CM | POA: Diagnosis not present

## 2016-08-25 DIAGNOSIS — R278 Other lack of coordination: Secondary | ICD-10-CM | POA: Diagnosis not present

## 2016-08-25 DIAGNOSIS — R2689 Other abnormalities of gait and mobility: Secondary | ICD-10-CM | POA: Diagnosis not present

## 2016-08-25 DIAGNOSIS — I4891 Unspecified atrial fibrillation: Secondary | ICD-10-CM | POA: Diagnosis not present

## 2016-08-25 DIAGNOSIS — R531 Weakness: Secondary | ICD-10-CM | POA: Diagnosis not present

## 2016-08-27 DIAGNOSIS — R531 Weakness: Secondary | ICD-10-CM | POA: Diagnosis not present

## 2016-08-27 DIAGNOSIS — R2681 Unsteadiness on feet: Secondary | ICD-10-CM | POA: Diagnosis not present

## 2016-08-27 DIAGNOSIS — R260 Ataxic gait: Secondary | ICD-10-CM | POA: Diagnosis not present

## 2016-08-27 DIAGNOSIS — F332 Major depressive disorder, recurrent severe without psychotic features: Secondary | ICD-10-CM | POA: Diagnosis not present

## 2016-08-27 DIAGNOSIS — R278 Other lack of coordination: Secondary | ICD-10-CM | POA: Diagnosis not present

## 2016-08-27 DIAGNOSIS — R2689 Other abnormalities of gait and mobility: Secondary | ICD-10-CM | POA: Diagnosis not present

## 2016-08-27 DIAGNOSIS — W010XXD Fall on same level from slipping, tripping and stumbling without subsequent striking against object, subsequent encounter: Secondary | ICD-10-CM | POA: Diagnosis not present

## 2016-08-29 DIAGNOSIS — R260 Ataxic gait: Secondary | ICD-10-CM | POA: Diagnosis not present

## 2016-08-29 DIAGNOSIS — R2681 Unsteadiness on feet: Secondary | ICD-10-CM | POA: Diagnosis not present

## 2016-08-29 DIAGNOSIS — R2689 Other abnormalities of gait and mobility: Secondary | ICD-10-CM | POA: Diagnosis not present

## 2016-08-29 DIAGNOSIS — W010XXD Fall on same level from slipping, tripping and stumbling without subsequent striking against object, subsequent encounter: Secondary | ICD-10-CM | POA: Diagnosis not present

## 2016-08-29 DIAGNOSIS — R531 Weakness: Secondary | ICD-10-CM | POA: Diagnosis not present

## 2016-08-29 DIAGNOSIS — R278 Other lack of coordination: Secondary | ICD-10-CM | POA: Diagnosis not present

## 2016-08-30 DIAGNOSIS — R2689 Other abnormalities of gait and mobility: Secondary | ICD-10-CM | POA: Diagnosis not present

## 2016-08-30 DIAGNOSIS — W010XXD Fall on same level from slipping, tripping and stumbling without subsequent striking against object, subsequent encounter: Secondary | ICD-10-CM | POA: Diagnosis not present

## 2016-08-30 DIAGNOSIS — R2681 Unsteadiness on feet: Secondary | ICD-10-CM | POA: Diagnosis not present

## 2016-08-30 DIAGNOSIS — R260 Ataxic gait: Secondary | ICD-10-CM | POA: Diagnosis not present

## 2016-08-30 DIAGNOSIS — R531 Weakness: Secondary | ICD-10-CM | POA: Diagnosis not present

## 2016-08-30 DIAGNOSIS — R278 Other lack of coordination: Secondary | ICD-10-CM | POA: Diagnosis not present

## 2016-08-31 DIAGNOSIS — R2689 Other abnormalities of gait and mobility: Secondary | ICD-10-CM | POA: Diagnosis not present

## 2016-08-31 DIAGNOSIS — R260 Ataxic gait: Secondary | ICD-10-CM | POA: Diagnosis not present

## 2016-08-31 DIAGNOSIS — R2681 Unsteadiness on feet: Secondary | ICD-10-CM | POA: Diagnosis not present

## 2016-08-31 DIAGNOSIS — R531 Weakness: Secondary | ICD-10-CM | POA: Diagnosis not present

## 2016-08-31 DIAGNOSIS — R278 Other lack of coordination: Secondary | ICD-10-CM | POA: Diagnosis not present

## 2016-08-31 DIAGNOSIS — W010XXD Fall on same level from slipping, tripping and stumbling without subsequent striking against object, subsequent encounter: Secondary | ICD-10-CM | POA: Diagnosis not present

## 2016-09-01 DIAGNOSIS — R531 Weakness: Secondary | ICD-10-CM | POA: Diagnosis not present

## 2016-09-01 DIAGNOSIS — W010XXD Fall on same level from slipping, tripping and stumbling without subsequent striking against object, subsequent encounter: Secondary | ICD-10-CM | POA: Diagnosis not present

## 2016-09-01 DIAGNOSIS — I4891 Unspecified atrial fibrillation: Secondary | ICD-10-CM | POA: Diagnosis not present

## 2016-09-01 DIAGNOSIS — R260 Ataxic gait: Secondary | ICD-10-CM | POA: Diagnosis not present

## 2016-09-01 DIAGNOSIS — R278 Other lack of coordination: Secondary | ICD-10-CM | POA: Diagnosis not present

## 2016-09-01 DIAGNOSIS — R2689 Other abnormalities of gait and mobility: Secondary | ICD-10-CM | POA: Diagnosis not present

## 2016-09-01 DIAGNOSIS — R2681 Unsteadiness on feet: Secondary | ICD-10-CM | POA: Diagnosis not present

## 2016-09-02 DIAGNOSIS — D519 Vitamin B12 deficiency anemia, unspecified: Secondary | ICD-10-CM | POA: Diagnosis not present

## 2016-09-02 DIAGNOSIS — E559 Vitamin D deficiency, unspecified: Secondary | ICD-10-CM | POA: Diagnosis not present

## 2016-09-02 DIAGNOSIS — I10 Essential (primary) hypertension: Secondary | ICD-10-CM | POA: Diagnosis not present

## 2016-09-02 DIAGNOSIS — R770 Abnormality of albumin: Secondary | ICD-10-CM | POA: Diagnosis not present

## 2016-09-02 DIAGNOSIS — E785 Hyperlipidemia, unspecified: Secondary | ICD-10-CM | POA: Diagnosis not present

## 2016-09-02 DIAGNOSIS — R740 Nonspecific elevation of levels of transaminase and lactic acid dehydrogenase [LDH]: Secondary | ICD-10-CM | POA: Diagnosis not present

## 2016-09-02 DIAGNOSIS — E039 Hypothyroidism, unspecified: Secondary | ICD-10-CM | POA: Diagnosis not present

## 2016-09-03 DIAGNOSIS — F332 Major depressive disorder, recurrent severe without psychotic features: Secondary | ICD-10-CM | POA: Diagnosis not present

## 2016-09-05 DIAGNOSIS — R260 Ataxic gait: Secondary | ICD-10-CM | POA: Diagnosis not present

## 2016-09-05 DIAGNOSIS — R531 Weakness: Secondary | ICD-10-CM | POA: Diagnosis not present

## 2016-09-05 DIAGNOSIS — R2689 Other abnormalities of gait and mobility: Secondary | ICD-10-CM | POA: Diagnosis not present

## 2016-09-05 DIAGNOSIS — R278 Other lack of coordination: Secondary | ICD-10-CM | POA: Diagnosis not present

## 2016-09-05 DIAGNOSIS — W010XXD Fall on same level from slipping, tripping and stumbling without subsequent striking against object, subsequent encounter: Secondary | ICD-10-CM | POA: Diagnosis not present

## 2016-09-05 DIAGNOSIS — F332 Major depressive disorder, recurrent severe without psychotic features: Secondary | ICD-10-CM | POA: Diagnosis not present

## 2016-09-05 DIAGNOSIS — I4891 Unspecified atrial fibrillation: Secondary | ICD-10-CM | POA: Diagnosis not present

## 2016-09-06 DIAGNOSIS — R260 Ataxic gait: Secondary | ICD-10-CM | POA: Diagnosis not present

## 2016-09-06 DIAGNOSIS — I4891 Unspecified atrial fibrillation: Secondary | ICD-10-CM | POA: Diagnosis not present

## 2016-09-06 DIAGNOSIS — R2689 Other abnormalities of gait and mobility: Secondary | ICD-10-CM | POA: Diagnosis not present

## 2016-09-06 DIAGNOSIS — R278 Other lack of coordination: Secondary | ICD-10-CM | POA: Diagnosis not present

## 2016-09-06 DIAGNOSIS — W010XXD Fall on same level from slipping, tripping and stumbling without subsequent striking against object, subsequent encounter: Secondary | ICD-10-CM | POA: Diagnosis not present

## 2016-09-06 DIAGNOSIS — R531 Weakness: Secondary | ICD-10-CM | POA: Diagnosis not present

## 2016-09-07 DIAGNOSIS — R2689 Other abnormalities of gait and mobility: Secondary | ICD-10-CM | POA: Diagnosis not present

## 2016-09-07 DIAGNOSIS — R531 Weakness: Secondary | ICD-10-CM | POA: Diagnosis not present

## 2016-09-07 DIAGNOSIS — R278 Other lack of coordination: Secondary | ICD-10-CM | POA: Diagnosis not present

## 2016-09-07 DIAGNOSIS — W010XXD Fall on same level from slipping, tripping and stumbling without subsequent striking against object, subsequent encounter: Secondary | ICD-10-CM | POA: Diagnosis not present

## 2016-09-07 DIAGNOSIS — R260 Ataxic gait: Secondary | ICD-10-CM | POA: Diagnosis not present

## 2016-09-07 DIAGNOSIS — I4891 Unspecified atrial fibrillation: Secondary | ICD-10-CM | POA: Diagnosis not present

## 2016-09-08 DIAGNOSIS — R278 Other lack of coordination: Secondary | ICD-10-CM | POA: Diagnosis not present

## 2016-09-08 DIAGNOSIS — R260 Ataxic gait: Secondary | ICD-10-CM | POA: Diagnosis not present

## 2016-09-08 DIAGNOSIS — R531 Weakness: Secondary | ICD-10-CM | POA: Diagnosis not present

## 2016-09-08 DIAGNOSIS — W010XXD Fall on same level from slipping, tripping and stumbling without subsequent striking against object, subsequent encounter: Secondary | ICD-10-CM | POA: Diagnosis not present

## 2016-09-08 DIAGNOSIS — R2689 Other abnormalities of gait and mobility: Secondary | ICD-10-CM | POA: Diagnosis not present

## 2016-09-08 DIAGNOSIS — I4891 Unspecified atrial fibrillation: Secondary | ICD-10-CM | POA: Diagnosis not present

## 2016-09-09 DIAGNOSIS — R2689 Other abnormalities of gait and mobility: Secondary | ICD-10-CM | POA: Diagnosis not present

## 2016-09-09 DIAGNOSIS — R531 Weakness: Secondary | ICD-10-CM | POA: Diagnosis not present

## 2016-09-09 DIAGNOSIS — W010XXD Fall on same level from slipping, tripping and stumbling without subsequent striking against object, subsequent encounter: Secondary | ICD-10-CM | POA: Diagnosis not present

## 2016-09-09 DIAGNOSIS — R260 Ataxic gait: Secondary | ICD-10-CM | POA: Diagnosis not present

## 2016-09-09 DIAGNOSIS — R278 Other lack of coordination: Secondary | ICD-10-CM | POA: Diagnosis not present

## 2016-09-09 DIAGNOSIS — I4891 Unspecified atrial fibrillation: Secondary | ICD-10-CM | POA: Diagnosis not present

## 2016-09-11 DIAGNOSIS — F332 Major depressive disorder, recurrent severe without psychotic features: Secondary | ICD-10-CM | POA: Diagnosis not present

## 2016-09-12 DIAGNOSIS — R278 Other lack of coordination: Secondary | ICD-10-CM | POA: Diagnosis not present

## 2016-09-12 DIAGNOSIS — R531 Weakness: Secondary | ICD-10-CM | POA: Diagnosis not present

## 2016-09-12 DIAGNOSIS — I4891 Unspecified atrial fibrillation: Secondary | ICD-10-CM | POA: Diagnosis not present

## 2016-09-12 DIAGNOSIS — R260 Ataxic gait: Secondary | ICD-10-CM | POA: Diagnosis not present

## 2016-09-12 DIAGNOSIS — W010XXD Fall on same level from slipping, tripping and stumbling without subsequent striking against object, subsequent encounter: Secondary | ICD-10-CM | POA: Diagnosis not present

## 2016-09-12 DIAGNOSIS — R2689 Other abnormalities of gait and mobility: Secondary | ICD-10-CM | POA: Diagnosis not present

## 2016-09-13 DIAGNOSIS — R531 Weakness: Secondary | ICD-10-CM | POA: Diagnosis not present

## 2016-09-13 DIAGNOSIS — R278 Other lack of coordination: Secondary | ICD-10-CM | POA: Diagnosis not present

## 2016-09-13 DIAGNOSIS — R2689 Other abnormalities of gait and mobility: Secondary | ICD-10-CM | POA: Diagnosis not present

## 2016-09-13 DIAGNOSIS — W010XXD Fall on same level from slipping, tripping and stumbling without subsequent striking against object, subsequent encounter: Secondary | ICD-10-CM | POA: Diagnosis not present

## 2016-09-13 DIAGNOSIS — I4891 Unspecified atrial fibrillation: Secondary | ICD-10-CM | POA: Diagnosis not present

## 2016-09-13 DIAGNOSIS — R260 Ataxic gait: Secondary | ICD-10-CM | POA: Diagnosis not present

## 2016-09-14 DIAGNOSIS — R260 Ataxic gait: Secondary | ICD-10-CM | POA: Diagnosis not present

## 2016-09-14 DIAGNOSIS — W010XXD Fall on same level from slipping, tripping and stumbling without subsequent striking against object, subsequent encounter: Secondary | ICD-10-CM | POA: Diagnosis not present

## 2016-09-14 DIAGNOSIS — R531 Weakness: Secondary | ICD-10-CM | POA: Diagnosis not present

## 2016-09-14 DIAGNOSIS — R278 Other lack of coordination: Secondary | ICD-10-CM | POA: Diagnosis not present

## 2016-09-14 DIAGNOSIS — R2689 Other abnormalities of gait and mobility: Secondary | ICD-10-CM | POA: Diagnosis not present

## 2016-09-14 DIAGNOSIS — I4891 Unspecified atrial fibrillation: Secondary | ICD-10-CM | POA: Diagnosis not present

## 2016-09-15 DIAGNOSIS — R278 Other lack of coordination: Secondary | ICD-10-CM | POA: Diagnosis not present

## 2016-09-15 DIAGNOSIS — R531 Weakness: Secondary | ICD-10-CM | POA: Diagnosis not present

## 2016-09-15 DIAGNOSIS — R2689 Other abnormalities of gait and mobility: Secondary | ICD-10-CM | POA: Diagnosis not present

## 2016-09-15 DIAGNOSIS — W010XXD Fall on same level from slipping, tripping and stumbling without subsequent striking against object, subsequent encounter: Secondary | ICD-10-CM | POA: Diagnosis not present

## 2016-09-15 DIAGNOSIS — R260 Ataxic gait: Secondary | ICD-10-CM | POA: Diagnosis not present

## 2016-09-15 DIAGNOSIS — I4891 Unspecified atrial fibrillation: Secondary | ICD-10-CM | POA: Diagnosis not present

## 2016-09-16 DIAGNOSIS — R278 Other lack of coordination: Secondary | ICD-10-CM | POA: Diagnosis not present

## 2016-09-16 DIAGNOSIS — R2689 Other abnormalities of gait and mobility: Secondary | ICD-10-CM | POA: Diagnosis not present

## 2016-09-16 DIAGNOSIS — R260 Ataxic gait: Secondary | ICD-10-CM | POA: Diagnosis not present

## 2016-09-16 DIAGNOSIS — I4891 Unspecified atrial fibrillation: Secondary | ICD-10-CM | POA: Diagnosis not present

## 2016-09-16 DIAGNOSIS — R531 Weakness: Secondary | ICD-10-CM | POA: Diagnosis not present

## 2016-09-16 DIAGNOSIS — W010XXD Fall on same level from slipping, tripping and stumbling without subsequent striking against object, subsequent encounter: Secondary | ICD-10-CM | POA: Diagnosis not present

## 2016-09-17 DIAGNOSIS — M069 Rheumatoid arthritis, unspecified: Secondary | ICD-10-CM | POA: Diagnosis not present

## 2016-09-18 DIAGNOSIS — F332 Major depressive disorder, recurrent severe without psychotic features: Secondary | ICD-10-CM | POA: Diagnosis not present

## 2016-09-19 DIAGNOSIS — R278 Other lack of coordination: Secondary | ICD-10-CM | POA: Diagnosis not present

## 2016-09-19 DIAGNOSIS — R260 Ataxic gait: Secondary | ICD-10-CM | POA: Diagnosis not present

## 2016-09-19 DIAGNOSIS — I4891 Unspecified atrial fibrillation: Secondary | ICD-10-CM | POA: Diagnosis not present

## 2016-09-19 DIAGNOSIS — R2689 Other abnormalities of gait and mobility: Secondary | ICD-10-CM | POA: Diagnosis not present

## 2016-09-19 DIAGNOSIS — R531 Weakness: Secondary | ICD-10-CM | POA: Diagnosis not present

## 2016-09-19 DIAGNOSIS — W010XXD Fall on same level from slipping, tripping and stumbling without subsequent striking against object, subsequent encounter: Secondary | ICD-10-CM | POA: Diagnosis not present

## 2016-09-20 DIAGNOSIS — I4891 Unspecified atrial fibrillation: Secondary | ICD-10-CM | POA: Diagnosis not present

## 2016-09-20 DIAGNOSIS — R2689 Other abnormalities of gait and mobility: Secondary | ICD-10-CM | POA: Diagnosis not present

## 2016-09-20 DIAGNOSIS — R531 Weakness: Secondary | ICD-10-CM | POA: Diagnosis not present

## 2016-09-20 DIAGNOSIS — R278 Other lack of coordination: Secondary | ICD-10-CM | POA: Diagnosis not present

## 2016-09-20 DIAGNOSIS — R260 Ataxic gait: Secondary | ICD-10-CM | POA: Diagnosis not present

## 2016-09-20 DIAGNOSIS — W010XXD Fall on same level from slipping, tripping and stumbling without subsequent striking against object, subsequent encounter: Secondary | ICD-10-CM | POA: Diagnosis not present

## 2016-09-21 DIAGNOSIS — R531 Weakness: Secondary | ICD-10-CM | POA: Diagnosis not present

## 2016-09-21 DIAGNOSIS — W010XXD Fall on same level from slipping, tripping and stumbling without subsequent striking against object, subsequent encounter: Secondary | ICD-10-CM | POA: Diagnosis not present

## 2016-09-21 DIAGNOSIS — R260 Ataxic gait: Secondary | ICD-10-CM | POA: Diagnosis not present

## 2016-09-21 DIAGNOSIS — R278 Other lack of coordination: Secondary | ICD-10-CM | POA: Diagnosis not present

## 2016-09-21 DIAGNOSIS — R2689 Other abnormalities of gait and mobility: Secondary | ICD-10-CM | POA: Diagnosis not present

## 2016-09-21 DIAGNOSIS — I4891 Unspecified atrial fibrillation: Secondary | ICD-10-CM | POA: Diagnosis not present

## 2016-09-22 DIAGNOSIS — W010XXD Fall on same level from slipping, tripping and stumbling without subsequent striking against object, subsequent encounter: Secondary | ICD-10-CM | POA: Diagnosis not present

## 2016-09-22 DIAGNOSIS — I4891 Unspecified atrial fibrillation: Secondary | ICD-10-CM | POA: Diagnosis not present

## 2016-09-22 DIAGNOSIS — R260 Ataxic gait: Secondary | ICD-10-CM | POA: Diagnosis not present

## 2016-09-22 DIAGNOSIS — R2689 Other abnormalities of gait and mobility: Secondary | ICD-10-CM | POA: Diagnosis not present

## 2016-09-22 DIAGNOSIS — R278 Other lack of coordination: Secondary | ICD-10-CM | POA: Diagnosis not present

## 2016-09-22 DIAGNOSIS — R531 Weakness: Secondary | ICD-10-CM | POA: Diagnosis not present

## 2016-09-23 DIAGNOSIS — R278 Other lack of coordination: Secondary | ICD-10-CM | POA: Diagnosis not present

## 2016-09-23 DIAGNOSIS — R2689 Other abnormalities of gait and mobility: Secondary | ICD-10-CM | POA: Diagnosis not present

## 2016-09-23 DIAGNOSIS — R260 Ataxic gait: Secondary | ICD-10-CM | POA: Diagnosis not present

## 2016-09-23 DIAGNOSIS — R531 Weakness: Secondary | ICD-10-CM | POA: Diagnosis not present

## 2016-09-23 DIAGNOSIS — W010XXD Fall on same level from slipping, tripping and stumbling without subsequent striking against object, subsequent encounter: Secondary | ICD-10-CM | POA: Diagnosis not present

## 2016-09-23 DIAGNOSIS — I4891 Unspecified atrial fibrillation: Secondary | ICD-10-CM | POA: Diagnosis not present

## 2016-09-25 DIAGNOSIS — F332 Major depressive disorder, recurrent severe without psychotic features: Secondary | ICD-10-CM | POA: Diagnosis not present

## 2016-09-26 DIAGNOSIS — R260 Ataxic gait: Secondary | ICD-10-CM | POA: Diagnosis not present

## 2016-09-26 DIAGNOSIS — R278 Other lack of coordination: Secondary | ICD-10-CM | POA: Diagnosis not present

## 2016-09-26 DIAGNOSIS — R2689 Other abnormalities of gait and mobility: Secondary | ICD-10-CM | POA: Diagnosis not present

## 2016-09-26 DIAGNOSIS — R531 Weakness: Secondary | ICD-10-CM | POA: Diagnosis not present

## 2016-09-26 DIAGNOSIS — I4891 Unspecified atrial fibrillation: Secondary | ICD-10-CM | POA: Diagnosis not present

## 2016-09-26 DIAGNOSIS — W010XXD Fall on same level from slipping, tripping and stumbling without subsequent striking against object, subsequent encounter: Secondary | ICD-10-CM | POA: Diagnosis not present

## 2016-09-27 DIAGNOSIS — R2689 Other abnormalities of gait and mobility: Secondary | ICD-10-CM | POA: Diagnosis not present

## 2016-09-27 DIAGNOSIS — R260 Ataxic gait: Secondary | ICD-10-CM | POA: Diagnosis not present

## 2016-09-27 DIAGNOSIS — W010XXD Fall on same level from slipping, tripping and stumbling without subsequent striking against object, subsequent encounter: Secondary | ICD-10-CM | POA: Diagnosis not present

## 2016-09-27 DIAGNOSIS — I4891 Unspecified atrial fibrillation: Secondary | ICD-10-CM | POA: Diagnosis not present

## 2016-09-27 DIAGNOSIS — R531 Weakness: Secondary | ICD-10-CM | POA: Diagnosis not present

## 2016-09-27 DIAGNOSIS — R278 Other lack of coordination: Secondary | ICD-10-CM | POA: Diagnosis not present

## 2016-09-28 DIAGNOSIS — R531 Weakness: Secondary | ICD-10-CM | POA: Diagnosis not present

## 2016-09-28 DIAGNOSIS — R2689 Other abnormalities of gait and mobility: Secondary | ICD-10-CM | POA: Diagnosis not present

## 2016-09-28 DIAGNOSIS — W010XXD Fall on same level from slipping, tripping and stumbling without subsequent striking against object, subsequent encounter: Secondary | ICD-10-CM | POA: Diagnosis not present

## 2016-09-28 DIAGNOSIS — R278 Other lack of coordination: Secondary | ICD-10-CM | POA: Diagnosis not present

## 2016-09-28 DIAGNOSIS — I4891 Unspecified atrial fibrillation: Secondary | ICD-10-CM | POA: Diagnosis not present

## 2016-09-28 DIAGNOSIS — R260 Ataxic gait: Secondary | ICD-10-CM | POA: Diagnosis not present

## 2016-10-03 DIAGNOSIS — R531 Weakness: Secondary | ICD-10-CM | POA: Diagnosis not present

## 2016-10-03 DIAGNOSIS — I4891 Unspecified atrial fibrillation: Secondary | ICD-10-CM | POA: Diagnosis not present

## 2016-10-03 DIAGNOSIS — R260 Ataxic gait: Secondary | ICD-10-CM | POA: Diagnosis not present

## 2016-10-03 DIAGNOSIS — R278 Other lack of coordination: Secondary | ICD-10-CM | POA: Diagnosis not present

## 2016-10-03 DIAGNOSIS — F332 Major depressive disorder, recurrent severe without psychotic features: Secondary | ICD-10-CM | POA: Diagnosis not present

## 2016-10-03 DIAGNOSIS — R2681 Unsteadiness on feet: Secondary | ICD-10-CM | POA: Diagnosis not present

## 2016-10-03 DIAGNOSIS — W010XXD Fall on same level from slipping, tripping and stumbling without subsequent striking against object, subsequent encounter: Secondary | ICD-10-CM | POA: Diagnosis not present

## 2016-10-03 DIAGNOSIS — R2689 Other abnormalities of gait and mobility: Secondary | ICD-10-CM | POA: Diagnosis not present

## 2016-10-04 DIAGNOSIS — R2689 Other abnormalities of gait and mobility: Secondary | ICD-10-CM | POA: Diagnosis not present

## 2016-10-04 DIAGNOSIS — I4891 Unspecified atrial fibrillation: Secondary | ICD-10-CM | POA: Diagnosis not present

## 2016-10-04 DIAGNOSIS — M069 Rheumatoid arthritis, unspecified: Secondary | ICD-10-CM | POA: Diagnosis not present

## 2016-10-04 DIAGNOSIS — M159 Polyosteoarthritis, unspecified: Secondary | ICD-10-CM | POA: Diagnosis not present

## 2016-10-05 DIAGNOSIS — W010XXD Fall on same level from slipping, tripping and stumbling without subsequent striking against object, subsequent encounter: Secondary | ICD-10-CM | POA: Diagnosis not present

## 2016-10-05 DIAGNOSIS — R531 Weakness: Secondary | ICD-10-CM | POA: Diagnosis not present

## 2016-10-05 DIAGNOSIS — R260 Ataxic gait: Secondary | ICD-10-CM | POA: Diagnosis not present

## 2016-10-05 DIAGNOSIS — I4891 Unspecified atrial fibrillation: Secondary | ICD-10-CM | POA: Diagnosis not present

## 2016-10-05 DIAGNOSIS — R278 Other lack of coordination: Secondary | ICD-10-CM | POA: Diagnosis not present

## 2016-10-05 DIAGNOSIS — R2689 Other abnormalities of gait and mobility: Secondary | ICD-10-CM | POA: Diagnosis not present

## 2016-10-07 DIAGNOSIS — R531 Weakness: Secondary | ICD-10-CM | POA: Diagnosis not present

## 2016-10-07 DIAGNOSIS — I4891 Unspecified atrial fibrillation: Secondary | ICD-10-CM | POA: Diagnosis not present

## 2016-10-07 DIAGNOSIS — R278 Other lack of coordination: Secondary | ICD-10-CM | POA: Diagnosis not present

## 2016-10-07 DIAGNOSIS — R2689 Other abnormalities of gait and mobility: Secondary | ICD-10-CM | POA: Diagnosis not present

## 2016-10-07 DIAGNOSIS — R260 Ataxic gait: Secondary | ICD-10-CM | POA: Diagnosis not present

## 2016-10-07 DIAGNOSIS — W010XXD Fall on same level from slipping, tripping and stumbling without subsequent striking against object, subsequent encounter: Secondary | ICD-10-CM | POA: Diagnosis not present

## 2016-10-12 DIAGNOSIS — R278 Other lack of coordination: Secondary | ICD-10-CM | POA: Diagnosis not present

## 2016-10-12 DIAGNOSIS — R260 Ataxic gait: Secondary | ICD-10-CM | POA: Diagnosis not present

## 2016-10-12 DIAGNOSIS — R531 Weakness: Secondary | ICD-10-CM | POA: Diagnosis not present

## 2016-10-12 DIAGNOSIS — I4891 Unspecified atrial fibrillation: Secondary | ICD-10-CM | POA: Diagnosis not present

## 2016-10-12 DIAGNOSIS — W010XXD Fall on same level from slipping, tripping and stumbling without subsequent striking against object, subsequent encounter: Secondary | ICD-10-CM | POA: Diagnosis not present

## 2016-10-12 DIAGNOSIS — R2689 Other abnormalities of gait and mobility: Secondary | ICD-10-CM | POA: Diagnosis not present

## 2016-10-13 DIAGNOSIS — R531 Weakness: Secondary | ICD-10-CM | POA: Diagnosis not present

## 2016-10-13 DIAGNOSIS — R260 Ataxic gait: Secondary | ICD-10-CM | POA: Diagnosis not present

## 2016-10-13 DIAGNOSIS — W010XXD Fall on same level from slipping, tripping and stumbling without subsequent striking against object, subsequent encounter: Secondary | ICD-10-CM | POA: Diagnosis not present

## 2016-10-13 DIAGNOSIS — R2689 Other abnormalities of gait and mobility: Secondary | ICD-10-CM | POA: Diagnosis not present

## 2016-10-13 DIAGNOSIS — I4891 Unspecified atrial fibrillation: Secondary | ICD-10-CM | POA: Diagnosis not present

## 2016-10-13 DIAGNOSIS — R278 Other lack of coordination: Secondary | ICD-10-CM | POA: Diagnosis not present

## 2016-10-14 DIAGNOSIS — R260 Ataxic gait: Secondary | ICD-10-CM | POA: Diagnosis not present

## 2016-10-14 DIAGNOSIS — R278 Other lack of coordination: Secondary | ICD-10-CM | POA: Diagnosis not present

## 2016-10-14 DIAGNOSIS — W010XXD Fall on same level from slipping, tripping and stumbling without subsequent striking against object, subsequent encounter: Secondary | ICD-10-CM | POA: Diagnosis not present

## 2016-10-14 DIAGNOSIS — R531 Weakness: Secondary | ICD-10-CM | POA: Diagnosis not present

## 2016-10-14 DIAGNOSIS — I4891 Unspecified atrial fibrillation: Secondary | ICD-10-CM | POA: Diagnosis not present

## 2016-10-14 DIAGNOSIS — R2689 Other abnormalities of gait and mobility: Secondary | ICD-10-CM | POA: Diagnosis not present

## 2016-10-17 DIAGNOSIS — F4321 Adjustment disorder with depressed mood: Secondary | ICD-10-CM | POA: Diagnosis not present

## 2016-10-17 DIAGNOSIS — R2689 Other abnormalities of gait and mobility: Secondary | ICD-10-CM | POA: Diagnosis not present

## 2016-10-17 DIAGNOSIS — R260 Ataxic gait: Secondary | ICD-10-CM | POA: Diagnosis not present

## 2016-10-17 DIAGNOSIS — R531 Weakness: Secondary | ICD-10-CM | POA: Diagnosis not present

## 2016-10-17 DIAGNOSIS — W010XXD Fall on same level from slipping, tripping and stumbling without subsequent striking against object, subsequent encounter: Secondary | ICD-10-CM | POA: Diagnosis not present

## 2016-10-17 DIAGNOSIS — I4891 Unspecified atrial fibrillation: Secondary | ICD-10-CM | POA: Diagnosis not present

## 2016-10-17 DIAGNOSIS — F329 Major depressive disorder, single episode, unspecified: Secondary | ICD-10-CM | POA: Diagnosis not present

## 2016-10-17 DIAGNOSIS — R278 Other lack of coordination: Secondary | ICD-10-CM | POA: Diagnosis not present

## 2016-10-18 DIAGNOSIS — R260 Ataxic gait: Secondary | ICD-10-CM | POA: Diagnosis not present

## 2016-10-18 DIAGNOSIS — W010XXD Fall on same level from slipping, tripping and stumbling without subsequent striking against object, subsequent encounter: Secondary | ICD-10-CM | POA: Diagnosis not present

## 2016-10-18 DIAGNOSIS — R278 Other lack of coordination: Secondary | ICD-10-CM | POA: Diagnosis not present

## 2016-10-18 DIAGNOSIS — I4891 Unspecified atrial fibrillation: Secondary | ICD-10-CM | POA: Diagnosis not present

## 2016-10-18 DIAGNOSIS — R2689 Other abnormalities of gait and mobility: Secondary | ICD-10-CM | POA: Diagnosis not present

## 2016-10-18 DIAGNOSIS — R531 Weakness: Secondary | ICD-10-CM | POA: Diagnosis not present

## 2016-10-20 DIAGNOSIS — R260 Ataxic gait: Secondary | ICD-10-CM | POA: Diagnosis not present

## 2016-10-20 DIAGNOSIS — M25512 Pain in left shoulder: Secondary | ICD-10-CM | POA: Diagnosis not present

## 2016-10-20 DIAGNOSIS — R2689 Other abnormalities of gait and mobility: Secondary | ICD-10-CM | POA: Diagnosis not present

## 2016-10-20 DIAGNOSIS — W010XXD Fall on same level from slipping, tripping and stumbling without subsequent striking against object, subsequent encounter: Secondary | ICD-10-CM | POA: Diagnosis not present

## 2016-10-20 DIAGNOSIS — I4891 Unspecified atrial fibrillation: Secondary | ICD-10-CM | POA: Diagnosis not present

## 2016-10-20 DIAGNOSIS — R531 Weakness: Secondary | ICD-10-CM | POA: Diagnosis not present

## 2016-10-20 DIAGNOSIS — M159 Polyosteoarthritis, unspecified: Secondary | ICD-10-CM | POA: Diagnosis not present

## 2016-10-20 DIAGNOSIS — R278 Other lack of coordination: Secondary | ICD-10-CM | POA: Diagnosis not present

## 2016-10-24 ENCOUNTER — Encounter: Payer: Self-pay | Admitting: Cardiology

## 2016-10-24 ENCOUNTER — Ambulatory Visit (INDEPENDENT_AMBULATORY_CARE_PROVIDER_SITE_OTHER): Payer: Medicare Other | Admitting: Cardiology

## 2016-10-24 VITALS — BP 100/58 | HR 59 | Ht 59.0 in | Wt 104.2 lb

## 2016-10-24 DIAGNOSIS — E782 Mixed hyperlipidemia: Secondary | ICD-10-CM

## 2016-10-24 DIAGNOSIS — M069 Rheumatoid arthritis, unspecified: Secondary | ICD-10-CM | POA: Diagnosis not present

## 2016-10-24 DIAGNOSIS — I10 Essential (primary) hypertension: Secondary | ICD-10-CM

## 2016-10-24 DIAGNOSIS — I259 Chronic ischemic heart disease, unspecified: Secondary | ICD-10-CM | POA: Diagnosis not present

## 2016-10-24 DIAGNOSIS — R531 Weakness: Secondary | ICD-10-CM

## 2016-10-24 DIAGNOSIS — I2510 Atherosclerotic heart disease of native coronary artery without angina pectoris: Secondary | ICD-10-CM

## 2016-10-24 DIAGNOSIS — J449 Chronic obstructive pulmonary disease, unspecified: Secondary | ICD-10-CM | POA: Diagnosis not present

## 2016-10-24 NOTE — Progress Notes (Signed)
Clinical Summary Candice Meza is a 83 y.o.female seen today for follow up of the following medical problems.  1. CAD  - prior MI in 1997, non-obstructive disease by cath at that time.  - echo 2008 LVEF 60%, mild AI  - history of ASA allergy, has not been taking    - no recent chest pain. Occasioanl SOB that comes and goes. Increased walking with PT, energy level has been increasing.    2. HTN  - compliant with meds    3. Hyperlipidemia  -she is  compliant statin  4. LE edema - no recent issues   SH: eldest son Candice Meza lives in [place], [place]. John youngest son lives in [place].  Past Medical History:  Diagnosis Date  . Angina pectoris (HCC)   . Anxiety   . Ataxic gait   . Atherosclerotic heart disease   . Atrial fibrillation (HCC)   . Benign essential tremor   . COPD (chronic obstructive pulmonary disease) (HCC)   . Cough    Prolonged, possible asthmatic bronchitis  . Degenerative joint disease    versus rheumatoid arthritis  . Depression   . Disease of biliary tract   . Dysphagia   . GERD (gastroesophageal reflux disease)   . Hyperlipidemia   . Hypertension   . Hypokalemia   . Macular degeneration   . MI, acute, non ST segment elevation (HCC) 1997   Questionable lesion in the circumflex at catheterization; normal EF; chronic dyspnea without specific cause identified  . Osteopenia    Versus osteoporosis  . Postmenopausal    Estrogen replacement therapy  . Repeated falls   . Weakness      Allergies  Allergen Reactions  . Aspirin     GI symptoms  . Bee Venom   . Morphine And Related   . Sulfa Antibiotics     dyspnea  . Tetracyclines & Related   . Codeine Rash  . Pamelor Rash  . Penicillins Rash     Current Outpatient Prescriptions  Medication Sig Dispense Refill  . acetaminophen (TYLENOL) 500 MG tablet Take 500 mg by mouth 2 (two) times daily.     [name] albuterol (ACCUNEB) 1.25 MG/3ML nebulizer solution Take 1 ampule by  nebulization every 4 (four) hours as needed for wheezing.    [name] ALPRAZolam (XANAX) 0.25 MG tablet Take 0.25 mg by mouth 2 (two) times daily. For anxiety    . alum & mag hydroxide-simeth (MYLANTA) 200-200-20 MG/5ML suspension Take 15 mLs by mouth at bedtime.    . benzonatate (TESSALON) 200 MG capsule Take 200 mg by mouth 3 (three) times daily as needed for cough.    . calcium-vitamin D (OSCAL WITH D) 500-200 MG-UNIT tablet Take 1 tablet by mouth daily.    [name] dextromethorphan (DELSYM) 30 MG/5ML liquid Take 60 mg by mouth 2 (two) times daily as needed. For  cough    . fluticasone (FLONASE) 50 MCG/ACT nasal spray Place 2 sprays into the nose daily.      . furosemide (LASIX) 40 MG tablet Take 0.5 tablets (20 mg total) by mouth 2 (two) times daily. 30 tablet 0  . guaifenesin (HUMIBID E) 400 MG TABS tablet Take 600 mg by mouth 2 (two) times daily.     [name] guar gum powder Take 12 g by mouth 2 (two) times daily. In 8 oz of water    . INCRUSE ELLIPTA 62.5 MCG/INH AEPB Inhale 1 Inhaler into the lungs daily.    [name] KLOR-CON M20  20 MEQ tablet Take 1 tablet by mouth daily.    [name] loratadine (CLARITIN) 10 MG tablet Take 10 mg by mouth daily.    [name] losartan (COZAAR) 25 MG tablet Take 1 tablet by mouth daily.    . meloxicam (MOBIC) 15 MG tablet Take 15 mg by mouth daily.    . Multiple Vitamin (MULTIVITAMIN) tablet Take 1 tablet by mouth daily.      . nitroGLYCERIN (NITROSTAT) 0.4 MG SL tablet Place 0.4 mg under the tongue as directed.      Bertram Gala Glycol-Propyl Glycol (SYSTANE) 0.4-0.3 % SOLN Apply 1 drop to eye 3 (three) times daily.      . polyethylene glycol powder (GLYCOLAX/MIRALAX) powder Take 17 g by mouth daily.    . pravastatin (PRAVACHOL) 40 MG tablet Take 40 mg by mouth at bedtime.      . promethazine (PHENERGAN) 25 MG tablet Take 12.5 mg by mouth every 4 (four) hours as needed. For nausea    . SYMBICORT 160-4.5 MCG/ACT inhaler Inhale 2 Inhalers into the lungs 2 (two) times daily.    . traMADol (ULTRAM) 50  MG tablet Take 1 tablet (50 mg total) by mouth every 12 (twelve) hours as needed. For pain 30 tablet 0  . venlafaxine XR (EFFEXOR-XR) 75 MG 24 hr capsule Take 1 capsule by mouth daily.     No current facility-administered medications for this visit.      Past Surgical History:  Procedure Laterality Date  . BLADDER SUSPENSION    . ERCP  04/18/2012   Procedure: ENDOSCOPIC RETROGRADE CHOLANGIOPANCREATOGRAPHY (ERCP);  Surgeon: Malissa Hippo, MD;  Location: [facility];  Service: Endoscopy;  Laterality: N/A;  Diagnostic  . FOOT SURGERY     Bilateral  . INGUINAL HERNIA REPAIR     Left     Allergies  Allergen Reactions  . Aspirin     GI symptoms  . Bee Venom   . Morphine And Related   . Sulfa Antibiotics     dyspnea  . Tetracyclines & Related   . Codeine Rash  . Pamelor Rash  . Penicillins Rash      Family History  Problem Relation Age of Onset  . Coronary artery disease    . Stroke       Social History [name] reports that she has never smoked. She has never used smokeless tobacco. [name] reports that she does not drink alcohol.   Review of Systems CONSTITUTIONAL: No weight loss, fever, chills, weakness or fatigue.  HEENT: Eyes: No visual loss, blurred vision, double vision or yellow sclerae.No hearing loss, sneezing, congestion, runny nose or sore throat.  SKIN: No rash or itching.  CARDIOVASCULAR: per HPI RESPIRATORY: per hpi GASTROINTESTINAL: No anorexia, nausea, vomiting or diarrhea. No abdominal pain or blood.  GENITOURINARY: No burning on urination, no polyuria NEUROLOGICAL: No headache, dizziness, syncope, paralysis, ataxia, numbness or tingling in the extremities. No change in bowel or bladder control.  MUSCULOSKELETAL: No muscle, back pain, joint pain or stiffness.  LYMPHATICS: No enlarged nodes. No history of splenectomy.  PSYCHIATRIC: No history of depression or anxiety.  ENDOCRINOLOGIC: No reports of sweating, cold or heat intolerance. No  polyuria or polydipsia.  [name]   Physical Examination Vitals:   10/24/16 0817  BP: (!) 100/58  Pulse: (!) 59   Vitals:   10/24/16 0817  Weight: 104 lb 3.2 oz (47.3 kg)  Height: 4\' 11"  (1.499 m)    Gen: resting comfortably, no acute distress HEENT: no scleral  icterus, pupils equal round and reactive, no palptable cervical adenopathy,  CV: RRR, 2/6 systolic murmur at apex, no jvd Resp: Clear to auscultation bilaterally GI: abdomen is soft, non-tender, non-distended, normal bowel sounds, no hepatosplenomegaly MSK: extremities are warm, no edema.  Skin: warm, no rash Neuro:  no focal deficits Psych: appropriate affect   Diagnostic Studies 10/29/13 Echo Study Conclusions  - Left ventricle: The cavity size was normal. Wall thickness was increased in a pattern of mild LVH. Systolic function was normal. The estimated ejection fraction was in the range of 55% to 60%. Wall motion was normal; there were no regional wall motion abnormalities. There was an increased relative contribution of atrial contraction to ventricular filling. Doppler parameters are consistent with abnormal left ventricular relaxation (grade 1 diastolic dysfunction). - Aortic valve: Mildly calcified annulus. Trileaflet; mildly thickened, mildly calcified leaflets. Moderate, centralregurgitation. Minimal aortic stenosis. Peak velocity: 205cm/s (S). Mean gradient: 25mm Hg (S). Valve area: 1.85cm^2(VTI). - Mitral valve: Calcified annulus. Mildly thickened leaflets . Mild regurgitation. - Left atrium: The atrium was mildly dilated. - Tricuspid valve: Mildly thickened leaflets. Mild-moderate regurgitation. - Pulmonary arteries: PA peak pressure: 73mm Hg (S). Mild to moderately elevated pulmonary pressures.    Assessment and Plan   1. CAD  - no current symptoms, we will continue current meds  2. HTN  - bp is controlled, repeat labs on ARB and diuretic.    3. Hyperlipidemia  - repeat lipid panel,  continue statin  4. LE edema - doing well, we will repeat electrolytes and kidney function labs on lasix.    F/u 6 months   Antoine Poche, M.D.

## 2016-10-24 NOTE — Patient Instructions (Signed)
Your physician wants you to follow-up in: 6 MONTHS WITH DR [facility] You will receive a reminder letter in the mail two months in advance. If you don't receive a letter, please call our office to schedule the follow-up appointment.  Your physician recommends that you continue on your current medications as directed. Please refer to the Current Medication list given to you today.  Your physician recommends that you return for lab work CMP/CBC/TSH/MG  Thank you for choosing [facility]!!

## 2016-10-25 DIAGNOSIS — E782 Mixed hyperlipidemia: Secondary | ICD-10-CM | POA: Diagnosis not present

## 2016-10-25 DIAGNOSIS — Z79899 Other long term (current) drug therapy: Secondary | ICD-10-CM | POA: Diagnosis not present

## 2016-10-25 DIAGNOSIS — D518 Other vitamin B12 deficiency anemias: Secondary | ICD-10-CM | POA: Diagnosis not present

## 2016-10-25 DIAGNOSIS — E119 Type 2 diabetes mellitus without complications: Secondary | ICD-10-CM | POA: Diagnosis not present

## 2016-10-25 DIAGNOSIS — E038 Other specified hypothyroidism: Secondary | ICD-10-CM | POA: Diagnosis not present

## 2016-10-28 DIAGNOSIS — M79675 Pain in left toe(s): Secondary | ICD-10-CM | POA: Diagnosis not present

## 2016-10-28 DIAGNOSIS — L602 Onychogryphosis: Secondary | ICD-10-CM | POA: Diagnosis not present

## 2016-11-01 DIAGNOSIS — M159 Polyosteoarthritis, unspecified: Secondary | ICD-10-CM | POA: Diagnosis not present

## 2016-11-01 DIAGNOSIS — I4891 Unspecified atrial fibrillation: Secondary | ICD-10-CM | POA: Diagnosis not present

## 2016-11-01 DIAGNOSIS — M069 Rheumatoid arthritis, unspecified: Secondary | ICD-10-CM | POA: Diagnosis not present

## 2016-11-01 DIAGNOSIS — R2689 Other abnormalities of gait and mobility: Secondary | ICD-10-CM | POA: Diagnosis not present

## 2016-11-18 DIAGNOSIS — G3184 Mild cognitive impairment, so stated: Secondary | ICD-10-CM | POA: Diagnosis not present

## 2016-11-18 DIAGNOSIS — F411 Generalized anxiety disorder: Secondary | ICD-10-CM | POA: Diagnosis not present

## 2016-11-18 DIAGNOSIS — F330 Major depressive disorder, recurrent, mild: Secondary | ICD-10-CM | POA: Diagnosis not present

## 2016-11-29 DIAGNOSIS — I4891 Unspecified atrial fibrillation: Secondary | ICD-10-CM | POA: Diagnosis not present

## 2016-11-29 DIAGNOSIS — M159 Polyosteoarthritis, unspecified: Secondary | ICD-10-CM | POA: Diagnosis not present

## 2016-11-29 DIAGNOSIS — M069 Rheumatoid arthritis, unspecified: Secondary | ICD-10-CM | POA: Diagnosis not present

## 2016-11-29 DIAGNOSIS — R2689 Other abnormalities of gait and mobility: Secondary | ICD-10-CM | POA: Diagnosis not present

## 2016-12-27 DIAGNOSIS — K210 Gastro-esophageal reflux disease with esophagitis: Secondary | ICD-10-CM | POA: Diagnosis not present

## 2016-12-27 DIAGNOSIS — I4891 Unspecified atrial fibrillation: Secondary | ICD-10-CM | POA: Diagnosis not present

## 2016-12-27 DIAGNOSIS — M159 Polyosteoarthritis, unspecified: Secondary | ICD-10-CM | POA: Diagnosis not present

## 2016-12-27 DIAGNOSIS — M069 Rheumatoid arthritis, unspecified: Secondary | ICD-10-CM | POA: Diagnosis not present

## 2017-01-13 DIAGNOSIS — R300 Dysuria: Secondary | ICD-10-CM | POA: Diagnosis not present

## 2017-01-17 DIAGNOSIS — I739 Peripheral vascular disease, unspecified: Secondary | ICD-10-CM | POA: Diagnosis not present

## 2017-01-17 DIAGNOSIS — L84 Corns and callosities: Secondary | ICD-10-CM | POA: Diagnosis not present

## 2017-01-17 DIAGNOSIS — B351 Tinea unguium: Secondary | ICD-10-CM | POA: Diagnosis not present

## 2017-01-17 DIAGNOSIS — M79675 Pain in left toe(s): Secondary | ICD-10-CM | POA: Diagnosis not present

## 2017-01-24 DIAGNOSIS — M069 Rheumatoid arthritis, unspecified: Secondary | ICD-10-CM | POA: Diagnosis not present

## 2017-01-24 DIAGNOSIS — I4891 Unspecified atrial fibrillation: Secondary | ICD-10-CM | POA: Diagnosis not present

## 2017-01-24 DIAGNOSIS — M159 Polyosteoarthritis, unspecified: Secondary | ICD-10-CM | POA: Diagnosis not present

## 2017-01-24 DIAGNOSIS — R2689 Other abnormalities of gait and mobility: Secondary | ICD-10-CM | POA: Diagnosis not present

## 2017-02-23 DIAGNOSIS — M7052 Other bursitis of knee, left knee: Secondary | ICD-10-CM | POA: Diagnosis not present

## 2017-02-23 DIAGNOSIS — H353130 Nonexudative age-related macular degeneration, bilateral, stage unspecified: Secondary | ICD-10-CM | POA: Diagnosis not present

## 2017-02-23 IMAGING — DX DG CHEST 2V
2 series · 2 of 2 positions shown · non-contrast
Comparison: 07/02/2015

CLINICAL DATA: Cough and fever and shortness of breath

EXAM:
CHEST  2 VIEW

[chest lat]
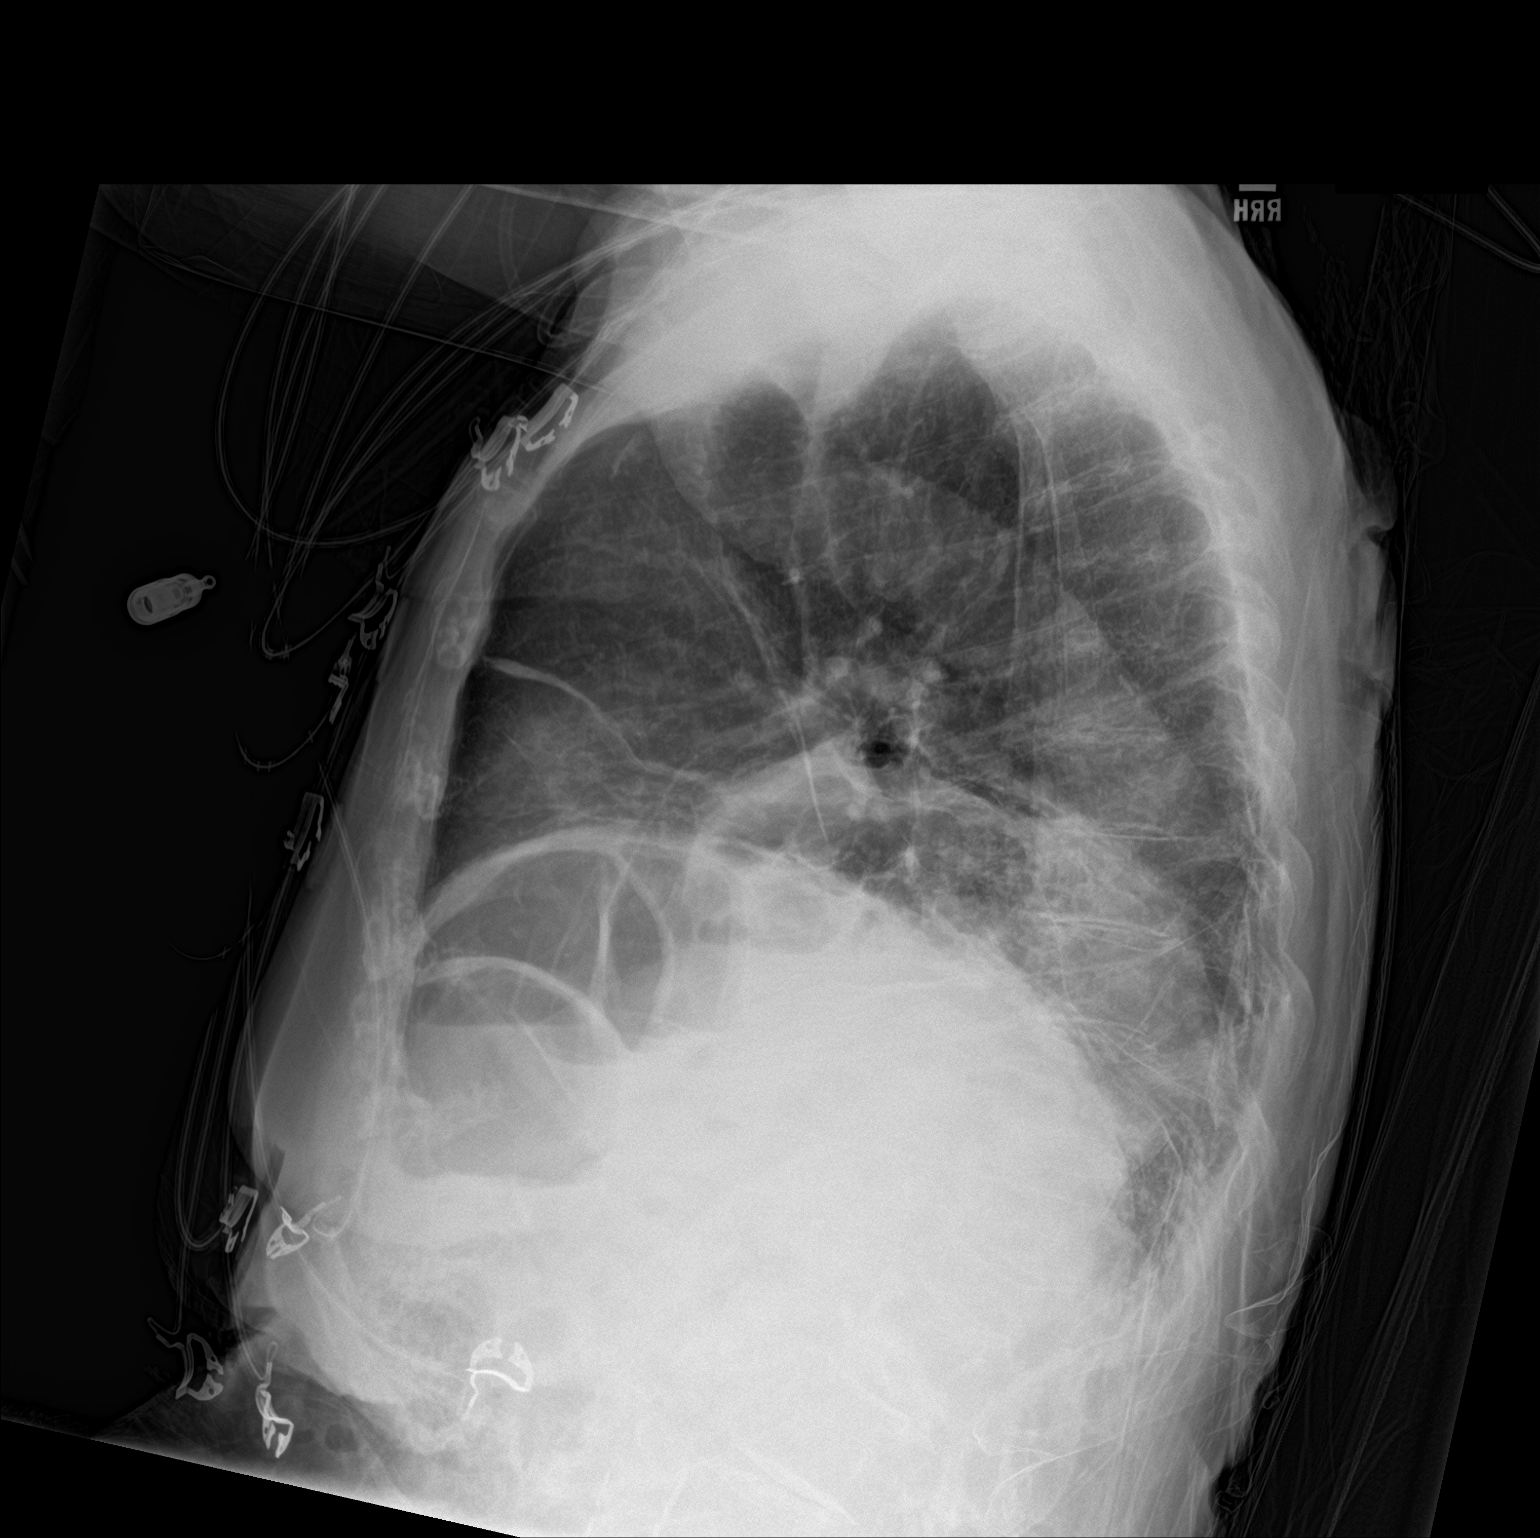

[chest ap]
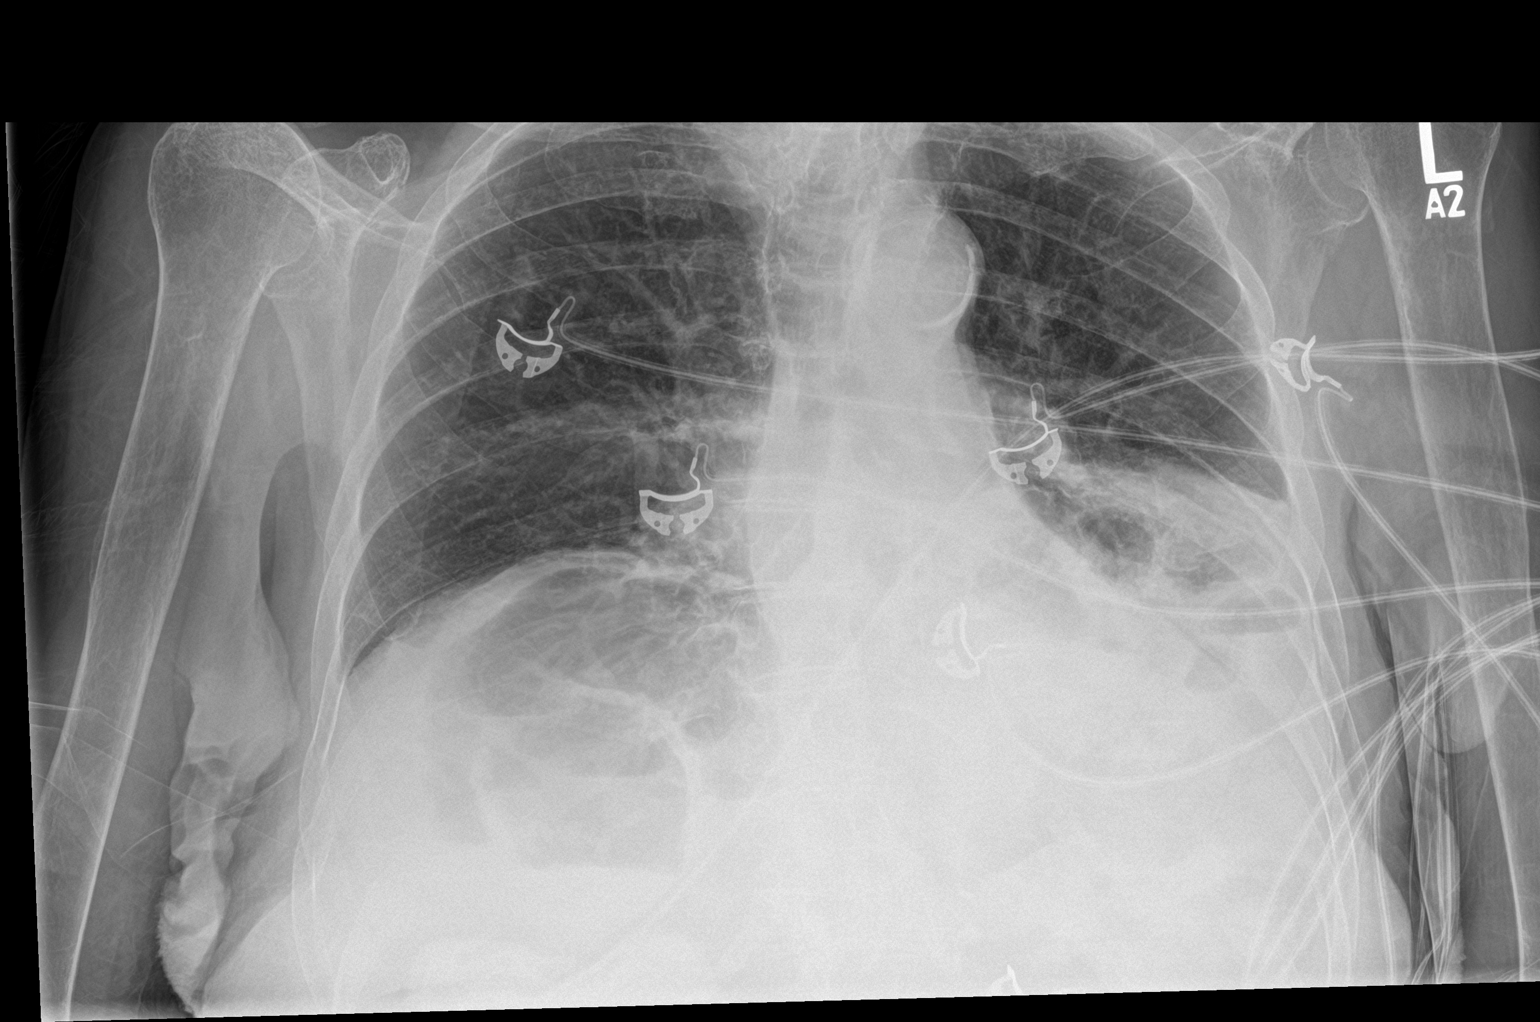

[2 of 2 positions shown; findings below may reference images not displayed]

FINDINGS: Aortic atherosclerosis identified. There are decreased lung volumes
with asymmetric elevation of the left hemidiaphragm. Atelectasis is
noted within both lung bases.
IMPRESSION: 1. Low lung volumes.
2. Bibasilar atelectasis.
3. Aortic atherosclerosis.

## 2017-02-24 DIAGNOSIS — F4321 Adjustment disorder with depressed mood: Secondary | ICD-10-CM | POA: Diagnosis not present

## 2017-02-24 DIAGNOSIS — F411 Generalized anxiety disorder: Secondary | ICD-10-CM | POA: Diagnosis not present

## 2017-02-24 DIAGNOSIS — F330 Major depressive disorder, recurrent, mild: Secondary | ICD-10-CM | POA: Diagnosis not present

## 2017-02-25 IMAGING — CR DG CHEST 1V PORT
1 series · 1 of 1 positions shown · non-contrast
Comparison: Prior chest x-ray 03/14/2016

CLINICAL DATA: [AGE] female with healthcare acquired
pneumonia

EXAM:
PORTABLE CHEST 1 VIEW

[ap portable]
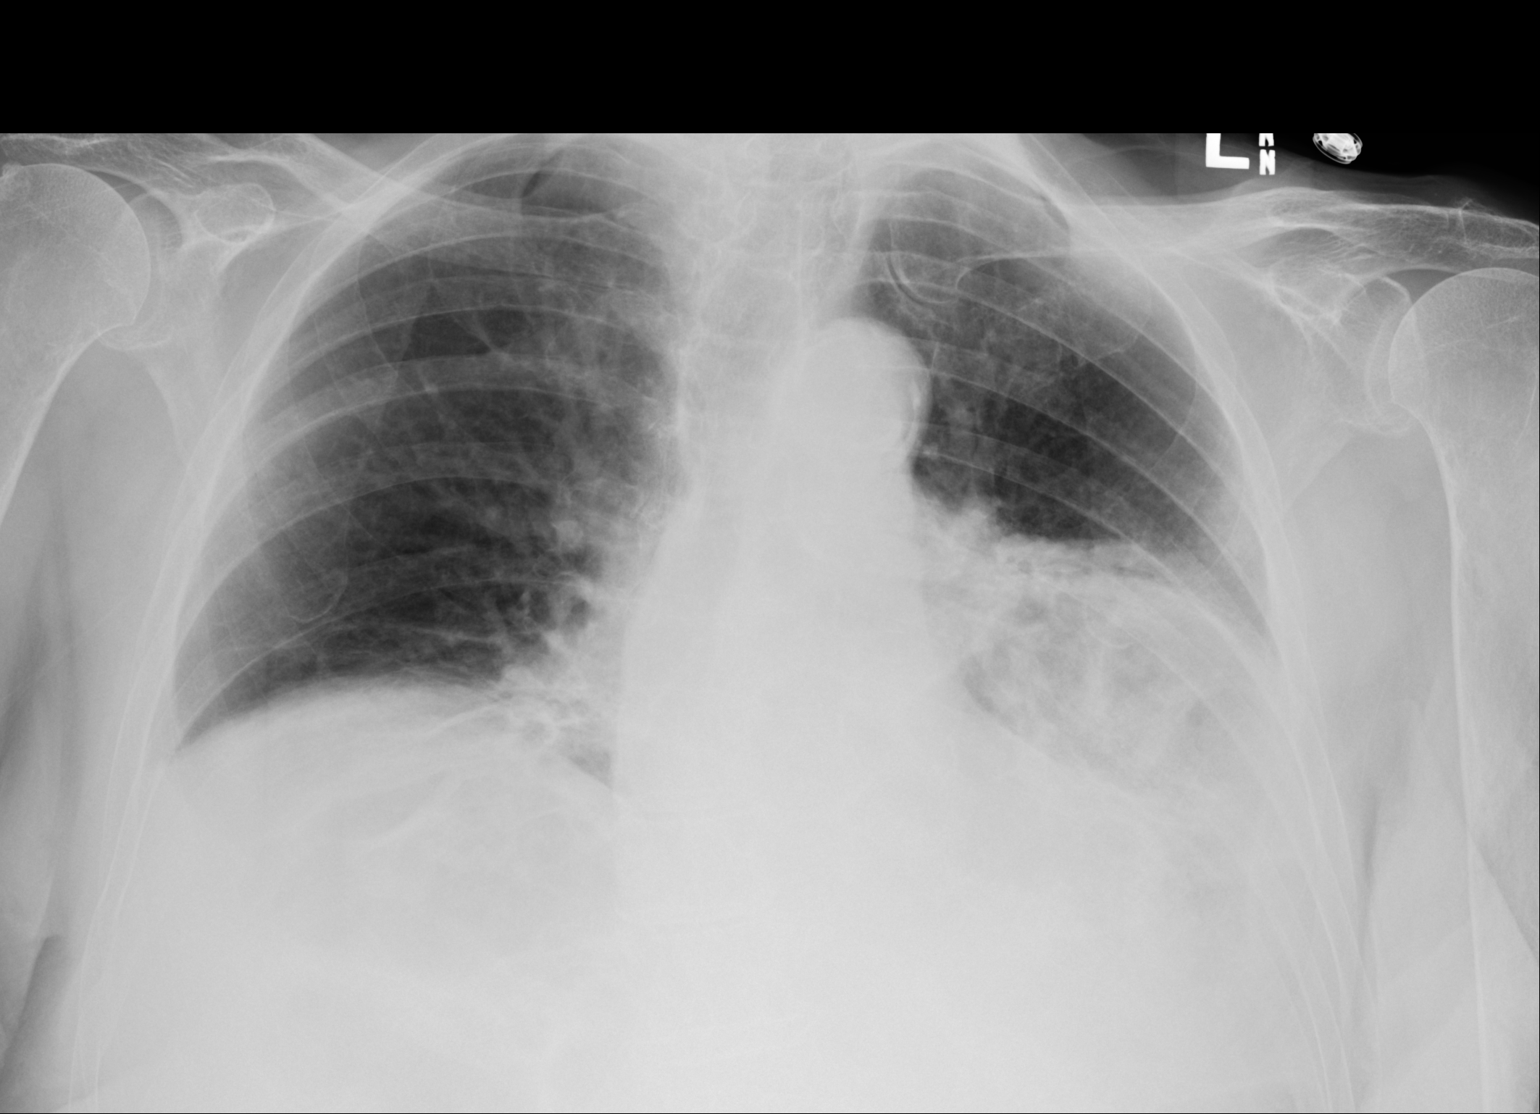

[1 of 1 positions shown; findings below may reference images not displayed]

FINDINGS: Persistent very low inspiratory volumes with chronic elevation of
the left hemidiaphragm. Linear atelectasis present in the lingula
and bilateral lower lobes. Large hiatal hernia. Cardiac and
mediastinal contours remain within normal limits. Atherosclerotic
calcifications present in the transverse aorta. No pulmonary edema,
pleural effusion or pneumothorax. No acute osseous abnormality.
IMPRESSION: 1. Stable chest x-ray without evidence of acute cardiopulmonary
process. Similar low inspiratory volumes, chronic elevation of the
left hemidiaphragm and bibasilar atelectasis.
2. Very large hiatal hernia again noted.
3.  Aortic Atherosclerosis ([id number]-170.0).

## 2017-03-09 DIAGNOSIS — D518 Other vitamin B12 deficiency anemias: Secondary | ICD-10-CM | POA: Diagnosis not present

## 2017-03-09 DIAGNOSIS — E559 Vitamin D deficiency, unspecified: Secondary | ICD-10-CM | POA: Diagnosis not present

## 2017-03-09 DIAGNOSIS — E782 Mixed hyperlipidemia: Secondary | ICD-10-CM | POA: Diagnosis not present

## 2017-03-09 DIAGNOSIS — Z79899 Other long term (current) drug therapy: Secondary | ICD-10-CM | POA: Diagnosis not present

## 2017-03-09 DIAGNOSIS — E038 Other specified hypothyroidism: Secondary | ICD-10-CM | POA: Diagnosis not present

## 2017-03-23 DIAGNOSIS — M069 Rheumatoid arthritis, unspecified: Secondary | ICD-10-CM | POA: Diagnosis not present

## 2017-03-23 DIAGNOSIS — I4891 Unspecified atrial fibrillation: Secondary | ICD-10-CM | POA: Diagnosis not present

## 2017-03-23 DIAGNOSIS — K210 Gastro-esophageal reflux disease with esophagitis: Secondary | ICD-10-CM | POA: Diagnosis not present

## 2017-03-23 DIAGNOSIS — I10 Essential (primary) hypertension: Secondary | ICD-10-CM | POA: Diagnosis not present

## 2017-04-11 DIAGNOSIS — Z23 Encounter for immunization: Secondary | ICD-10-CM | POA: Diagnosis not present

## 2017-04-19 DIAGNOSIS — I739 Peripheral vascular disease, unspecified: Secondary | ICD-10-CM | POA: Diagnosis not present

## 2017-04-19 DIAGNOSIS — K210 Gastro-esophageal reflux disease with esophagitis: Secondary | ICD-10-CM | POA: Diagnosis not present

## 2017-04-19 DIAGNOSIS — M069 Rheumatoid arthritis, unspecified: Secondary | ICD-10-CM | POA: Diagnosis not present

## 2017-04-19 DIAGNOSIS — I10 Essential (primary) hypertension: Secondary | ICD-10-CM | POA: Diagnosis not present

## 2017-04-24 DIAGNOSIS — E038 Other specified hypothyroidism: Secondary | ICD-10-CM | POA: Diagnosis not present

## 2017-04-24 DIAGNOSIS — E559 Vitamin D deficiency, unspecified: Secondary | ICD-10-CM | POA: Diagnosis not present

## 2017-04-24 DIAGNOSIS — D518 Other vitamin B12 deficiency anemias: Secondary | ICD-10-CM | POA: Diagnosis not present

## 2017-04-24 DIAGNOSIS — E119 Type 2 diabetes mellitus without complications: Secondary | ICD-10-CM | POA: Diagnosis not present

## 2017-04-24 DIAGNOSIS — Z79899 Other long term (current) drug therapy: Secondary | ICD-10-CM | POA: Diagnosis not present

## 2017-04-25 DIAGNOSIS — M79675 Pain in left toe(s): Secondary | ICD-10-CM | POA: Diagnosis not present

## 2017-04-25 DIAGNOSIS — B351 Tinea unguium: Secondary | ICD-10-CM | POA: Diagnosis not present

## 2017-04-25 DIAGNOSIS — L84 Corns and callosities: Secondary | ICD-10-CM | POA: Diagnosis not present

## 2017-04-25 DIAGNOSIS — I739 Peripheral vascular disease, unspecified: Secondary | ICD-10-CM | POA: Diagnosis not present

## 2017-04-26 ENCOUNTER — Ambulatory Visit (INDEPENDENT_AMBULATORY_CARE_PROVIDER_SITE_OTHER): Payer: Medicare Other | Admitting: Cardiology

## 2017-04-26 ENCOUNTER — Encounter: Payer: Self-pay | Admitting: Cardiology

## 2017-04-26 VITALS — BP 90/50 | HR 64 | Ht <= 58 in | Wt 105.6 lb

## 2017-04-26 DIAGNOSIS — E782 Mixed hyperlipidemia: Secondary | ICD-10-CM

## 2017-04-26 DIAGNOSIS — R600 Localized edema: Secondary | ICD-10-CM

## 2017-04-26 DIAGNOSIS — I10 Essential (primary) hypertension: Secondary | ICD-10-CM

## 2017-04-26 DIAGNOSIS — I2510 Atherosclerotic heart disease of native coronary artery without angina pectoris: Secondary | ICD-10-CM

## 2017-04-26 DIAGNOSIS — R278 Other lack of coordination: Secondary | ICD-10-CM | POA: Diagnosis not present

## 2017-04-26 DIAGNOSIS — R260 Ataxic gait: Secondary | ICD-10-CM | POA: Diagnosis not present

## 2017-04-26 DIAGNOSIS — W010XXD Fall on same level from slipping, tripping and stumbling without subsequent striking against object, subsequent encounter: Secondary | ICD-10-CM | POA: Diagnosis not present

## 2017-04-26 DIAGNOSIS — R531 Weakness: Secondary | ICD-10-CM | POA: Diagnosis not present

## 2017-04-26 DIAGNOSIS — I4891 Unspecified atrial fibrillation: Secondary | ICD-10-CM | POA: Diagnosis not present

## 2017-04-26 MED ORDER — LOSARTAN POTASSIUM 25 MG PO TABS: 12.5000 mg | ORAL_TABLET | Freq: Every day | ORAL | 1 refills | Status: DC

## 2017-04-26 MED ORDER — AZITHROMYCIN 250 MG PO TABS: ORAL_TABLET | ORAL | 0 refills | Status: DC

## 2017-04-26 NOTE — Progress Notes (Signed)
Clinical Summary Candice Meza is a 83 y.o.female  seen today for follow up of the following medical problems.  1. CAD  - prior MI in 1997, non-obstructive disease by cath at that time.  - echo 2008 LVEF 60%, mild AI  - history of ASA allergy, has not been taking   - no recent chest pain/SOB/DOE  2. HTN  -she remains compliant with meds   3. Hyperlipidemia  -remains compliant with statin  4. LE edema - she denies any recent edema  5. Cough - productive cough x 2 weeks   SH: eldest son Candice Meza lives in [place], [place]. Candice Meza youngest son lives in [place].    Past Medical History:  Diagnosis Date  . Angina pectoris (HCC)   . Anxiety   . Ataxic gait   . Atherosclerotic heart disease   . Atrial fibrillation (HCC)   . Benign essential tremor   . COPD (chronic obstructive pulmonary disease) (HCC)   . Cough    Prolonged, possible asthmatic bronchitis  . Degenerative joint disease    versus rheumatoid arthritis  . Depression   . Disease of biliary tract   . Dysphagia   . GERD (gastroesophageal reflux disease)   . Hyperlipidemia   . Hypertension   . Hypokalemia   . Macular degeneration   . MI, acute, non ST segment elevation (HCC) 1997   Questionable lesion in the circumflex at catheterization; normal EF; chronic dyspnea without specific cause identified  . Osteopenia    Versus osteoporosis  . Postmenopausal    Estrogen replacement therapy  . Repeated falls   . Weakness      Allergies  Allergen Reactions  . Aspirin     GI symptoms  . Bee Venom   . Morphine And Related   . Sulfa Antibiotics     dyspnea  . Tetracyclines & Related   . Codeine Rash  . Pamelor Rash  . Penicillins Rash     Current Outpatient Prescriptions  Medication Sig Dispense Refill  . acetaminophen (TYLENOL) 500 MG tablet Take 500 mg by mouth 2 (two) times daily.     [name] ALPRAZolam (XANAX) 0.25 MG tablet Take 0.25 mg by mouth 2 (two) times daily. For anxiety      . alum & mag hydroxide-simeth (MYLANTA) 200-200-20 MG/5ML suspension Take 15 mLs by mouth at bedtime.    . Calcium Carb-Cholecalciferol (CALCIUM-VITAMIN D) 500-200 MG-UNIT tablet Take 1 tablet by mouth daily.    [name] dextromethorphan (DELSYM) 30 MG/5ML liquid Take 60 mg by mouth 2 (two) times daily as needed. For  cough    . fluticasone (FLONASE) 50 MCG/ACT nasal spray Place 2 sprays into the nose daily.      . furosemide (LASIX) 20 MG tablet Take 20 mg by mouth 2 (two) times daily.    [name] guaifenesin (HUMIBID E) 400 MG TABS tablet Take 600 mg by mouth 2 (two) times daily.     . INCRUSE ELLIPTA 62.5 MCG/INH AEPB Inhale 1 Inhaler into the lungs daily.    [name] levothyroxine (SYNTHROID, LEVOTHROID) 75 MCG tablet Take 75 mcg by mouth daily before breakfast.    . loratadine (CLARITIN) 10 MG tablet Take 10 mg by mouth daily.    [name] losartan (COZAAR) 25 MG tablet Take 1 tablet by mouth daily.    . meloxicam (MOBIC) 7.5 MG tablet Take 7.5 mg by mouth every morning.    . Multiple Vitamin (MULTIVITAMIN) tablet Take 1 tablet by mouth daily.      [name]  nitroGLYCERIN (NITROSTAT) 0.4 MG SL tablet Place 0.4 mg under the tongue as directed.      Candice Meza Glycol-Propyl Glycol (SYSTANE) 0.4-0.3 % SOLN Apply 1 drop to eye 3 (three) times daily.      . polyethylene glycol powder (GLYCOLAX/MIRALAX) powder Take 17 g by mouth daily.    . potassium chloride (KLOR-CON) 20 MEQ packet Take 20 mEq by mouth every morning.    . pravastatin (PRAVACHOL) 40 MG tablet Take 40 mg by mouth at bedtime.      . Psyllium Husk POWD Give 3.4 gram by mouth twice a day for bowel management - 1 teaspoon    . ranitidine (ZANTAC) 75 MG tablet Take 75 mg by mouth 2 (two) times daily.    . SYMBICORT 160-4.5 MCG/ACT inhaler Inhale 2 Inhalers into the lungs 2 (two) times daily.    . traMADol (ULTRAM) 50 MG tablet Take 1 tablet (50 mg total) by mouth every 12 (twelve) hours as needed. For pain 30 tablet 0  . venlafaxine (EFFEXOR) 37.5 MG tablet Take 37.5  mg by mouth every morning. Takes with the 75mg .    . venlafaxine XR (EFFEXOR-XR) 75 MG 24 hr capsule Take 1 capsule by mouth every morning.      No current facility-administered medications for this visit.      Past Surgical History:  Procedure Laterality Date  . BLADDER SUSPENSION    . ERCP  04/18/2012   Procedure: ENDOSCOPIC RETROGRADE CHOLANGIOPANCREATOGRAPHY (ERCP);  Surgeon: 04/20/2012, MD;  Location: [facility];  Service: Endoscopy;  Laterality: N/A;  Diagnostic  . FOOT SURGERY     Bilateral  . INGUINAL HERNIA REPAIR     Left     Allergies  Allergen Reactions  . Aspirin     GI symptoms  . Bee Venom   . Morphine And Related   . Sulfa Antibiotics     dyspnea  . Tetracyclines & Related   . Codeine Rash  . Pamelor Rash  . Penicillins Rash      Family History  Problem Relation Age of Onset  . Coronary artery disease Unknown   . Stroke Unknown      Social History [name] reports that she has never smoked. She has never used smokeless tobacco. [name] reports that she does not drink alcohol.   Review of Systems CONSTITUTIONAL: No weight loss, fever, chills, weakness or fatigue.  HEENT: Eyes: No visual loss, blurred vision, double vision or yellow sclerae.No hearing loss, sneezing, congestion, runny nose or sore throat.  SKIN: No rash or itching.  CARDIOVASCULAR: per hpi RESPIRATORY: +cough GASTROINTESTINAL: No anorexia, nausea, vomiting or diarrhea. No abdominal pain or blood.  GENITOURINARY: No burning on urination, no polyuria NEUROLOGICAL: No headache, dizziness, syncope, paralysis, ataxia, numbness or tingling in the extremities. No change in bowel or bladder control.  MUSCULOSKELETAL: No muscle, back pain, joint pain or stiffness.  LYMPHATICS: No enlarged nodes. No history of splenectomy.  PSYCHIATRIC: No history of depression or anxiety.  ENDOCRINOLOGIC: No reports of sweating, cold or heat intolerance. No polyuria or polydipsia.   Buford Dresser   Physical Examination Vitals:   04/26/17 1359  BP: (!) 90/50  Pulse: 64  SpO2: 94%   Filed Weights   04/26/17 1359  Weight: 105 lb 9.6 oz (47.9 kg)    Gen: resting comfortably, no acute distress HEENT: no scleral icterus, pupils equal round and reactive, no palptable cervical adenopathy,  CV: RRR, no m/rg, no jvd Resp: Clear to auscultation bilaterally  GI: abdomen is soft, non-tender, non-distended, normal bowel sounds, no hepatosplenomegaly MSK: extremities are warm, no edema.  Skin: warm, no rash Neuro:  no focal deficits Psych: appropriate affect   Diagnostic Studies 10/29/13 Echo Study Conclusions  - Left ventricle: The cavity size was normal. Wall thickness was increased in a pattern of mild LVH. Systolic function was normal. The estimated ejection fraction was in the range of 55% to 60%. Wall motion was normal; there were no regional wall motion abnormalities. There was an increased relative contribution of atrial contraction to ventricular filling. Doppler parameters are consistent with abnormal left ventricular relaxation (grade 1 diastolic dysfunction). - Aortic valve: Mildly calcified annulus. Trileaflet; mildly thickened, mildly calcified leaflets. Moderate, centralregurgitation. Minimal aortic stenosis. Peak velocity: 205cm/s (S). Mean gradient: 80mm Hg (S). Valve area: 1.85cm^2(VTI). - Mitral valve: Calcified annulus. Mildly thickened leaflets . Mild regurgitation. - Left atrium: The atrium was mildly dilated. - Tricuspid valve: Mildly thickened leaflets. Mild-moderate regurgitation. - Pulmonary arteries: PA peak pressure: 59mm Hg (S). Mild to moderately elevated pulmonary pressures.     Assessment and Plan  1. CAD  - no recent symptoms, she will continue current meds  2. HTN  - mildly low bp's over the last few visits, we will lower losartan to 12.5mg  daily  3. Hyperlipidemia  - continue current statin  4. LE edema - no recent  edema, continue to monitor.  5. Cough - plan for 5 days of azithromycin  F/u 6 months       Antoine Poche, M.D.

## 2017-04-26 NOTE — Patient Instructions (Signed)
Your physician wants you to follow-up in: 6 MONTHS WITH DR [facility] You will receive a reminder letter in the mail two months in advance. If you don't receive a letter, please call our office to schedule the follow-up appointment.  Your physician has recommended you make the following change in your medication:   START AZITHROMYCIN 500 MG (2 TABLETS) DAY 1 THEN 250 MG (1 TABLET) DAILY FOR 4 DAYS   DECREASE LOSARTAN 12.5 MG (1/2 TABLET) DAILY   Thank you for choosing [facility]!!

## 2017-04-27 DIAGNOSIS — W010XXD Fall on same level from slipping, tripping and stumbling without subsequent striking against object, subsequent encounter: Secondary | ICD-10-CM | POA: Diagnosis not present

## 2017-04-27 DIAGNOSIS — R278 Other lack of coordination: Secondary | ICD-10-CM | POA: Diagnosis not present

## 2017-04-27 DIAGNOSIS — R260 Ataxic gait: Secondary | ICD-10-CM | POA: Diagnosis not present

## 2017-04-27 DIAGNOSIS — I4891 Unspecified atrial fibrillation: Secondary | ICD-10-CM | POA: Diagnosis not present

## 2017-04-27 DIAGNOSIS — R531 Weakness: Secondary | ICD-10-CM | POA: Diagnosis not present

## 2017-04-28 DIAGNOSIS — R278 Other lack of coordination: Secondary | ICD-10-CM | POA: Diagnosis not present

## 2017-04-28 DIAGNOSIS — W010XXD Fall on same level from slipping, tripping and stumbling without subsequent striking against object, subsequent encounter: Secondary | ICD-10-CM | POA: Diagnosis not present

## 2017-04-28 DIAGNOSIS — I4891 Unspecified atrial fibrillation: Secondary | ICD-10-CM | POA: Diagnosis not present

## 2017-04-28 DIAGNOSIS — R260 Ataxic gait: Secondary | ICD-10-CM | POA: Diagnosis not present

## 2017-04-28 DIAGNOSIS — R531 Weakness: Secondary | ICD-10-CM | POA: Diagnosis not present

## 2017-05-01 DIAGNOSIS — I4891 Unspecified atrial fibrillation: Secondary | ICD-10-CM | POA: Diagnosis not present

## 2017-05-01 DIAGNOSIS — R278 Other lack of coordination: Secondary | ICD-10-CM | POA: Diagnosis not present

## 2017-05-01 DIAGNOSIS — W010XXD Fall on same level from slipping, tripping and stumbling without subsequent striking against object, subsequent encounter: Secondary | ICD-10-CM | POA: Diagnosis not present

## 2017-05-01 DIAGNOSIS — R260 Ataxic gait: Secondary | ICD-10-CM | POA: Diagnosis not present

## 2017-05-01 DIAGNOSIS — R531 Weakness: Secondary | ICD-10-CM | POA: Diagnosis not present

## 2017-05-02 DIAGNOSIS — B370 Candidal stomatitis: Secondary | ICD-10-CM | POA: Diagnosis not present

## 2017-05-02 DIAGNOSIS — I4891 Unspecified atrial fibrillation: Secondary | ICD-10-CM | POA: Diagnosis not present

## 2017-05-02 DIAGNOSIS — R531 Weakness: Secondary | ICD-10-CM | POA: Diagnosis not present

## 2017-05-02 DIAGNOSIS — W010XXD Fall on same level from slipping, tripping and stumbling without subsequent striking against object, subsequent encounter: Secondary | ICD-10-CM | POA: Diagnosis not present

## 2017-05-02 DIAGNOSIS — R260 Ataxic gait: Secondary | ICD-10-CM | POA: Diagnosis not present

## 2017-05-02 DIAGNOSIS — R278 Other lack of coordination: Secondary | ICD-10-CM | POA: Diagnosis not present

## 2017-05-03 DIAGNOSIS — I4891 Unspecified atrial fibrillation: Secondary | ICD-10-CM | POA: Diagnosis not present

## 2017-05-03 DIAGNOSIS — W010XXD Fall on same level from slipping, tripping and stumbling without subsequent striking against object, subsequent encounter: Secondary | ICD-10-CM | POA: Diagnosis not present

## 2017-05-03 DIAGNOSIS — R260 Ataxic gait: Secondary | ICD-10-CM | POA: Diagnosis not present

## 2017-05-03 DIAGNOSIS — R531 Weakness: Secondary | ICD-10-CM | POA: Diagnosis not present

## 2017-05-03 DIAGNOSIS — R278 Other lack of coordination: Secondary | ICD-10-CM | POA: Diagnosis not present

## 2017-05-04 DIAGNOSIS — R531 Weakness: Secondary | ICD-10-CM | POA: Diagnosis not present

## 2017-05-04 DIAGNOSIS — I4891 Unspecified atrial fibrillation: Secondary | ICD-10-CM | POA: Diagnosis not present

## 2017-05-04 DIAGNOSIS — R260 Ataxic gait: Secondary | ICD-10-CM | POA: Diagnosis not present

## 2017-05-04 DIAGNOSIS — W010XXD Fall on same level from slipping, tripping and stumbling without subsequent striking against object, subsequent encounter: Secondary | ICD-10-CM | POA: Diagnosis not present

## 2017-05-04 DIAGNOSIS — M25542 Pain in joints of left hand: Secondary | ICD-10-CM | POA: Diagnosis not present

## 2017-05-04 DIAGNOSIS — M25541 Pain in joints of right hand: Secondary | ICD-10-CM | POA: Diagnosis not present

## 2017-05-04 DIAGNOSIS — R278 Other lack of coordination: Secondary | ICD-10-CM | POA: Diagnosis not present

## 2017-05-05 DIAGNOSIS — R278 Other lack of coordination: Secondary | ICD-10-CM | POA: Diagnosis not present

## 2017-05-05 DIAGNOSIS — I4891 Unspecified atrial fibrillation: Secondary | ICD-10-CM | POA: Diagnosis not present

## 2017-05-05 DIAGNOSIS — M25541 Pain in joints of right hand: Secondary | ICD-10-CM | POA: Diagnosis not present

## 2017-05-05 DIAGNOSIS — R260 Ataxic gait: Secondary | ICD-10-CM | POA: Diagnosis not present

## 2017-05-05 DIAGNOSIS — M25542 Pain in joints of left hand: Secondary | ICD-10-CM | POA: Diagnosis not present

## 2017-05-05 DIAGNOSIS — W010XXD Fall on same level from slipping, tripping and stumbling without subsequent striking against object, subsequent encounter: Secondary | ICD-10-CM | POA: Diagnosis not present

## 2017-05-06 DIAGNOSIS — L97909 Non-pressure chronic ulcer of unspecified part of unspecified lower leg with unspecified severity: Secondary | ICD-10-CM | POA: Diagnosis not present

## 2017-05-06 DIAGNOSIS — I739 Peripheral vascular disease, unspecified: Secondary | ICD-10-CM | POA: Diagnosis not present

## 2017-05-08 DIAGNOSIS — R278 Other lack of coordination: Secondary | ICD-10-CM | POA: Diagnosis not present

## 2017-05-08 DIAGNOSIS — M25542 Pain in joints of left hand: Secondary | ICD-10-CM | POA: Diagnosis not present

## 2017-05-08 DIAGNOSIS — M25541 Pain in joints of right hand: Secondary | ICD-10-CM | POA: Diagnosis not present

## 2017-05-08 DIAGNOSIS — R260 Ataxic gait: Secondary | ICD-10-CM | POA: Diagnosis not present

## 2017-05-08 DIAGNOSIS — I4891 Unspecified atrial fibrillation: Secondary | ICD-10-CM | POA: Diagnosis not present

## 2017-05-08 DIAGNOSIS — W010XXD Fall on same level from slipping, tripping and stumbling without subsequent striking against object, subsequent encounter: Secondary | ICD-10-CM | POA: Diagnosis not present

## 2017-05-09 DIAGNOSIS — R260 Ataxic gait: Secondary | ICD-10-CM | POA: Diagnosis not present

## 2017-05-09 DIAGNOSIS — R278 Other lack of coordination: Secondary | ICD-10-CM | POA: Diagnosis not present

## 2017-05-09 DIAGNOSIS — M25542 Pain in joints of left hand: Secondary | ICD-10-CM | POA: Diagnosis not present

## 2017-05-09 DIAGNOSIS — M25541 Pain in joints of right hand: Secondary | ICD-10-CM | POA: Diagnosis not present

## 2017-05-09 DIAGNOSIS — I4891 Unspecified atrial fibrillation: Secondary | ICD-10-CM | POA: Diagnosis not present

## 2017-05-09 DIAGNOSIS — W010XXD Fall on same level from slipping, tripping and stumbling without subsequent striking against object, subsequent encounter: Secondary | ICD-10-CM | POA: Diagnosis not present

## 2017-05-10 DIAGNOSIS — R278 Other lack of coordination: Secondary | ICD-10-CM | POA: Diagnosis not present

## 2017-05-10 DIAGNOSIS — W010XXD Fall on same level from slipping, tripping and stumbling without subsequent striking against object, subsequent encounter: Secondary | ICD-10-CM | POA: Diagnosis not present

## 2017-05-10 DIAGNOSIS — M25541 Pain in joints of right hand: Secondary | ICD-10-CM | POA: Diagnosis not present

## 2017-05-10 DIAGNOSIS — M25542 Pain in joints of left hand: Secondary | ICD-10-CM | POA: Diagnosis not present

## 2017-05-10 DIAGNOSIS — R260 Ataxic gait: Secondary | ICD-10-CM | POA: Diagnosis not present

## 2017-05-10 DIAGNOSIS — I4891 Unspecified atrial fibrillation: Secondary | ICD-10-CM | POA: Diagnosis not present

## 2017-05-15 DIAGNOSIS — F411 Generalized anxiety disorder: Secondary | ICD-10-CM | POA: Diagnosis not present

## 2017-05-17 DIAGNOSIS — M069 Rheumatoid arthritis, unspecified: Secondary | ICD-10-CM | POA: Diagnosis not present

## 2017-05-17 DIAGNOSIS — I10 Essential (primary) hypertension: Secondary | ICD-10-CM | POA: Diagnosis not present

## 2017-05-17 DIAGNOSIS — I4891 Unspecified atrial fibrillation: Secondary | ICD-10-CM | POA: Diagnosis not present

## 2017-05-17 DIAGNOSIS — M159 Polyosteoarthritis, unspecified: Secondary | ICD-10-CM | POA: Diagnosis not present

## 2017-05-29 DIAGNOSIS — R600 Localized edema: Secondary | ICD-10-CM | POA: Diagnosis not present

## 2017-05-29 DIAGNOSIS — I739 Peripheral vascular disease, unspecified: Secondary | ICD-10-CM | POA: Diagnosis not present

## 2017-05-29 DIAGNOSIS — S81802A Unspecified open wound, left lower leg, initial encounter: Secondary | ICD-10-CM | POA: Diagnosis not present

## 2017-05-31 DIAGNOSIS — Z79899 Other long term (current) drug therapy: Secondary | ICD-10-CM | POA: Diagnosis not present

## 2017-05-31 DIAGNOSIS — E038 Other specified hypothyroidism: Secondary | ICD-10-CM | POA: Diagnosis not present

## 2017-06-02 DIAGNOSIS — F411 Generalized anxiety disorder: Secondary | ICD-10-CM | POA: Diagnosis not present

## 2017-06-02 DIAGNOSIS — F330 Major depressive disorder, recurrent, mild: Secondary | ICD-10-CM | POA: Diagnosis not present

## 2017-06-02 DIAGNOSIS — G3184 Mild cognitive impairment, so stated: Secondary | ICD-10-CM | POA: Diagnosis not present

## 2017-06-09 DIAGNOSIS — J449 Chronic obstructive pulmonary disease, unspecified: Secondary | ICD-10-CM | POA: Diagnosis not present

## 2017-06-09 DIAGNOSIS — M069 Rheumatoid arthritis, unspecified: Secondary | ICD-10-CM | POA: Diagnosis not present

## 2017-06-09 DIAGNOSIS — I10 Essential (primary) hypertension: Secondary | ICD-10-CM | POA: Diagnosis not present

## 2017-06-09 DIAGNOSIS — M159 Polyosteoarthritis, unspecified: Secondary | ICD-10-CM | POA: Diagnosis not present

## 2017-06-19 DIAGNOSIS — F330 Major depressive disorder, recurrent, mild: Secondary | ICD-10-CM | POA: Diagnosis not present

## 2017-06-19 DIAGNOSIS — F411 Generalized anxiety disorder: Secondary | ICD-10-CM | POA: Diagnosis not present

## 2017-06-24 DIAGNOSIS — R05 Cough: Secondary | ICD-10-CM | POA: Diagnosis not present

## 2017-06-30 DIAGNOSIS — R0902 Hypoxemia: Secondary | ICD-10-CM | POA: Diagnosis not present

## 2017-06-30 DIAGNOSIS — R918 Other nonspecific abnormal finding of lung field: Secondary | ICD-10-CM | POA: Diagnosis not present

## 2017-06-30 DIAGNOSIS — J209 Acute bronchitis, unspecified: Secondary | ICD-10-CM | POA: Diagnosis not present

## 2017-06-30 DIAGNOSIS — I517 Cardiomegaly: Secondary | ICD-10-CM | POA: Diagnosis not present

## 2017-07-06 DIAGNOSIS — I4891 Unspecified atrial fibrillation: Secondary | ICD-10-CM | POA: Diagnosis not present

## 2017-07-06 DIAGNOSIS — I10 Essential (primary) hypertension: Secondary | ICD-10-CM | POA: Diagnosis not present

## 2017-07-06 DIAGNOSIS — M069 Rheumatoid arthritis, unspecified: Secondary | ICD-10-CM | POA: Diagnosis not present

## 2017-07-06 DIAGNOSIS — M159 Polyosteoarthritis, unspecified: Secondary | ICD-10-CM | POA: Diagnosis not present

## 2017-07-11 DIAGNOSIS — F330 Major depressive disorder, recurrent, mild: Secondary | ICD-10-CM | POA: Diagnosis not present

## 2017-07-11 DIAGNOSIS — F411 Generalized anxiety disorder: Secondary | ICD-10-CM | POA: Diagnosis not present

## 2017-07-17 DIAGNOSIS — F411 Generalized anxiety disorder: Secondary | ICD-10-CM | POA: Diagnosis not present

## 2017-07-17 DIAGNOSIS — F330 Major depressive disorder, recurrent, mild: Secondary | ICD-10-CM | POA: Diagnosis not present

## 2017-07-31 DIAGNOSIS — M069 Rheumatoid arthritis, unspecified: Secondary | ICD-10-CM | POA: Diagnosis not present

## 2017-07-31 DIAGNOSIS — M159 Polyosteoarthritis, unspecified: Secondary | ICD-10-CM | POA: Diagnosis not present

## 2017-08-03 DIAGNOSIS — M069 Rheumatoid arthritis, unspecified: Secondary | ICD-10-CM | POA: Diagnosis not present

## 2017-08-03 DIAGNOSIS — I4891 Unspecified atrial fibrillation: Secondary | ICD-10-CM | POA: Diagnosis not present

## 2017-08-03 DIAGNOSIS — I10 Essential (primary) hypertension: Secondary | ICD-10-CM | POA: Diagnosis not present

## 2017-08-03 DIAGNOSIS — M159 Polyosteoarthritis, unspecified: Secondary | ICD-10-CM | POA: Diagnosis not present

## 2017-08-21 DIAGNOSIS — M159 Polyosteoarthritis, unspecified: Secondary | ICD-10-CM | POA: Diagnosis not present

## 2017-08-21 DIAGNOSIS — M069 Rheumatoid arthritis, unspecified: Secondary | ICD-10-CM | POA: Diagnosis not present

## 2017-08-22 DIAGNOSIS — L84 Corns and callosities: Secondary | ICD-10-CM | POA: Diagnosis not present

## 2017-08-22 DIAGNOSIS — M79675 Pain in left toe(s): Secondary | ICD-10-CM | POA: Diagnosis not present

## 2017-08-22 DIAGNOSIS — I739 Peripheral vascular disease, unspecified: Secondary | ICD-10-CM | POA: Diagnosis not present

## 2017-08-22 DIAGNOSIS — B351 Tinea unguium: Secondary | ICD-10-CM | POA: Diagnosis not present

## 2017-08-30 DIAGNOSIS — I10 Essential (primary) hypertension: Secondary | ICD-10-CM | POA: Diagnosis not present

## 2017-08-30 DIAGNOSIS — I4891 Unspecified atrial fibrillation: Secondary | ICD-10-CM | POA: Diagnosis not present

## 2017-08-30 DIAGNOSIS — M159 Polyosteoarthritis, unspecified: Secondary | ICD-10-CM | POA: Diagnosis not present

## 2017-08-30 DIAGNOSIS — M069 Rheumatoid arthritis, unspecified: Secondary | ICD-10-CM | POA: Diagnosis not present

## 2017-09-07 DIAGNOSIS — F411 Generalized anxiety disorder: Secondary | ICD-10-CM | POA: Diagnosis not present

## 2017-09-07 DIAGNOSIS — F330 Major depressive disorder, recurrent, mild: Secondary | ICD-10-CM | POA: Diagnosis not present

## 2017-09-16 DIAGNOSIS — I10 Essential (primary) hypertension: Secondary | ICD-10-CM | POA: Diagnosis not present

## 2017-09-16 DIAGNOSIS — Z79899 Other long term (current) drug therapy: Secondary | ICD-10-CM | POA: Diagnosis not present

## 2017-09-16 DIAGNOSIS — D649 Anemia, unspecified: Secondary | ICD-10-CM | POA: Diagnosis not present

## 2017-09-16 DIAGNOSIS — E785 Hyperlipidemia, unspecified: Secondary | ICD-10-CM | POA: Diagnosis not present

## 2017-09-18 DIAGNOSIS — F330 Major depressive disorder, recurrent, mild: Secondary | ICD-10-CM | POA: Diagnosis not present

## 2017-09-18 DIAGNOSIS — F411 Generalized anxiety disorder: Secondary | ICD-10-CM | POA: Diagnosis not present

## 2017-09-21 DIAGNOSIS — M069 Rheumatoid arthritis, unspecified: Secondary | ICD-10-CM | POA: Diagnosis not present

## 2017-09-21 DIAGNOSIS — M159 Polyosteoarthritis, unspecified: Secondary | ICD-10-CM | POA: Diagnosis not present

## 2017-09-21 DIAGNOSIS — I4891 Unspecified atrial fibrillation: Secondary | ICD-10-CM | POA: Diagnosis not present

## 2017-09-21 DIAGNOSIS — I10 Essential (primary) hypertension: Secondary | ICD-10-CM | POA: Diagnosis not present

## 2017-09-25 DIAGNOSIS — F331 Major depressive disorder, recurrent, moderate: Secondary | ICD-10-CM | POA: Diagnosis not present

## 2017-10-18 DIAGNOSIS — I10 Essential (primary) hypertension: Secondary | ICD-10-CM | POA: Diagnosis not present

## 2017-10-18 DIAGNOSIS — I4891 Unspecified atrial fibrillation: Secondary | ICD-10-CM | POA: Diagnosis not present

## 2017-10-18 DIAGNOSIS — M159 Polyosteoarthritis, unspecified: Secondary | ICD-10-CM | POA: Diagnosis not present

## 2017-10-18 DIAGNOSIS — M069 Rheumatoid arthritis, unspecified: Secondary | ICD-10-CM | POA: Diagnosis not present

## 2017-10-23 DIAGNOSIS — F411 Generalized anxiety disorder: Secondary | ICD-10-CM | POA: Diagnosis not present

## 2017-10-23 DIAGNOSIS — F330 Major depressive disorder, recurrent, mild: Secondary | ICD-10-CM | POA: Diagnosis not present

## 2017-10-23 DIAGNOSIS — F331 Major depressive disorder, recurrent, moderate: Secondary | ICD-10-CM | POA: Diagnosis not present

## 2017-10-31 ENCOUNTER — Ambulatory Visit (INDEPENDENT_AMBULATORY_CARE_PROVIDER_SITE_OTHER): Payer: Medicare Other | Admitting: Cardiology

## 2017-10-31 ENCOUNTER — Encounter: Payer: Self-pay | Admitting: Cardiology

## 2017-10-31 ENCOUNTER — Encounter: Payer: Self-pay | Admitting: *Deleted

## 2017-10-31 VITALS — BP 100/57 | HR 67 | Wt 104.0 lb

## 2017-10-31 DIAGNOSIS — I10 Essential (primary) hypertension: Secondary | ICD-10-CM

## 2017-10-31 DIAGNOSIS — R600 Localized edema: Secondary | ICD-10-CM | POA: Diagnosis not present

## 2017-10-31 DIAGNOSIS — E782 Mixed hyperlipidemia: Secondary | ICD-10-CM | POA: Diagnosis not present

## 2017-10-31 DIAGNOSIS — I2510 Atherosclerotic heart disease of native coronary artery without angina pectoris: Secondary | ICD-10-CM | POA: Diagnosis not present

## 2017-10-31 NOTE — Progress Notes (Signed)
Clinical Summary Candice Meza is a 84 y.o.female seen today for follow up of the following medical problems.  1. CAD  - prior MI in 1997, non-obstructive disease by cath at that time.  - echo 2008 LVEF 60%, mild AI  - history of ASA allergy, has not been taking   - no recent chest pain. Some SOB at times. +cough, wheezing. Improves with her inhalers.   2. HTN  - compliant withmeds   3. Hyperlipidemia  -compliant with statin  4. LE edema - no recent troubles.      SH: eldest son Peyton Najjar lives in [place], [place]. John youngest son lives in [place].     Past Medical History:  Diagnosis Date  . Angina pectoris (HCC)   . Anxiety   . Ataxic gait   . Atherosclerotic heart disease   . Atrial fibrillation (HCC)   . Benign essential tremor   . COPD (chronic obstructive pulmonary disease) (HCC)   . Cough    Prolonged, possible asthmatic bronchitis  . Degenerative joint disease    versus rheumatoid arthritis  . Depression   . Disease of biliary tract   . Dysphagia   . GERD (gastroesophageal reflux disease)   . Hyperlipidemia   . Hypertension   . Hypokalemia   . Macular degeneration   . MI, acute, non ST segment elevation (HCC) 1997   Questionable lesion in the circumflex at catheterization; normal EF; chronic dyspnea without specific cause identified  . Osteopenia    Versus osteoporosis  . Postmenopausal    Estrogen replacement therapy  . Repeated falls   . Weakness      Allergies  Allergen Reactions  . Aspirin     GI symptoms  . Bee Venom   . Morphine And Related   . Sulfa Antibiotics     dyspnea  . Tetracyclines & Related   . Codeine Rash  . Pamelor Rash  . Penicillins Rash     Current Outpatient Medications  Medication Sig Dispense Refill  . acetaminophen (TYLENOL) 500 MG tablet Take 500 mg by mouth 2 (two) times daily.     [name] ALPRAZolam (XANAX) 0.25 MG tablet Take 0.25 mg by mouth 2 (two) times daily. For anxiety    . alum  & mag hydroxide-simeth (MYLANTA) 200-200-20 MG/5ML suspension Take 15 mLs by mouth at bedtime.    [name] azithromycin (ZITHROMAX) 250 MG tablet TAKE 2 TABLETS DAY 1 AND 1 TABLET DAILY FOR 4 DAYS 6 each 0  . Calcium Carb-Cholecalciferol (CALCIUM-VITAMIN D) 500-200 MG-UNIT tablet Take 1 tablet by mouth daily.    [name] dextromethorphan (DELSYM) 30 MG/5ML liquid Take 60 mg by mouth 2 (two) times daily as needed. For  cough    . fluticasone (FLONASE) 50 MCG/ACT nasal spray Place 2 sprays into the nose daily.      . furosemide (LASIX) 20 MG tablet Take 20 mg by mouth 2 (two) times daily.    [name] guaifenesin (HUMIBID E) 400 MG TABS tablet Take 600 mg by mouth 2 (two) times daily.     . INCRUSE ELLIPTA 62.5 MCG/INH AEPB Inhale 1 Inhaler into the lungs daily.    [name] levothyroxine (SYNTHROID, LEVOTHROID) 75 MCG tablet Take 75 mcg by mouth daily before breakfast.    . loratadine (CLARITIN) 10 MG tablet Take 10 mg by mouth daily.    [name] losartan (COZAAR) 25 MG tablet Take 0.5 tablets (12.5 mg total) by mouth daily. 45 tablet 1  . meloxicam (MOBIC) 7.5 MG  tablet Take 7.5 mg by mouth every morning.    . Multiple Vitamin (MULTIVITAMIN) tablet Take 1 tablet by mouth daily.      . nitroGLYCERIN (NITROSTAT) 0.4 MG SL tablet Place 0.4 mg under the tongue as directed.      Bertram Gala Glycol-Propyl Glycol (SYSTANE) 0.4-0.3 % SOLN Apply 1 drop to eye 3 (three) times daily.      . polyethylene glycol powder (GLYCOLAX/MIRALAX) powder Take 17 g by mouth daily.    . potassium chloride (KLOR-CON) 20 MEQ packet Take 20 mEq by mouth every morning.    . pravastatin (PRAVACHOL) 40 MG tablet Take 40 mg by mouth at bedtime.      . Psyllium Husk POWD Give 3.4 gram by mouth twice a day for bowel management - 1 teaspoon    . ranitidine (ZANTAC) 75 MG tablet Take 75 mg by mouth 2 (two) times daily.    . SYMBICORT 160-4.5 MCG/ACT inhaler Inhale 2 Inhalers into the lungs 2 (two) times daily.    . traMADol (ULTRAM) 50 MG tablet Take 1 tablet (50 mg  total) by mouth every 12 (twelve) hours as needed. For pain 30 tablet 0  . venlafaxine (EFFEXOR) 37.5 MG tablet Take 37.5 mg by mouth every morning. Takes with the 75mg .    . venlafaxine XR (EFFEXOR-XR) 75 MG 24 hr capsule Take 1 capsule by mouth every morning.      No current facility-administered medications for this visit.      Past Surgical History:  Procedure Laterality Date  . BLADDER SUSPENSION    . ERCP  04/18/2012   Procedure: ENDOSCOPIC RETROGRADE CHOLANGIOPANCREATOGRAPHY (ERCP);  Surgeon: Malissa Hippo, MD;  Location: [facility];  Service: Endoscopy;  Laterality: N/A;  Diagnostic  . FOOT SURGERY     Bilateral  . INGUINAL HERNIA REPAIR     Left     Allergies  Allergen Reactions  . Aspirin     GI symptoms  . Bee Venom   . Morphine And Related   . Sulfa Antibiotics     dyspnea  . Tetracyclines & Related   . Codeine Rash  . Pamelor Rash  . Penicillins Rash      Family History  Problem Relation Age of Onset  . Coronary artery disease Unknown   . Stroke Unknown      Social History [name] reports that she has never smoked. She has never used smokeless tobacco. [name] reports that she does not drink alcohol.   Review of Systems CONSTITUTIONAL: No weight loss, fever, chills, weakness or fatigue.  HEENT: Eyes: No visual loss, blurred vision, double vision or yellow sclerae.No hearing loss, sneezing, congestion, runny nose or sore throat.  SKIN: No rash or itching.  CARDIOVASCULAR: per hpi RESPIRATORY:per hpi GASTROINTESTINAL: No anorexia, nausea, vomiting or diarrhea. No abdominal pain or blood.  GENITOURINARY: No burning on urination, no polyuria NEUROLOGICAL: No headache, dizziness, syncope, paralysis, ataxia, numbness or tingling in the extremities. No change in bowel or bladder control.  MUSCULOSKELETAL: No muscle, back pain, joint pain or stiffness.  LYMPHATICS: No enlarged nodes. No history of splenectomy.  PSYCHIATRIC: No history of  depression or anxiety.  ENDOCRINOLOGIC: No reports of sweating, cold or heat intolerance. No polyuria or polydipsia.  [name]   Physical Examination Vitals:   10/31/17 1408  BP: (!) 100/57  Pulse: 67  SpO2: 95%   Vitals:   10/31/17 1408  Weight: 104 lb (47.2 kg)    Gen: resting comfortably, no acute distress  HEENT: no scleral icterus, pupils equal round and reactive, no palptable cervical adenopathy,  CV: RRR, 2/6 sysotlic murmur at apex, no jvd Resp: Clear to auscultation bilaterally GI: abdomen is soft, non-tender, non-distended, normal bowel sounds, no hepatosplenomegaly MSK: extremities are warm, no edema.  Skin: warm, no rash Neuro:  no focal deficits Psych: appropriate affect   Diagnostic Studies  10/29/13 Echo Study Conclusions  - Left ventricle: The cavity size was normal. Wall thickness was increased in a pattern of mild LVH. Systolic function was normal. The estimated ejection fraction was in the range of 55% to 60%. Wall motion was normal; there were no regional wall motion abnormalities. There was an increased relative contribution of atrial contraction to ventricular filling. Doppler parameters are consistent with abnormal left ventricular relaxation (grade 1 diastolic dysfunction). - Aortic valve: Mildly calcified annulus. Trileaflet; mildly thickened, mildly calcified leaflets. Moderate, centralregurgitation. Minimal aortic stenosis. Peak velocity: 205cm/s (S). Mean gradient: 72mm Hg (S). Valve area: 1.85cm^2(VTI). - Mitral valve: Calcified annulus. Mildly thickened leaflets . Mild regurgitation. - Left atrium: The atrium was mildly dilated. - Tricuspid valve: Mildly thickened leaflets. Mild-moderate regurgitation. - Pulmonary arteries: PA peak pressure: 58mm Hg (S). Mild to moderately elevated pulmonary pressures.     Assessment and Plan   1. CAD  - no symptoms, continue currentmeds - EKG today shows SR, no acute ischemic changes.   2. HTN   - at goal, continue current meds  3. Hyperlipidemia  -she will continue statin  4. LE edema - controlled, continue diuretic.        Antoine Poche, M.D.

## 2017-10-31 NOTE — Patient Instructions (Signed)

## 2017-11-05 ENCOUNTER — Encounter: Payer: Self-pay | Admitting: Cardiology

## 2017-11-16 DIAGNOSIS — I4891 Unspecified atrial fibrillation: Secondary | ICD-10-CM | POA: Diagnosis not present

## 2017-11-16 DIAGNOSIS — M069 Rheumatoid arthritis, unspecified: Secondary | ICD-10-CM | POA: Diagnosis not present

## 2017-11-16 DIAGNOSIS — M159 Polyosteoarthritis, unspecified: Secondary | ICD-10-CM | POA: Diagnosis not present

## 2017-11-16 DIAGNOSIS — I10 Essential (primary) hypertension: Secondary | ICD-10-CM | POA: Diagnosis not present

## 2017-11-20 DIAGNOSIS — F331 Major depressive disorder, recurrent, moderate: Secondary | ICD-10-CM | POA: Diagnosis not present

## 2017-11-29 DIAGNOSIS — R278 Other lack of coordination: Secondary | ICD-10-CM | POA: Diagnosis not present

## 2017-11-29 DIAGNOSIS — M25511 Pain in right shoulder: Secondary | ICD-10-CM | POA: Diagnosis not present

## 2017-11-29 DIAGNOSIS — M25542 Pain in joints of left hand: Secondary | ICD-10-CM | POA: Diagnosis not present

## 2017-11-29 DIAGNOSIS — M25541 Pain in joints of right hand: Secondary | ICD-10-CM | POA: Diagnosis not present

## 2017-11-29 DIAGNOSIS — M25521 Pain in right elbow: Secondary | ICD-10-CM | POA: Diagnosis not present

## 2017-11-29 DIAGNOSIS — I4891 Unspecified atrial fibrillation: Secondary | ICD-10-CM | POA: Diagnosis not present

## 2017-11-30 DIAGNOSIS — I4891 Unspecified atrial fibrillation: Secondary | ICD-10-CM | POA: Diagnosis not present

## 2017-11-30 DIAGNOSIS — R278 Other lack of coordination: Secondary | ICD-10-CM | POA: Diagnosis not present

## 2017-11-30 DIAGNOSIS — M25541 Pain in joints of right hand: Secondary | ICD-10-CM | POA: Diagnosis not present

## 2017-11-30 DIAGNOSIS — M25542 Pain in joints of left hand: Secondary | ICD-10-CM | POA: Diagnosis not present

## 2017-12-01 DIAGNOSIS — M25541 Pain in joints of right hand: Secondary | ICD-10-CM | POA: Diagnosis not present

## 2017-12-01 DIAGNOSIS — I4891 Unspecified atrial fibrillation: Secondary | ICD-10-CM | POA: Diagnosis not present

## 2017-12-01 DIAGNOSIS — R278 Other lack of coordination: Secondary | ICD-10-CM | POA: Diagnosis not present

## 2017-12-01 DIAGNOSIS — M25542 Pain in joints of left hand: Secondary | ICD-10-CM | POA: Diagnosis not present

## 2017-12-04 DIAGNOSIS — R441 Visual hallucinations: Secondary | ICD-10-CM | POA: Diagnosis not present

## 2017-12-04 DIAGNOSIS — R4789 Other speech disturbances: Secondary | ICD-10-CM | POA: Diagnosis not present

## 2017-12-04 DIAGNOSIS — H02402 Unspecified ptosis of left eyelid: Secondary | ICD-10-CM | POA: Diagnosis not present

## 2018-05-04 ENCOUNTER — Encounter: Payer: Self-pay | Admitting: *Deleted

## 2018-05-07 ENCOUNTER — Encounter: Payer: Self-pay | Admitting: Cardiology

## 2018-05-07 ENCOUNTER — Ambulatory Visit (INDEPENDENT_AMBULATORY_CARE_PROVIDER_SITE_OTHER): Payer: Medicare Other | Admitting: Cardiology

## 2018-05-07 VITALS — BP 114/68 | HR 72 | Ht <= 58 in | Wt 98.0 lb

## 2018-05-07 DIAGNOSIS — I2510 Atherosclerotic heart disease of native coronary artery without angina pectoris: Secondary | ICD-10-CM | POA: Diagnosis not present

## 2018-05-07 DIAGNOSIS — E782 Mixed hyperlipidemia: Secondary | ICD-10-CM | POA: Diagnosis not present

## 2018-05-07 DIAGNOSIS — I10 Essential (primary) hypertension: Secondary | ICD-10-CM | POA: Diagnosis not present

## 2018-05-07 DIAGNOSIS — R600 Localized edema: Secondary | ICD-10-CM

## 2018-05-07 NOTE — Progress Notes (Signed)
Clinical Summary Candice Meza is a 84 y.o.female  1. CAD  - prior MI in 1997, non-obstructive disease by cath at that time.  - echo 2008 LVEF 60%, mild AI  - history of ASA allergy, has not been taking   - no recent chest pain. Some SOB at times. +cough, wheezing. Improves with her inhalers.  - dull pain across entire chest, 2/10 in severity. Mainly with getting excited. Recently lossed her brother which caused the symptoms.  - compliant with meds  2. HTN  - she is compliant with meds   3. Hyperlipidemia  - had labs last week at nursing home.   4. LE edema -mild edema at times. No recent SOB/DOE, no orthopnea     SH: eldest son Peyton Najjar lives in [place], [place]. John youngest son lives in [place].   Past Medical History:  Diagnosis Date  . Angina pectoris (HCC)   . Anxiety   . Ataxic gait   . Atherosclerotic heart disease   . Atrial fibrillation (HCC)   . Benign essential tremor   . COPD (chronic obstructive pulmonary disease) (HCC)   . Cough    Prolonged, possible asthmatic bronchitis  . Degenerative joint disease    versus rheumatoid arthritis  . Depression   . Disease of biliary tract   . Dysphagia   . GERD (gastroesophageal reflux disease)   . Hyperlipidemia   . Hypertension   . Hypokalemia   . Macular degeneration   . MI, acute, non ST segment elevation (HCC) 1997   Questionable lesion in the circumflex at catheterization; normal EF; chronic dyspnea without specific cause identified  . Osteopenia    Versus osteoporosis  . Postmenopausal    Estrogen replacement therapy  . Repeated falls   . Weakness      Allergies  Allergen Reactions  . Aspirin     GI symptoms  . Bee Venom   . Morphine And Related   . Sulfa Antibiotics     dyspnea  . Tetracyclines & Related   . Codeine Rash  . Pamelor Rash  . Penicillins Rash     Current Outpatient Medications  Medication Sig Dispense Refill  . acetaminophen (TYLENOL) 500 MG  tablet Take 500 mg by mouth 2 (two) times daily.     [name] ALPRAZolam (XANAX) 0.25 MG tablet Take 0.25 mg by mouth 2 (two) times daily. For anxiety    . alum & mag hydroxide-simeth (MYLANTA) 200-200-20 MG/5ML suspension Take 15 mLs by mouth at bedtime.    . busPIRone (BUSPAR) 10 MG tablet Take 10 mg by mouth 2 (two) times daily.    . Calcium Carb-Cholecalciferol (CALCIUM-VITAMIN D) 500-200 MG-UNIT tablet Take 1 tablet by mouth daily.    [name] dextromethorphan (DELSYM) 30 MG/5ML liquid Take 60 mg by mouth 2 (two) times daily as needed. For  cough    . fluticasone (FLONASE) 50 MCG/ACT nasal spray Place 2 sprays into the nose daily.      . furosemide (LASIX) 20 MG tablet Take 20 mg by mouth 2 (two) times daily.    [name] guaifenesin (HUMIBID E) 400 MG TABS tablet Take 600 mg by mouth 2 (two) times daily.     . INCRUSE ELLIPTA 62.5 MCG/INH AEPB Inhale 1 Inhaler into the lungs daily.    [name] levothyroxine (SYNTHROID, LEVOTHROID) 75 MCG tablet Take 75 mcg by mouth daily before breakfast.    . loratadine (CLARITIN) 10 MG tablet Take 10 mg by mouth daily.    [name]  losartan (COZAAR) 25 MG tablet Take 0.5 tablets (12.5 mg total) by mouth daily. 45 tablet 1  . meloxicam (MOBIC) 7.5 MG tablet Take 7.5 mg by mouth every morning.    . Multiple Vitamin (MULTIVITAMIN) tablet Take 1 tablet by mouth daily.      . nitroGLYCERIN (NITROSTAT) 0.4 MG SL tablet Place 0.4 mg under the tongue as directed.      Bertram Gala Glycol-Propyl Glycol (SYSTANE) 0.4-0.3 % SOLN Apply 1 drop to eye 3 (three) times daily.      . polyethylene glycol powder (GLYCOLAX/MIRALAX) powder Take 17 g by mouth daily.    . potassium chloride (KLOR-CON) 20 MEQ packet Take 20 mEq by mouth every morning.    . pravastatin (PRAVACHOL) 40 MG tablet Take 40 mg by mouth at bedtime.      . Psyllium Husk POWD Give 3.4 gram by mouth twice a day for bowel management - 1 teaspoon    . ranitidine (ZANTAC) 75 MG tablet Take 75 mg by mouth 2 (two) times daily.    . SYMBICORT  160-4.5 MCG/ACT inhaler Inhale 2 Inhalers into the lungs 2 (two) times daily.    . traMADol (ULTRAM) 50 MG tablet Take 1 tablet (50 mg total) by mouth every 12 (twelve) hours as needed. For pain 30 tablet 0  . venlafaxine (EFFEXOR) 37.5 MG tablet Take 37.5 mg by mouth every morning. Takes with the 75mg .    . venlafaxine XR (EFFEXOR-XR) 75 MG 24 hr capsule Take 1 capsule by mouth every morning.      No current facility-administered medications for this visit.      Past Surgical History:  Procedure Laterality Date  . BLADDER SUSPENSION    . ERCP  04/18/2012   Procedure: ENDOSCOPIC RETROGRADE CHOLANGIOPANCREATOGRAPHY (ERCP);  Surgeon: 04/20/2012, MD;  Location: [facility];  Service: Endoscopy;  Laterality: N/A;  Diagnostic  . FOOT SURGERY     Bilateral  . INGUINAL HERNIA REPAIR     Left     Allergies  Allergen Reactions  . Aspirin     GI symptoms  . Bee Venom   . Morphine And Related   . Sulfa Antibiotics     dyspnea  . Tetracyclines & Related   . Codeine Rash  . Pamelor Rash  . Penicillins Rash      Family History  Problem Relation Age of Onset  . Coronary artery disease Unknown   . Stroke Unknown      Social History [name] reports that she has never smoked. She has never used smokeless tobacco. [name] reports that she does not drink alcohol.   Review of Systems CONSTITUTIONAL: No weight loss, fever, chills, weakness or fatigue.  HEENT: Eyes: No visual loss, blurred vision, double vision or yellow sclerae.No hearing loss, sneezing, congestion, runny nose or sore throat.  SKIN: No rash or itching.  CARDIOVASCULAR: no chest pain, no palpitations.  RESPIRATORY: No shortness of breath, cough or sputum.  GASTROINTESTINAL: No anorexia, nausea, vomiting or diarrhea. No abdominal pain or blood.  GENITOURINARY: No burning on urination, no polyuria NEUROLOGICAL: No headache, dizziness, syncope, paralysis, ataxia, numbness or tingling in the extremities.  No change in bowel or bladder control.  MUSCULOSKELETAL: No muscle, back pain, joint pain or stiffness.  LYMPHATICS: No enlarged nodes. No history of splenectomy.  PSYCHIATRIC: No history of depression or anxiety.  ENDOCRINOLOGIC: No reports of sweating, cold or heat intolerance. No polyuria or polydipsia.  Buford Dresser   Physical Examination Vitals:  05/07/18 1318  BP: 114/68  Pulse: 72  SpO2: 95%   Vitals:   05/07/18 1318  Weight: 98 lb (44.5 kg)  Height: 4' (1.219 m)    Gen: resting comfortably, no acute distress HEENT: no scleral icterus, pupils equal round and reactive, no palptable cervical adenopathy,  CV: RRR, 2/6 systolic murmur rusb, no jvd Resp: Clear to auscultation bilaterally GI: abdomen is soft, non-tender, non-distended, normal bowel sounds, no hepatosplenomegaly MSK: extremities are warm, no edema.  Skin: warm, no rash Neuro:  no focal deficits Psych: appropriate affect   Diagnostic Studies 10/29/13 Echo Study Conclusions  - Left ventricle: The cavity size was normal. Wall thickness was increased in a pattern of mild LVH. Systolic function was normal. The estimated ejection fraction was in the range of 55% to 60%. Wall motion was normal; there were no regional wall motion abnormalities. There was an increased relative contribution of atrial contraction to ventricular filling. Doppler parameters are consistent with abnormal left ventricular relaxation (grade 1 diastolic dysfunction). - Aortic valve: Mildly calcified annulus. Trileaflet; mildly thickened, mildly calcified leaflets. Moderate, centralregurgitation. Minimal aortic stenosis. Peak velocity: 205cm/s (S). Mean gradient: 44mm Hg (S). Valve area: 1.85cm^2(VTI). - Mitral valve: Calcified annulus. Mildly thickened leaflets . Mild regurgitation. - Left atrium: The atrium was mildly dilated. - Tricuspid valve: Mildly thickened leaflets. Mild-moderate regurgitation. - Pulmonary arteries: PA peak pressure:  75mm Hg (S). Mild to moderately elevated pulmonary pressures.      Assessment and Plan  1. CAD  -recent atypical symptoms primarily at the time of the loss of her brother. - continue current meds.   2. HTN  - she is at goal, continue current meds  3. Hyperlipidemia  - request labs from nursing home, continue statin.   4. LE edema - mild edema without any other symptoms, continue compression stockings and diuretic. Would not adjust diuretic at this time based on degree of swelling, high risk for medication side effects given her advanced age and fragility.   F/u 6 months   Antoine Poche, M.D.

## 2018-05-07 NOTE — Patient Instructions (Addendum)

## 2018-06-06 DIAGNOSIS — M069 Rheumatoid arthritis, unspecified: Secondary | ICD-10-CM | POA: Diagnosis not present

## 2018-06-06 DIAGNOSIS — J302 Other seasonal allergic rhinitis: Secondary | ICD-10-CM | POA: Diagnosis not present

## 2018-06-06 DIAGNOSIS — I4891 Unspecified atrial fibrillation: Secondary | ICD-10-CM | POA: Diagnosis not present

## 2018-06-06 DIAGNOSIS — F419 Anxiety disorder, unspecified: Secondary | ICD-10-CM | POA: Diagnosis not present

## 2018-06-06 DIAGNOSIS — I2510 Atherosclerotic heart disease of native coronary artery without angina pectoris: Secondary | ICD-10-CM | POA: Diagnosis not present

## 2018-06-06 DIAGNOSIS — I739 Peripheral vascular disease, unspecified: Secondary | ICD-10-CM | POA: Diagnosis not present

## 2018-06-06 DIAGNOSIS — K259 Gastric ulcer, unspecified as acute or chronic, without hemorrhage or perforation: Secondary | ICD-10-CM | POA: Diagnosis not present

## 2018-06-06 DIAGNOSIS — K5904 Chronic idiopathic constipation: Secondary | ICD-10-CM | POA: Diagnosis not present

## 2018-06-06 DIAGNOSIS — E039 Hypothyroidism, unspecified: Secondary | ICD-10-CM | POA: Diagnosis not present

## 2018-06-06 DIAGNOSIS — R4182 Altered mental status, unspecified: Secondary | ICD-10-CM | POA: Diagnosis not present

## 2018-06-06 DIAGNOSIS — H548 Legal blindness, as defined in USA: Secondary | ICD-10-CM | POA: Diagnosis not present

## 2018-06-06 DIAGNOSIS — M25542 Pain in joints of left hand: Secondary | ICD-10-CM | POA: Diagnosis not present

## 2018-06-06 DIAGNOSIS — F411 Generalized anxiety disorder: Secondary | ICD-10-CM | POA: Diagnosis not present

## 2018-06-06 DIAGNOSIS — K210 Gastro-esophageal reflux disease with esophagitis: Secondary | ICD-10-CM | POA: Diagnosis not present

## 2018-06-06 DIAGNOSIS — F331 Major depressive disorder, recurrent, moderate: Secondary | ICD-10-CM | POA: Diagnosis not present

## 2018-06-06 DIAGNOSIS — H3530 Unspecified macular degeneration: Secondary | ICD-10-CM | POA: Diagnosis not present

## 2018-06-06 DIAGNOSIS — E440 Moderate protein-calorie malnutrition: Secondary | ICD-10-CM | POA: Diagnosis not present

## 2018-06-06 DIAGNOSIS — G3184 Mild cognitive impairment, so stated: Secondary | ICD-10-CM | POA: Diagnosis not present

## 2018-06-06 DIAGNOSIS — K920 Hematemesis: Secondary | ICD-10-CM | POA: Diagnosis not present

## 2018-06-06 DIAGNOSIS — M159 Polyosteoarthritis, unspecified: Secondary | ICD-10-CM | POA: Diagnosis not present

## 2018-06-06 DIAGNOSIS — K922 Gastrointestinal hemorrhage, unspecified: Secondary | ICD-10-CM | POA: Diagnosis not present

## 2018-06-06 DIAGNOSIS — M25541 Pain in joints of right hand: Secondary | ICD-10-CM | POA: Diagnosis not present

## 2018-06-06 DIAGNOSIS — I10 Essential (primary) hypertension: Secondary | ICD-10-CM | POA: Diagnosis not present

## 2018-06-06 DIAGNOSIS — J449 Chronic obstructive pulmonary disease, unspecified: Secondary | ICD-10-CM | POA: Diagnosis not present

## 2018-06-06 DIAGNOSIS — I5032 Chronic diastolic (congestive) heart failure: Secondary | ICD-10-CM | POA: Diagnosis not present

## 2018-06-06 DIAGNOSIS — R278 Other lack of coordination: Secondary | ICD-10-CM | POA: Diagnosis not present

## 2018-06-06 DIAGNOSIS — E785 Hyperlipidemia, unspecified: Secondary | ICD-10-CM | POA: Diagnosis not present

## 2018-06-06 DIAGNOSIS — I110 Hypertensive heart disease with heart failure: Secondary | ICD-10-CM | POA: Diagnosis not present

## 2018-11-20 ENCOUNTER — Encounter: Payer: Self-pay | Admitting: Cardiology

## 2018-11-20 ENCOUNTER — Telehealth (INDEPENDENT_AMBULATORY_CARE_PROVIDER_SITE_OTHER): Payer: Medicare Other | Admitting: Cardiology

## 2018-11-20 ENCOUNTER — Encounter: Payer: Self-pay | Admitting: *Deleted

## 2018-11-20 VITALS — BP 105/55 | HR 71 | Resp 18 | Ht 59.0 in | Wt 99.0 lb

## 2018-11-20 DIAGNOSIS — R600 Localized edema: Secondary | ICD-10-CM

## 2018-11-20 DIAGNOSIS — E782 Mixed hyperlipidemia: Secondary | ICD-10-CM

## 2018-11-20 DIAGNOSIS — I2510 Atherosclerotic heart disease of native coronary artery without angina pectoris: Secondary | ICD-10-CM

## 2018-11-20 DIAGNOSIS — I10 Essential (primary) hypertension: Secondary | ICD-10-CM

## 2018-11-20 NOTE — Patient Instructions (Signed)
Your physician wants you to follow-up in: 6 MONTHS WITH DR. BRANCH. You will receive a reminder letter in the mail two months in advance. If you don't receive a letter, please call our office to schedule the follow-up appointment.  Your physician recommends that you continue on your current medications as directed. Please refer to the Current Medication list given to you today.  

## 2018-11-20 NOTE — Progress Notes (Signed)
Virtual Visit via Telephone Note   This visit type was conducted due to national recommendations for restrictions regarding the COVID-19 Pandemic (e.g. social distancing) in an effort to limit this patient's exposure and mitigate transmission in our community.  Due to her co-morbid illnesses, this patient is at least at moderate risk for complications without adequate follow up.  This format is felt to be most appropriate for this patient at this time.  The patient did not have access to video technology/had technical difficulties with video requiring transitioning to audio format only (telephone).  All issues noted in this document were discussed and addressed.  No physical exam could be performed with this format.  Please refer to the patient's chart for her  consent to telehealth for [facility].   Date:  11/20/2018   ID:  Candice DesanctisMary C Meza, DOB July 22, 1921, MRN [id number]  Patient Location: Home Provider Location: Home  PCP:  Timmie FoersterParsons, James B, MD  Cardiologist:  Dina RichBranch, Nasri Boakye, MD  Electrophysiologist:  None   Evaluation Performed:  Follow-Up Visit  Chief Complaint:  6 month follow up  History of Present Illness:    Candice Meza is a 85 y.o. female seen today for follow up of the following medical problems.   1. CAD  - prior MI in 1997, non-obstructive disease by cath at that time.  - echo 2008 LVEF 60%, mild AI    - no chest pain. No significant SOb/DOE - compliant with meds  2. HTN  -compliant with meds  3. Hyperlipidemia  - compliant with statin, labs followed by pcp  4. LE edema  - ongoing edema, better with elevation. Wears compression stockings.  - no SOB or DOE, no orthopnea   SH: eldest son Candice Meza lives in [place], [place]. John youngest son lives in [place].     The patient does not have symptoms concerning for COVID-19 infection (fever, chills, cough, or new shortness of breath).    Past Medical History:  Diagnosis Date  .  Angina pectoris (HCC)   . Anxiety   . Ataxic gait   . Atherosclerotic heart disease   . Atrial fibrillation (HCC)   . Benign essential tremor   . COPD (chronic obstructive pulmonary disease) (HCC)   . Cough    Prolonged, possible asthmatic bronchitis  . Degenerative joint disease    versus rheumatoid arthritis  . Depression   . Disease of biliary tract   . Dysphagia   . GERD (gastroesophageal reflux disease)   . Hyperlipidemia   . Hypertension   . Hypokalemia   . Macular degeneration   . MI, acute, non ST segment elevation (HCC) 1997   Questionable lesion in the circumflex at catheterization; normal EF; chronic dyspnea without specific cause identified  . Osteopenia    Versus osteoporosis  . Postmenopausal    Estrogen replacement therapy  . Repeated falls   . Weakness    Past Surgical History:  Procedure Laterality Date  . BLADDER SUSPENSION    . ERCP  04/18/2012   Procedure: ENDOSCOPIC RETROGRADE CHOLANGIOPANCREATOGRAPHY (ERCP);  Surgeon: Malissa HippoNajeeb U Rehman, MD;  Location: [facility];  Service: Endoscopy;  Laterality: N/A;  Diagnostic  . FOOT SURGERY     Bilateral  . INGUINAL HERNIA REPAIR     Left     No outpatient medications have been marked as taking for the 11/20/18 encounter (Appointment) with Antoine PocheBranch, Arkel Cartwright F, MD.     Allergies:   Aspirin; Bee venom; Morphine and related; Sulfa antibiotics; Tetracyclines &  related; Codeine; Pamelor; and Penicillins   Social History   Tobacco Use  . Smoking status: Never Smoker  . Smokeless tobacco: Never Used  Substance Use Topics  . Alcohol use: No    Alcohol/week: 0.0 standard drinks  . Drug use: No     Family Hx: The patient's family history includes Coronary artery disease in her unknown relative; Stroke in her unknown relative.  ROS:   Please see the history of present illness.     All other systems reviewed and are negative.   Prior CV studies:   The following studies were reviewed today:  10/29/13 Echo Study  Conclusions  - Left ventricle: The cavity size was normal. Wall thickness was increased in a pattern of mild LVH. Systolic function was normal. The estimated ejection fraction was in the range of 55% to 60%. Wall motion was normal; there were no regional wall motion abnormalities. There was an increased relative contribution of atrial contraction to ventricular filling. Doppler parameters are consistent with abnormal left ventricular relaxation (grade 1 diastolic dysfunction). - Aortic valve: Mildly calcified annulus. Trileaflet; mildly thickened, mildly calcified leaflets. Moderate, centralregurgitation. Minimal aortic stenosis. Peak velocity: 205cm/s (S). Mean gradient: 22mm Hg (S). Valve area: 1.85cm^2(VTI). - Mitral valve: Calcified annulus. Mildly thickened leaflets . Mild regurgitation. - Left atrium: The atrium was mildly dilated. - Tricuspid valve: Mildly thickened leaflets. Mild-moderate regurgitation. - Pulmonary arteries: PA peak pressure: 29mm Hg (S). Mild to moderately elevated pulmonary pressures.   Labs/Other Tests and Data Reviewed:    EKG:  No ECG reviewed.  Recent Labs: No results found for requested labs within last 8760 hours.   Recent Lipid Panel Lab Results  Component Value Date/Time   CHOL 144 05/07/2009 09:35 PM   TRIG 117 05/07/2009 09:35 PM   HDL 54 05/07/2009 09:35 PM   CHOLHDL 2.7 Ratio 05/07/2009 09:35 PM   LDLCALC 67 05/07/2009 09:35 PM    Wt Readings from Last 3 Encounters:  05/07/18 98 lb (44.5 kg)  10/31/17 104 lb (47.2 kg)  04/26/17 105 lb 9.6 oz (47.9 kg)     Objective:    Vital Signs:   Today's Vitals   11/20/18 0931  BP: (!) 105/55  Pulse: 71  Resp: 18  Weight: 99 lb (44.9 kg)  Height: 4\' 11"  (1.499 m)   Body mass index is 20 kg/m.  Normal affect. Normal speech pattern and tone. Comfortable, no apparent distress. No audible signs of SOB or wheezing.   ASSESSMENT & PLAN:    1. CAD  -no recent symptoms, continue  current meds  2. HTN  =-at goal, continue current meds  3. Hyperlipidemia  -get labs from pcp, continue statin  4. LE edema -chronic LE edema likely main component is venous insufficiency. No SOB or DOE. Avoid aggressive diuretics, continue current therapy    F/u 6 months    COVID-19 Education: The signs and symptoms of COVID-19 were discussed with the patient and how to seek care for testing (follow up with PCP or arrange E-visit).  The importance of social distancing was discussed today.  Time:   Today, I have spent 16 minutes with the patient with telehealth technology discussing the above problems.     Medication Adjustments/Labs and Tests Ordered: Current medicines are reviewed at length with the patient today.  Concerns regarding medicines are outlined above.   Tests Ordered: No orders of the defined types were placed in this encounter.   Medication Changes: No orders of the defined types were  placed in this encounter.   Disposition:  Follow up 6 months  Signed, Dina Rich, MD  11/20/2018 9:07 AM    [facility]

## 2019-02-03 DIAGNOSIS — I5033 Acute on chronic diastolic (congestive) heart failure: Secondary | ICD-10-CM | POA: Diagnosis not present

## 2019-02-03 DIAGNOSIS — K920 Hematemesis: Secondary | ICD-10-CM | POA: Diagnosis not present

## 2019-02-03 DIAGNOSIS — E039 Hypothyroidism, unspecified: Secondary | ICD-10-CM | POA: Diagnosis not present

## 2019-02-03 DIAGNOSIS — I2510 Atherosclerotic heart disease of native coronary artery without angina pectoris: Secondary | ICD-10-CM | POA: Diagnosis not present

## 2019-02-04 DIAGNOSIS — I5033 Acute on chronic diastolic (congestive) heart failure: Secondary | ICD-10-CM | POA: Diagnosis not present

## 2019-02-04 DIAGNOSIS — E039 Hypothyroidism, unspecified: Secondary | ICD-10-CM | POA: Diagnosis not present

## 2019-02-04 DIAGNOSIS — K920 Hematemesis: Secondary | ICD-10-CM | POA: Diagnosis not present

## 2019-02-04 DIAGNOSIS — I2510 Atherosclerotic heart disease of native coronary artery without angina pectoris: Secondary | ICD-10-CM | POA: Diagnosis not present

## 2019-02-05 DIAGNOSIS — Z743 Need for continuous supervision: Secondary | ICD-10-CM | POA: Diagnosis not present

## 2019-02-05 DIAGNOSIS — R279 Unspecified lack of coordination: Secondary | ICD-10-CM | POA: Diagnosis not present

## 2019-02-05 DIAGNOSIS — R404 Transient alteration of awareness: Secondary | ICD-10-CM | POA: Diagnosis not present

## 2019-02-05 DIAGNOSIS — R1013 Epigastric pain: Secondary | ICD-10-CM | POA: Diagnosis not present

## 2019-02-05 DIAGNOSIS — R0902 Hypoxemia: Secondary | ICD-10-CM | POA: Diagnosis not present

## 2019-11-05 ENCOUNTER — Encounter: Payer: Self-pay | Admitting: *Deleted

## 2019-11-06 ENCOUNTER — Encounter: Payer: Self-pay | Admitting: Cardiology

## 2019-11-06 ENCOUNTER — Telehealth (INDEPENDENT_AMBULATORY_CARE_PROVIDER_SITE_OTHER): Payer: Medicare Other | Admitting: Cardiology

## 2019-11-06 VITALS — BP 124/90 | HR 64 | Ht 59.0 in | Wt 89.0 lb

## 2019-11-06 DIAGNOSIS — I2510 Atherosclerotic heart disease of native coronary artery without angina pectoris: Secondary | ICD-10-CM | POA: Diagnosis not present

## 2019-11-06 DIAGNOSIS — R600 Localized edema: Secondary | ICD-10-CM | POA: Diagnosis not present

## 2019-11-06 DIAGNOSIS — I10 Essential (primary) hypertension: Secondary | ICD-10-CM

## 2019-11-06 NOTE — Patient Instructions (Signed)

## 2019-11-06 NOTE — Progress Notes (Signed)
Virtual Visit via Telephone Note   This visit type was conducted due to national recommendations for restrictions regarding the COVID-19 Pandemic (e.g. social distancing) in an effort to limit this patient's exposure and mitigate transmission in our community.  Due to her co-morbid illnesses, this patient is at least at moderate risk for complications without adequate follow up.  This format is felt to be most appropriate for this patient at this time.  The patient did not have access to video technology/had technical difficulties with video requiring transitioning to audio format only (telephone).  All issues noted in this document were discussed and addressed.  No physical exam could be performed with this format.  Please refer to the patient's chart for her  consent to telehealth for [facility].   The patient was identified using 2 identifiers.  Date:  11/06/2019   ID:  Candice Meza, DOB Aug 31, 1921, MRN [id number]  Patient Location: Home Provider Location: Office  PCP:  Pablo Ledger, MD  Cardiologist:  Carlyle Dolly, MD  Electrophysiologist:  None   Evaluation Performed:  Follow-Up Visit  Chief Complaint:  Follow up visit  History of Present Illness:    Candice Meza is a 86 y.o. female seen today for follow up of the following medical problems.   1. CAD  - prior MI in 1997, non-obstructive disease by cath at that time.  - echo 2008 LVEF 60%, mild AI  - has aspirin allergy. Advanced age and bleeding risk, have not had on alternative. She had admission 02/2019 with GI bleed  - difficult historian. Denies any significant exertional chest pain.   2. HTN  -she is compliant with meds  3. Hyperlipidemia  - labs followed by pcp  4. LE edema   - some LE edema at times per her report, nursing home staff reports its doing well - no SOB or DOE  5. COVID - nursing reports diagnosis of covid last year.  -nursing reports since then ongoing decline in  mental status, poor appetite, weight loss    SH: eldest son Fritz Pickerel lives in [place], [place]. John youngest son lives in [place].   The patient does not have symptoms concerning for COVID-19 infection (fever, chills, cough, or new shortness of breath).    Past Medical History:  Diagnosis Date  . Angina pectoris ([place])   . Anxiety   . Ataxic gait   . Atherosclerotic heart disease   . Atrial fibrillation ([place])   . Benign essential tremor   . COPD (chronic obstructive pulmonary disease) ([place])   . Cough    Prolonged, possible asthmatic bronchitis  . Degenerative joint disease    versus rheumatoid arthritis  . Depression   . Disease of biliary tract   . Dysphagia   . GERD (gastroesophageal reflux disease)   . Hyperlipidemia   . Hypertension   . Hypokalemia   . Macular degeneration   . MI, acute, non ST segment elevation ([place]) 1997   Questionable lesion in the circumflex at catheterization; normal EF; chronic dyspnea without specific cause identified  . Osteopenia    Versus osteoporosis  . Postmenopausal    Estrogen replacement therapy  . Repeated falls   . Weakness    Past Surgical History:  Procedure Laterality Date  . BLADDER SUSPENSION    . ERCP  04/18/2012   Procedure: ENDOSCOPIC RETROGRADE CHOLANGIOPANCREATOGRAPHY (ERCP);  Surgeon: Rogene Houston, MD;  Location: [facility];  Service: Endoscopy;  Laterality: N/A;  Diagnostic  . FOOT SURGERY  Bilateral  . INGUINAL HERNIA REPAIR     Left     No outpatient medications have been marked as taking for the 11/06/19 encounter (Appointment) with Antoine Poche, MD.     Allergies:   Aspirin, Bee venom, Morphine and related, Sulfa antibiotics, Tetracyclines & related, Codeine, Pamelor, and Penicillins   Social History   Tobacco Use  . Smoking status: Never Smoker  . Smokeless tobacco: Never Used  Substance Use Topics  . Alcohol use: No    Alcohol/week: 0.0 standard drinks  . Drug use: No     Family Hx: The  patient's family history includes Coronary artery disease in an other family member; Stroke in an other family member.  ROS:   Please see the history of present illness.     All other systems reviewed and are negative.   Prior CV studies:   The following studies were reviewed today:  10/29/13 Echo Study Conclusions  - Left ventricle: The cavity size was normal. Wall thickness was increased in a pattern of mild LVH. Systolic function was normal. The estimated ejection fraction was in the range of 55% to 60%. Wall motion was normal; there were no regional wall motion abnormalities. There was an increased relative contribution of atrial contraction to ventricular filling. Doppler parameters are consistent with abnormal left ventricular relaxation (grade 1 diastolic dysfunction). - Aortic valve: Mildly calcified annulus. Trileaflet; mildly thickened, mildly calcified leaflets. Moderate, centralregurgitation. Minimal aortic stenosis. Peak velocity: 205cm/s (S). Mean gradient: 30mm Hg (S). Valve area: 1.85cm^2(VTI). - Mitral valve: Calcified annulus. Mildly thickened leaflets . Mild regurgitation. - Left atrium: The atrium was mildly dilated. - Tricuspid valve: Mildly thickened leaflets. Mild-moderate regurgitation. - Pulmonary arteries: PA peak pressure: 65mm Hg (S). Mild to moderately elevated pulmonary pressures.   Labs/Other Tests and Data Reviewed:    EKG:  No ECG reviewed.  Recent Labs: No results found for requested labs within last 8760 hours.   Recent Lipid Panel Lab Results  Component Value Date/Time   CHOL 144 05/07/2009 09:35 PM   TRIG 117 05/07/2009 09:35 PM   HDL 54 05/07/2009 09:35 PM   CHOLHDL 2.7 Ratio 05/07/2009 09:35 PM   LDLCALC 67 05/07/2009 09:35 PM    Wt Readings from Last 3 Encounters:  11/20/18 99 lb (44.9 kg)  05/07/18 98 lb (44.5 kg)  10/31/17 104 lb (47.2 kg)     Objective:    Vital Signs:   Today's Vitals   11/06/19 0912  BP:  124/90  Pulse: 64  Weight: 89 lb (40.4 kg)  Height: 4\' 11"  (1.499 m)   Body mass index is 17.98 kg/m. Normal affect. Normal speech pattern and tone. Comfortable, no apparent distress. No audible signs of sob or wheezing.   ASSESSMENT & PLAN:    1. CAD  -no symptoms, continue to monitor - no antiplatelet due to ASA allergy, GI bleed 02/2019, advanced age.   2. HTN  -at goal, continue current therapy.    3. LE edema -chronic LE edema likely main component is venous insufficiency. - continue low dose lasix, compression stockings    F/u 6 months  COVID-19 Education: The signs and symptoms of COVID-19 were discussed with the patient and how to seek care for testing (follow up with PCP or arrange E-visit).  The importance of social distancing was discussed today.  Time:   Today, I have spent 15 minutes with the patient with telehealth technology discussing the above problems.     Medication Adjustments/Labs and Tests  Ordered: Current medicines are reviewed at length with the patient today.  Concerns regarding medicines are outlined above.   Tests Ordered: No orders of the defined types were placed in this encounter.   Medication Changes: No orders of the defined types were placed in this encounter.   Follow Up:  Either In Person or Virtual in 6 month(s)  Signed, Dina Rich, MD  11/06/2019 8:08 AM    [facility]

## 2021-04-16 ENCOUNTER — Encounter: Payer: Self-pay | Admitting: Cardiology

## 2021-04-16 ENCOUNTER — Other Ambulatory Visit: Payer: Self-pay

## 2021-04-16 ENCOUNTER — Ambulatory Visit (INDEPENDENT_AMBULATORY_CARE_PROVIDER_SITE_OTHER): Payer: Medicare Other | Admitting: Cardiology

## 2021-04-16 VITALS — BP 112/62 | HR 65 | Ht 62.0 in | Wt 85.7 lb

## 2021-04-16 DIAGNOSIS — I2510 Atherosclerotic heart disease of native coronary artery without angina pectoris: Secondary | ICD-10-CM

## 2021-04-16 DIAGNOSIS — R600 Localized edema: Secondary | ICD-10-CM | POA: Diagnosis not present

## 2021-04-16 DIAGNOSIS — I10 Essential (primary) hypertension: Secondary | ICD-10-CM | POA: Diagnosis not present

## 2021-04-16 NOTE — Progress Notes (Signed)
Clinical Summary Ms. Cronk is a 87 y.o.female seen today for follow up of the following medical problems.    1. CAD   - prior MI in 1997, non-obstructive disease by cath at that time.   - echo 2008 LVEF 60%, mild AI   - has aspirin allergy. Advanced age and bleeding risk, have not had on alternative. She had admission 02/2019 with GI bleed    - mostly bedbound - no recent chest pains. On home O2, breathing comfortable - compliant with meds   2. HTN   - appears she has been started on midodrine by another provider, remains on losartan.       3. LE edema - doing well on lasix             SH: eldest son Peyton Najjar lives in [place], [place]. John youngest son lives in [place].        Past Medical History:  Diagnosis Date   Angina pectoris (H[facility])    Anxiety    Ataxic gait    Atherosclerotic heart disease    Atrial fibrillation ([facility])    Benign essential tremor    COPD (chronic obstructive pulmonary disease) (H[facility])    Cough    Prolonged, possible asthmatic bronchitis   Degenerative joint disease    versus rheumatoid arthritis   Depression    Disease of biliary tract    Dysphagia    GERD (gastroesophageal reflux disease)    Hyperlipidemia    Hypertension    Hypokalemia    Macular degeneration    MI, acute, non ST segment elevation ([facility]) 1997   Questionable lesion in the circumflex at catheterization; normal EF; chronic dyspnea without specific cause identified   Osteopenia    Versus osteoporosis   Postmenopausal    Estrogen replacement therapy   Repeated falls    Weakness      Allergies  Allergen Reactions   Aspirin     GI symptoms   Bee Venom    Morphine And Related    Sulfa Antibiotics     dyspnea   Tetracyclines & Related    Codeine Rash   Pamelor Rash   Penicillins Rash     Current Outpatient Medications  Medication Sig Dispense Refill   acetaminophen (TYLENOL) 500 MG tablet Take 500 mg by mouth 2 (two) times daily.      Fluticasone  Furoate-Vilanterol (BREO ELLIPTA IN) Inhale into the lungs.     furosemide (LASIX) 20 MG tablet Take 20 mg by mouth daily.      levothyroxine (SYNTHROID, LEVOTHROID) 75 MCG tablet Take 75 mcg by mouth daily before breakfast.     loratadine (CLARITIN) 10 MG tablet Take 10 mg by mouth daily.     losartan (COZAAR) 25 MG tablet Take 25 mg by mouth daily.     mirtazapine (REMERON) 7.5 MG tablet Take 7.5 mg by mouth at bedtime.     pantoprazole (PROTONIX) 40 MG tablet Take 40 mg by mouth daily.     polyethylene glycol powder (GLYCOLAX/MIRALAX) powder Take 17 g by mouth daily.     potassium chloride (KLOR-CON) 20 MEQ packet Take 20 mEq by mouth every morning.     Psyllium Husk POWD Give 3.4 gram by mouth twice a day for bowel management - 1 teaspoon     traMADol (ULTRAM) 50 MG tablet Take by mouth every 6 (six) hours as needed.     venlafaxine (EFFEXOR) 75 MG tablet Take 75 mg by mouth 2 (two)  times daily with a meal.     vitamin B-12 (CYANOCOBALAMIN) 1000 MCG tablet Take 1,000 mcg by mouth daily.     No current facility-administered medications for this visit.     Past Surgical History:  Procedure Laterality Date   BLADDER SUSPENSION     ERCP  04/18/2012   Procedure: ENDOSCOPIC RETROGRADE CHOLANGIOPANCREATOGRAPHY (ERCP);  Surgeon: Malissa Hippo, MD;  Location: [facility];  Service: Endoscopy;  Laterality: N/A;  Diagnostic   FOOT SURGERY     Bilateral   INGUINAL HERNIA REPAIR     Left     Allergies  Allergen Reactions   Aspirin     GI symptoms   Bee Venom    Morphine And Related    Sulfa Antibiotics     dyspnea   Tetracyclines & Related    Codeine Rash   Pamelor Rash   Penicillins Rash      Family History  Problem Relation Age of Onset   Coronary artery disease Other    Stroke Other      Social History Ms. Savary reports that she has never smoked. She has never used smokeless tobacco. Ms. Haider reports no history of alcohol use.   Review of  Systems CONSTITUTIONAL: No weight loss, fever, chills, weakness or fatigue.  HEENT: Eyes: No visual loss, blurred vision, double vision or yellow sclerae.No hearing loss, sneezing, congestion, runny nose or sore throat.  SKIN: No rash or itching.  CARDIOVASCULAR: per hpi RESPIRATORY: No shortness of breath, cough or sputum.  GASTROINTESTINAL: No anorexia, nausea, vomiting or diarrhea. No abdominal pain or blood.  GENITOURINARY: No burning on urination, no polyuria NEUROLOGICAL: No headache, dizziness, syncope, paralysis, ataxia, numbness or tingling in the extremities. No change in bowel or bladder control.  MUSCULOSKELETAL: No muscle, back pain, joint pain or stiffness.  LYMPHATICS: No enlarged nodes. No history of splenectomy.  PSYCHIATRIC: No history of depression or anxiety.  ENDOCRINOLOGIC: No reports of sweating, cold or heat intolerance. No polyuria or polydipsia.  [name]   Physical Examination Today's Vitals   04/16/21 0933  BP: 112/62  Pulse: 65  SpO2: 95%  Weight: 85 lb 11.2 oz (38.9 kg)  Height: 5\' 2"  (1.575 m)   Body mass index is 15.67 kg/m.  Gen: elderly, frail appearing HEENT: no scleral icterus, pupils equal round and reactive, no palptable cervical adenopathy,  CV: RRR, no m/r/ gno jvd Resp: Clear to auscultation bilaterally GI: abdomen is soft, non-tender, non-distended, normal bowel sounds, no hepatosplenomegaly MSK: extremities are warm, no edema.  Skin: warm, no rash Neuro:  no focal deficits Psych: appropriate affect   Diagnostic Studies 10/29/13 Echo Study Conclusions  - Left ventricle: The cavity size was normal. Wall thickness was increased in a pattern of mild LVH. Systolic function was normal. The estimated ejection fraction was in the range of 55% to 60%. Wall motion was normal; there were no regional wall motion abnormalities. There was an increased relative contribution of atrial contraction to ventricular filling. Doppler parameters are  consistent with abnormal left ventricular relaxation (grade 1 diastolic dysfunction). - Aortic valve: Mildly calcified annulus. Trileaflet; mildly thickened, mildly calcified leaflets. Moderate, centralregurgitation. Minimal aortic stenosis. Peak velocity: 205cm/s (S). Mean gradient: 4mm Hg (S). Valve area: 1.85cm^2(VTI). - Mitral valve: Calcified annulus. Mildly thickened leaflets . Mild regurgitation. - Left atrium: The atrium was mildly dilated. - Tricuspid valve: Mildly thickened leaflets. Mild-moderate regurgitation. - Pulmonary arteries: PA peak pressure: 64mm Hg (S). Mild to moderately elevated pulmonary pressures.  Assessment and Plan   1. CAD   - no antiplatelet due to ASA allergy, GI bleed 02/2019, advanced age.  - no symptoms, continue to monitor   2. HTN   -more recent issues with low bp's, appears another provider started her on midodrine. Would stop her losartan.      3. LE edema - chronic LE edema likely main component is venous insufficiency. -controlled on lasix, request labs from pcp     Antoine Poche, M.D.

## 2021-04-16 NOTE — Patient Instructions (Signed)
Medication Instructions:  Your physician has recommended you make the following change in your medication:  Stop Losartan   *If you need a refill on your cardiac medications before your next appointment, please call your pharmacy*   Lab Work: We have requested your most recent lab work from the [facility]  If you have labs (blood work) drawn today and your tests are completely normal, you will receive your results only by: [employer] (if you have MyChart) OR A paper copy in the mail If you have any lab test that is abnormal or we need to change your treatment, we will call you to review the results.   Testing/Procedures: None   Follow-Up: At [facility], you and your health needs are our priority.  As part of our continuing mission to provide you with exceptional heart care, we have created designated Provider Care Teams.  These Care Teams include your primary Cardiologist (physician) and Advanced Practice Providers (APPs -  Physician Assistants and Nurse Practitioners) who all work together to provide you with the care you need, when you need it.  We recommend signing up for the patient portal called "MyChart".  Sign up information is provided on this After Visit Summary.  MyChart is used to connect with patients for Virtual Visits (Telemedicine).  Patients are able to view lab/test results, encounter notes, upcoming appointments, etc.  Non-urgent messages can be sent to your provider as well.   To learn more about what you can do with MyChart, go to [email or web address].    Your next appointment:   6 month(s)  The format for your next appointment:   In Person  Provider:   Dina Rich, MD   Other Instructions

## 2021-07-04 DEATH — deceased
